# Patient Record
Sex: Female | Born: 1935 | Race: Black or African American | Hispanic: No | Marital: Single | Smoking: Former smoker
Health system: Southern US, Community
[De-identification: ages and names within clinical notes are randomized; demographics above are authoritative.]

## PROBLEM LIST (undated history)

## (undated) DIAGNOSIS — J45909 Unspecified asthma, uncomplicated: Secondary | ICD-10-CM

## (undated) DIAGNOSIS — I480 Paroxysmal atrial fibrillation: Secondary | ICD-10-CM

## (undated) DIAGNOSIS — I779 Disorder of arteries and arterioles, unspecified: Secondary | ICD-10-CM

## (undated) DIAGNOSIS — T7840XA Allergy, unspecified, initial encounter: Secondary | ICD-10-CM

## (undated) DIAGNOSIS — I10 Essential (primary) hypertension: Secondary | ICD-10-CM

## (undated) DIAGNOSIS — E785 Hyperlipidemia, unspecified: Secondary | ICD-10-CM

## (undated) HISTORY — DX: Paroxysmal atrial fibrillation: I48.0

## (undated) HISTORY — PX: GROIN DISSECTION: SUR421

## (undated) HISTORY — DX: Disorder of arteries and arterioles, unspecified: I77.9

## (undated) HISTORY — PX: INSERT / REPLACE / REMOVE PACEMAKER: SUR710

## (undated) HISTORY — PX: OTHER SURGICAL HISTORY: SHX169

---

## 2019-09-17 ENCOUNTER — Other Ambulatory Visit: Payer: Self-pay

## 2019-09-17 ENCOUNTER — Inpatient Hospital Stay (HOSPITAL_COMMUNITY)
Admission: EM | Admit: 2019-09-17 | Discharge: 2019-09-23 | DRG: 418 | Disposition: A | Discharge status: Home Health | Payer: Medicare Other | Attending: Family Medicine | Admitting: Family Medicine

## 2019-09-17 ENCOUNTER — Emergency Department (HOSPITAL_COMMUNITY): Payer: Medicare Other

## 2019-09-17 DIAGNOSIS — D638 Anemia in other chronic diseases classified elsewhere: Secondary | ICD-10-CM | POA: Diagnosis present

## 2019-09-17 DIAGNOSIS — Z20822 Contact with and (suspected) exposure to covid-19: Secondary | ICD-10-CM | POA: Diagnosis present

## 2019-09-17 DIAGNOSIS — E876 Hypokalemia: Secondary | ICD-10-CM | POA: Diagnosis present

## 2019-09-17 DIAGNOSIS — E785 Hyperlipidemia, unspecified: Secondary | ICD-10-CM | POA: Diagnosis present

## 2019-09-17 DIAGNOSIS — K567 Ileus, unspecified: Secondary | ICD-10-CM | POA: Diagnosis not present

## 2019-09-17 DIAGNOSIS — K810 Acute cholecystitis: Principal | ICD-10-CM | POA: Diagnosis present

## 2019-09-17 DIAGNOSIS — J45909 Unspecified asthma, uncomplicated: Secondary | ICD-10-CM | POA: Diagnosis present

## 2019-09-17 DIAGNOSIS — J4520 Mild intermittent asthma, uncomplicated: Secondary | ICD-10-CM | POA: Diagnosis present

## 2019-09-17 DIAGNOSIS — D649 Anemia, unspecified: Secondary | ICD-10-CM | POA: Diagnosis present

## 2019-09-17 DIAGNOSIS — I10 Essential (primary) hypertension: Secondary | ICD-10-CM | POA: Diagnosis present

## 2019-09-17 DIAGNOSIS — T7840XA Allergy, unspecified, initial encounter: Secondary | ICD-10-CM | POA: Diagnosis present

## 2019-09-17 DIAGNOSIS — E739 Lactose intolerance, unspecified: Secondary | ICD-10-CM | POA: Diagnosis present

## 2019-09-17 DIAGNOSIS — Z6832 Body mass index (BMI) 32.0-32.9, adult: Secondary | ICD-10-CM

## 2019-09-17 DIAGNOSIS — Z950 Presence of cardiac pacemaker: Secondary | ICD-10-CM

## 2019-09-17 DIAGNOSIS — E669 Obesity, unspecified: Secondary | ICD-10-CM | POA: Diagnosis present

## 2019-09-17 DIAGNOSIS — R350 Frequency of micturition: Secondary | ICD-10-CM | POA: Diagnosis present

## 2019-09-17 DIAGNOSIS — Z87891 Personal history of nicotine dependence: Secondary | ICD-10-CM

## 2019-09-17 DIAGNOSIS — E871 Hypo-osmolality and hyponatremia: Secondary | ICD-10-CM | POA: Diagnosis present

## 2019-09-17 DIAGNOSIS — L8990 Pressure ulcer of unspecified site, unspecified stage: Secondary | ICD-10-CM | POA: Insufficient documentation

## 2019-09-17 DIAGNOSIS — L89302 Pressure ulcer of unspecified buttock, stage 2: Secondary | ICD-10-CM | POA: Diagnosis present

## 2019-09-17 DIAGNOSIS — Z01810 Encounter for preprocedural cardiovascular examination: Secondary | ICD-10-CM | POA: Diagnosis not present

## 2019-09-17 HISTORY — DX: Acute cholecystitis: K81.0

## 2019-09-17 HISTORY — DX: Essential (primary) hypertension: I10

## 2019-09-17 HISTORY — DX: Unspecified asthma, uncomplicated: J45.909

## 2019-09-17 HISTORY — DX: Allergy, unspecified, initial encounter: T78.40XA

## 2019-09-17 HISTORY — DX: Hyperlipidemia, unspecified: E78.5

## 2019-09-17 LAB — BASIC METABOLIC PANEL
Anion gap: 11 (ref 5–15)
BUN: 20 mg/dL (ref 8–23)
CO2: 22 mmol/L (ref 22–32)
Calcium: 8.7 mg/dL — ABNORMAL LOW (ref 8.9–10.3)
Chloride: 94 mmol/L — ABNORMAL LOW (ref 98–111)
Creatinine, Ser: 1.13 mg/dL — ABNORMAL HIGH (ref 0.44–1.00)
GFR calc Af Amer: 49 mL/min — ABNORMAL LOW (ref >60)
GFR calc non Af Amer: 42 mL/min — ABNORMAL LOW (ref >60)
Glucose, Bld: 112 mg/dL — ABNORMAL HIGH (ref 70–99)
Potassium: 4.5 mmol/L (ref 3.5–5.1)
Sodium: 127 mmol/L — ABNORMAL LOW (ref 135–145)

## 2019-09-17 LAB — CBC WITH DIFFERENTIAL/PLATELET
Abs Immature Granulocytes: 0.18 10*3/uL — ABNORMAL HIGH (ref 0.00–0.07)
Basophils Absolute: 0 10*3/uL (ref 0.0–0.1)
Basophils Relative: 0 %
Eosinophils Absolute: 0 10*3/uL (ref 0.0–0.5)
Eosinophils Relative: 0 %
HCT: 34.3 % — ABNORMAL LOW (ref 36.0–46.0)
Hemoglobin: 11.6 g/dL — ABNORMAL LOW (ref 12.0–15.0)
Immature Granulocytes: 1 %
Lymphocytes Relative: 3 %
Lymphs Abs: 0.7 10*3/uL (ref 0.7–4.0)
MCH: 30.8 pg (ref 26.0–34.0)
MCHC: 33.8 g/dL (ref 30.0–36.0)
MCV: 91 fL (ref 80.0–100.0)
Monocytes Absolute: 0.9 10*3/uL (ref 0.1–1.0)
Monocytes Relative: 4 %
Neutro Abs: 20 10*3/uL — ABNORMAL HIGH (ref 1.7–7.7)
Neutrophils Relative %: 92 %
Platelets: 295 10*3/uL (ref 150–400)
RBC: 3.77 MIL/uL — ABNORMAL LOW (ref 3.87–5.11)
RDW: 12.8 % (ref 11.5–15.5)
WBC: 21.8 10*3/uL — ABNORMAL HIGH (ref 4.0–10.5)
nRBC: 0 % (ref 0.0–0.2)

## 2019-09-17 LAB — COMPREHENSIVE METABOLIC PANEL
ALT: 19 U/L (ref 0–44)
AST: 24 U/L (ref 15–41)
Albumin: 3.5 g/dL (ref 3.5–5.0)
Alkaline Phosphatase: 64 U/L (ref 38–126)
Anion gap: 13 (ref 5–15)
BUN: 17 mg/dL (ref 8–23)
CO2: 22 mmol/L (ref 22–32)
Calcium: 9.4 mg/dL (ref 8.9–10.3)
Chloride: 91 mmol/L — ABNORMAL LOW (ref 98–111)
Creatinine, Ser: 1.09 mg/dL — ABNORMAL HIGH (ref 0.44–1.00)
GFR calc Af Amer: 51 mL/min — ABNORMAL LOW (ref >60)
GFR calc non Af Amer: 44 mL/min — ABNORMAL LOW (ref >60)
Glucose, Bld: 161 mg/dL — ABNORMAL HIGH (ref 70–99)
Potassium: 4.1 mmol/L (ref 3.5–5.1)
Sodium: 126 mmol/L — ABNORMAL LOW (ref 135–145)
Total Bilirubin: 0.8 mg/dL (ref 0.3–1.2)
Total Protein: 6.8 g/dL (ref 6.5–8.1)

## 2019-09-17 LAB — OSMOLALITY, URINE: Osmolality, Ur: 632 mOsm/kg (ref 300–900)

## 2019-09-17 LAB — SARS CORONAVIRUS 2 BY RT PCR (HOSPITAL ORDER, PERFORMED IN ~~LOC~~ HOSPITAL LAB): SARS Coronavirus 2: NEGATIVE

## 2019-09-17 LAB — SODIUM, URINE, RANDOM: Sodium, Ur: 11 mmol/L

## 2019-09-17 LAB — OSMOLALITY: Osmolality: 272 mOsm/kg — ABNORMAL LOW (ref 275–295)

## 2019-09-17 LAB — LIPASE, BLOOD: Lipase: 27 U/L (ref 11–51)

## 2019-09-17 MED ORDER — IOHEXOL 300 MG/ML  SOLN
100.0000 mL | Freq: Once | INTRAMUSCULAR | Status: AC | PRN
  Administered 2019-09-17: 100 mL via INTRAVENOUS

## 2019-09-17 MED ORDER — ONDANSETRON HCL 4 MG/2ML IJ SOLN
4.0000 mg | Freq: Four times a day (QID) | INTRAMUSCULAR | Status: DC | PRN
  Administered 2019-09-17 – 2019-09-22 (×3): 4 mg via INTRAVENOUS
  Filled 2019-09-17 (×3): qty 2

## 2019-09-17 MED ORDER — ACETAMINOPHEN 325 MG PO TABS: 650.0000 mg | ORAL_TABLET | Freq: Four times a day (QID) | ORAL | Status: DC | PRN

## 2019-09-17 MED ORDER — MOMETASONE FURO-FORMOTEROL FUM 200-5 MCG/ACT IN AERO
2.0000 | INHALATION_SPRAY | Freq: Two times a day (BID) | RESPIRATORY_TRACT | Status: DC
  Administered 2019-09-17 – 2019-09-23 (×12): 2 via RESPIRATORY_TRACT
  Filled 2019-09-17: qty 8.8

## 2019-09-17 MED ORDER — SODIUM CHLORIDE 0.9 % IV SOLN
2.0000 g | Freq: Once | INTRAVENOUS | Status: AC
  Administered 2019-09-17: 2 g via INTRAVENOUS
  Filled 2019-09-17: qty 20

## 2019-09-17 MED ORDER — SODIUM CHLORIDE 0.9 % IV SOLN
2.0000 g | INTRAVENOUS | Status: DC
  Administered 2019-09-18 – 2019-09-23 (×6): 2 g via INTRAVENOUS
  Filled 2019-09-17 (×2): qty 2
  Filled 2019-09-17 (×4): qty 20
  Filled 2019-09-17: qty 2

## 2019-09-17 MED ORDER — ACETAMINOPHEN 650 MG RE SUPP: 650.0000 mg | Freq: Four times a day (QID) | RECTAL | Status: DC | PRN

## 2019-09-17 MED ORDER — DICLOFENAC SODIUM 1 % EX GEL
2.0000 g | Freq: Two times a day (BID) | CUTANEOUS | Status: DC | PRN
  Filled 2019-09-17: qty 100

## 2019-09-17 MED ORDER — AMLODIPINE BESYLATE 10 MG PO TABS
10.0000 mg | ORAL_TABLET | Freq: Every day | ORAL | Status: DC
  Administered 2019-09-17 – 2019-09-23 (×7): 10 mg via ORAL
  Filled 2019-09-17 (×7): qty 1

## 2019-09-17 MED ORDER — ENOXAPARIN SODIUM 30 MG/0.3ML ~~LOC~~ SOLN
30.0000 mg | SUBCUTANEOUS | Status: DC
  Administered 2019-09-18: 30 mg via SUBCUTANEOUS
  Filled 2019-09-17: qty 0.3

## 2019-09-17 MED ORDER — ONDANSETRON HCL 4 MG PO TABS: 4.0000 mg | ORAL_TABLET | Freq: Four times a day (QID) | ORAL | Status: DC | PRN

## 2019-09-17 MED ORDER — FENTANYL CITRATE (PF) 100 MCG/2ML IJ SOLN
12.5000 ug | INTRAMUSCULAR | Status: DC | PRN
  Administered 2019-09-19 – 2019-09-21 (×3): 12.5 ug via INTRAVENOUS
  Filled 2019-09-17 (×3): qty 2

## 2019-09-17 MED ORDER — ENOXAPARIN SODIUM 40 MG/0.4ML ~~LOC~~ SOLN
40.0000 mg | SUBCUTANEOUS | Status: DC
  Administered 2019-09-17: 40 mg via SUBCUTANEOUS
  Filled 2019-09-17: qty 0.4

## 2019-09-17 MED ORDER — SODIUM CHLORIDE 0.9 % IV SOLN: INTRAVENOUS | Status: DC

## 2019-09-17 NOTE — ED Notes (Signed)
Medicare card handed to Pt's Daughter  (Debora ) .

## 2019-09-17 NOTE — H&P (Signed)
History and Physical    Sarah Burns JWJ:[id number] DOB: Apr 25, 1924 DOA: 09/17/2019  I have briefly reviewed the patient's prior medical records in [facility]  PCP: Renaye Rakers, MD  Patient coming from: home with daughter  Chief Complaint: stomach troubles  HPI: Sarah Burns is a 86 y.o. female who for her age is in remarkable shape: reported pMHX of HTN, HLD, asthma, and pacemaker.  Until recently patient has been living in [place] on her.  Patient has now moved to [place] to be with her daughter.  Patient stated she developed abdominal pain on Friday night after having food from [employer] including a milkshake (patient states she is lactose intolerant).  Patient has continued to have abdominal issues including pain, nausea, and vomiting so she reported to the ER. In the ER, CT scan was done that shows acute cholecystitis.  Patient also has a 1 cm stone in the cystic duct.  Patient is white blood cell count was found to be markedly elevated at 21.9 and her sodium was 126.    Review of Systems: As per HPI otherwise 10 point review of systems negative.   No past medical history on file.- lactose intolerant, HTN, HLD, asthma, s/p pacemaker (patient is from the [place] area and daughter is not at bedside for verification.  Have left a message with daughter to see if we can import records from her prior hospitalizations in [place])     has no history on file for tobacco, alcohol, and drug.  Allergies  Allergen Reactions   Penicillins Other (See Comments)    Unknown reaction     Patient denies pertinent family history  Prior to Admission medications   Not on File    Physical Exam: Vitals:   09/17/19 0500 09/17/19 0515 09/17/19 0530 09/17/19 0724  BP: (!) 166/55 (!) 165/51 (!) 148/49 (!) 141/51  Pulse: 81 80 78 79  Resp:    17  SpO2: 96% 97% 97% 97%  Weight:      Height:          Constitutional: NAD, calm, comfortable Eyes: PERRL, lids and  conjunctivae normal ENMT: Dry mucous membranes Respiratory: clear to auscultation bilaterally, no wheezing, no crackles. Normal respiratory effort. No accessory muscle use.  Cardiovascular: Regular rate and rhythm, no murmurs / rubs / gallops. No extremity edema. 2+ pedal pulses.  Abdomen: Tenderness in the right upper quadrant and epigastric area with palpation. Bowel sounds positive.  Musculoskeletal: no clubbing / cyanosis. Skin: no rashes, lesions, ulcers. No induration Neurologic: CN 2-12 grossly intact.  Moves all 4 extremities Psychiatric: Normal judgment and insight. Alert and oriented x 3. Normal mood.   Labs on Admission: I have personally reviewed following labs and imaging studies  CBC: Recent Labs  Lab 09/17/19 0346  WBC 21.8*  NEUTROABS 20.0*  HGB 11.6*  HCT 34.3*  MCV 91.0  PLT 295   Basic Metabolic Panel: Recent Labs  Lab 09/17/19 0346  NA 126*  K 4.1  CL 91*  CO2 22  GLUCOSE 161*  BUN 17  CREATININE 1.09*  CALCIUM 9.4   GFR: Estimated Creatinine Clearance: 18.6 mL/min (A) (by C-G formula based on SCr of 1.09 mg/dL (H)). Liver Function Tests: Recent Labs  Lab 09/17/19 0346  AST 24  ALT 19  ALKPHOS 64  BILITOT 0.8  PROT 6.8  ALBUMIN 3.5   Recent Labs  Lab 09/17/19 0346  LIPASE 27   No results for input(s): AMMONIA in the last 168 hours. Coagulation  Profile: No results for input(s): INR, PROTIME in the last 168 hours. Cardiac Enzymes: No results for input(s): CKTOTAL, CKMB, CKMBINDEX, TROPONINI in the last 168 hours. BNP (last 3 results) No results for input(s): PROBNP in the last 8760 hours. HbA1C: No results for input(s): HGBA1C in the last 72 hours. CBG: No results for input(s): GLUCAP in the last 168 hours. Lipid Profile: No results for input(s): CHOL, HDL, LDLCALC, TRIG, CHOLHDL, LDLDIRECT in the last 72 hours. Thyroid Function Tests: No results for input(s): TSH, T4TOTAL, FREET4, T3FREE, THYROIDAB in the last 72 hours. Anemia  Panel: No results for input(s): VITAMINB12, FOLATE, FERRITIN, TIBC, IRON, RETICCTPCT in the last 72 hours. Urine analysis: No results found for: COLORURINE, APPEARANCEUR, LABSPEC, PHURINE, GLUCOSEU, HGBUR, BILIRUBINUR, KETONESUR, PROTEINUR, UROBILINOGEN, NITRITE, LEUKOCYTESUR   Radiological Exams on Admission: CT ABDOMEN PELVIS W CONTRAST  Result Date: 09/17/2019 CLINICAL DATA:  Pt. Is lactose intolerant and had ice cream and a little wine. Per EMS, pt. stomach started hurting and she started feeling sick. EXAM: CT ABDOMEN AND PELVIS WITH CONTRAST TECHNIQUE: Multidetector CT imaging of the abdomen and pelvis was performed using the standard protocol following bolus administration of intravenous contrast. CONTRAST:  OMNIPAQUE IOHEXOL 300 MG/ML  SOLN COMPARISON:  None. FINDINGS: Lower chest: Linear and reticular lower lobe opacities are noted consistent with chronic scarring/atelectasis. No acute lung base abnormalities. Hepatobiliary: 1 cm focus of enhancement in segment 7, nonspecific. No other liver mass or lesion. Liver normal in size and overall attenuation. Gallbladder is distended with wall thickening and adjacent inflammation. There is a 1 cm stone in the cystic duct. Common bile duct is normal in caliber for age. Pancreas: Unremarkable. No pancreatic ductal dilatation or surrounding inflammatory changes. Spleen: Normal in size without focal abnormality. Adrenals/Urinary Tract: No adrenal masses. Kidneys normal in size, orientation and position with symmetric enhancement and excretion. No renal masses, stones or hydronephrosis. Normal ureters. Normal bladder. Stomach/Bowel: Normal stomach. Small bowel and colon are normal in caliber. No wall thickening. No inflammation. Normal appendix visualized. Vascular/Lymphatic: Extensive aortic and iliac artery atherosclerosis. No aneurysm. No enlarged lymph nodes. Reproductive: Uterus and bilateral adnexa are unremarkable. Other: Trace amount of ascites,  with fluid attenuation adjacent to the gallbladder and collecting in the posterior pelvic recess. Musculoskeletal: No fracture or acute finding. No osteoblastic or osteolytic lesions. IMPRESSION: 1. Acute cholecystitis. Gallbladder is distended with wall thickening and adjacent inflammation. There is a 1 cm stone within the cystic duct. Trace amount of associated ascites. 2. No other acute abnormality within the abdomen or pelvis. 3. Extensive aortoiliac atherosclerosis. Electronically Signed   By: Amie Portland M.D.   On: 09/17/2019 06:29    EKG: Independently reviewed. I have ordered  Assessment/Plan Active Problems:   Acute cholecystitis    Acute cholecystitis -IV Abx -General surgery consultation: Will need to be decided if patient to go to operating room versus IR for drain -Have left message for daughter to clarify past medical history and to see if her old records from [place] can be pulled into chart through care everywhere -We will get echocardiogram and EKG to review prior to procedures as no records here   Hyponatremia -Most likely from vomiting -IV fluids -Have ordered urine studies as well as serum Osmo  Leukocytosis -Trend with daily CBCs -On antibiotics  Hypertension -Home meds have not been reconciled -Continue to monitor  DVT prophylaxis: lovenox  Code Status: full (discussed with patient but no family around)  Family Communication: LM for daughter Disposition  Plan: inpt Consults called: general surgery    Admission status: inpt   At the time of admission, it appears that the appropriate admission status for this patient is INPATIENT. This is judged to be reasonable and necessary in order to provide the required high service intensity to ensure the patient's safety given the presenting symptoms, physical exam findings, and initial radiographic and laboratory data in the context of their chronic comorbidities. Current circumstances are an acute infection  and diminished PO status, and it is felt to place patient at high risk for further clinical deterioration threatening life, limb, or organ. Moreover, it is my clinical judgment that the patient will require inpatient hospital care spanning beyond 2 midnights from the point of admission and that early discharge would result in unnecessary risk of decompensation and readmission or threat to life, limb or bodily function.   Geradine Girt [facility]   How to contact the [facility] Attending or Consulting provider [place] or covering provider during after hours [place], for this patient?  1. Check the care team in [facility] and look for a) attending/consulting TR[facility] provider listed and b) the [facility] team listed 2. Log into [email or web address] and use [place]'s universal password to access. If you do not have the password, please contact the hospital operator. 3. Locate the [facility] provider you are looking for under [facility] and page to a number that you can be directly reached. 4. If you still have difficulty reaching the provider, please page the [facility] (Director on Call) for the Hospitalists listed on amion for assistance.  09/17/2019, 7:36 AM

## 2019-09-17 NOTE — Consult Note (Signed)
[facility] Surgery Consult Note  Sarah Burns 07/25/1924  [id number].    Requesting MD: Dione Booze Chief Complaint/Reason for Consult: cholecystitis  HPI:  Sarah Burns is a 86yo female PMH HTN, HLD, asthma, s/p pacemaker who presented to [facility] early this morning with worsening abdominal pain. States that it started Friday night after eating chicken salad sandwich and milkshake from [employer]. Initially thought that it was from the dairy because she is lactose intolerant. Pain is central. Associated with anorexia and multiple episodes of nausea and vomiting. Denies fever, chills, diarrhea, dysuria, CP, or SOB. She has never had pain like this before.   ED work up included CT scan which shows acute cholecystitis, gallbladder is distended with wall thickening and adjacent inflammation, there is a 1 cm stone within the cystic duct and trace amount of associated ascites. WBC 21.9, lipase and LFTs WNL, Cr 1.09, Na 126, Cl 91. Temp 99.6. General surgery asked to see.  Abdominal surgical history: none Anticoagulants: none Nonsmoker Lives at home with her daughter, recently moved from [place] where she lived independently  Ambulates with a cane, fairly sedentary throughout the day  Review of Systems  Constitutional: Negative.   HENT: Negative.   Eyes: Negative.   Respiratory: Negative.   Cardiovascular: Negative.   Gastrointestinal: Positive for abdominal pain, nausea and vomiting. Negative for constipation and diarrhea.  Genitourinary: Negative.   Musculoskeletal: Negative.   Skin: Negative.   Neurological: Negative.    All systems reviewed and otherwise negative except for as above  No family history on file.  No past medical history on file.  Social History:  has no history on file for tobacco, alcohol, and drug.  Allergies:  Allergies  Allergen Reactions  . Penicillins Other (See Comments)    Unknown reaction     (Not in a hospital admission)   Prior to  Admission medications   Not on File    Blood pressure (!) 141/51, pulse 79, temperature 99.6 F (37.6 C), temperature source Oral, resp. rate 17, height 4\' 3"  (1.295 m), weight 54.4 kg, SpO2 97 %. Physical Exam: General: pleasant, frail black female who is laying in bed in NAD HEENT: head is normocephalic, atraumatic.  Sclera are noninjected.  PERRL.  Ears and nose without any masses or lesions.  Mouth is pink and moist. Dentition fair Heart: regular, rate, and rhythm.  Normal s1,s2. No obvious murmurs, gallops, or rubs noted.  Feet WWP bilaterally but pedal pulses difficult to palpate Lungs: CTAB, no wheezes, rhonchi, or rales noted.  Respiratory effort nonlabored Abd: soft, ND, +BS, no masses, hernias, or organomegaly. Mild/mod TTP RUQ and epigastric region without rebound or guarding MS: no BUE/BLE edema, calves soft and nontender Skin: warm and dry with no masses, lesions, or rashes Psych: A&Ox4 with an appropriate affect Neuro: cranial nerves grossly intact, equal strength in BUE/BLE bilaterally, normal speech, thought process intact  Results for orders placed or performed during the hospital encounter of 09/17/19 (from the past 48 hour(s))  Comprehensive metabolic panel     Status: Abnormal   Collection Time: 09/17/19  3:46 AM  Result Value Ref Range   Sodium 126 (L) 135 - 145 mmol/L   Potassium 4.1 3.5 - 5.1 mmol/L   Chloride 91 (L) 98 - 111 mmol/L   CO2 22 22 - 32 mmol/L   Glucose, Bld 161 (H) 70 - 99 mg/dL    Comment: Glucose reference range applies only to samples taken after fasting for at least 8 hours.  BUN 17 8 - 23 mg/dL   Creatinine, Ser 5.91 (H) 0.44 - 1.00 mg/dL   Calcium 9.4 8.9 - 63.8 mg/dL   Total Protein 6.8 6.5 - 8.1 g/dL   Albumin 3.5 3.5 - 5.0 g/dL   AST 24 15 - 41 U/L   ALT 19 0 - 44 U/L   Alkaline Phosphatase 64 38 - 126 U/L   Total Bilirubin 0.8 0.3 - 1.2 mg/dL   GFR calc non Af Amer 44 (L) >60 mL/min   GFR calc Af Amer 51 (L) >60 mL/min   Anion gap  13 5 - 15    Comment: Performed at [facility], [address] [place], [place] [address]  Lipase, blood     Status: None   Collection Time: 09/17/19  3:46 AM  Result Value Ref Range   Lipase 27 11 - 51 U/L    Comment: Performed at [facility], [address], [place], [place] [address]  CBC with Differential     Status: Abnormal   Collection Time: 09/17/19  3:46 AM  Result Value Ref Range   WBC 21.8 (H) 4.0 - 10.5 K/uL   RBC 3.77 (L) 3.87 - 5.11 MIL/uL   Hemoglobin 11.6 (L) 12.0 - 15.0 g/dL   HCT 17.7 (L) 93.9 - 03.0 %   MCV 91.0 80.0 - 100.0 fL   MCH 30.8 26.0 - 34.0 pg   MCHC 33.8 30.0 - 36.0 g/dL   RDW 09.2 33.0 - 07.6 %   Platelets 295 150 - 400 K/uL   nRBC 0.0 0.0 - 0.2 %   Neutrophils Relative % 92 %   Neutro Abs 20.0 (H) 1.7 - 7.7 K/uL   Lymphocytes Relative 3 %   Lymphs Abs 0.7 0.7 - 4.0 K/uL   Monocytes Relative 4 %   Monocytes Absolute 0.9 0.1 - 1.0 K/uL   Eosinophils Relative 0 %   Eosinophils Absolute 0.0 0.0 - 0.5 K/uL   Basophils Relative 0 %   Basophils Absolute 0.0 0.0 - 0.1 K/uL   Immature Granulocytes 1 %   Abs Immature Granulocytes 0.18 (H) 0.00 - 0.07 K/uL    Comment: Performed at [facility], [address], [place], [place] [address]   CT ABDOMEN PELVIS W CONTRAST  Result Date: 09/17/2019 CLINICAL DATA:  Pt. Is lactose intolerant and had ice cream and a little wine. Per EMS, pt. stomach started hurting and she started feeling sick. EXAM: CT ABDOMEN AND PELVIS WITH CONTRAST TECHNIQUE: Multidetector CT imaging of the abdomen and pelvis was performed using the standard protocol following bolus administration of intravenous contrast. CONTRAST:  OMNIPAQUE IOHEXOL 300 MG/ML  SOLN COMPARISON:  None. FINDINGS: Lower chest: Linear and reticular lower lobe opacities are noted consistent with chronic scarring/atelectasis. No acute lung base abnormalities. Hepatobiliary: 1 cm focus of enhancement in segment 7, nonspecific. No other liver mass  or lesion. Liver normal in size and overall attenuation. Gallbladder is distended with wall thickening and adjacent inflammation. There is a 1 cm stone in the cystic duct. Common bile duct is normal in caliber for age. Pancreas: Unremarkable. No pancreatic ductal dilatation or surrounding inflammatory changes. Spleen: Normal in size without focal abnormality. Adrenals/Urinary Tract: No adrenal masses. Kidneys normal in size, orientation and position with symmetric enhancement and excretion. No renal masses, stones or hydronephrosis. Normal ureters. Normal bladder. Stomach/Bowel: Normal stomach. Small bowel and colon are normal in caliber. No wall thickening. No inflammation. Normal appendix visualized. Vascular/Lymphatic:  Extensive aortic and iliac artery atherosclerosis. No aneurysm. No enlarged lymph nodes. Reproductive: Uterus and bilateral adnexa are unremarkable. Other: Trace amount of ascites, with fluid attenuation adjacent to the gallbladder and collecting in the posterior pelvic recess. Musculoskeletal: No fracture or acute finding. No osteoblastic or osteolytic lesions. IMPRESSION: 1. Acute cholecystitis. Gallbladder is distended with wall thickening and adjacent inflammation. There is a 1 cm stone within the cystic duct. Trace amount of associated ascites. 2. No other acute abnormality within the abdomen or pelvis. 3. Extensive aortoiliac atherosclerosis. Electronically Signed   By: Lajean Manes M.D.   On: 09/17/2019 06:29      Assessment/Plan HTN HLD Asthma S/p pacemaker  Acute cholecystitis  - Patient with acute cholecystitis and WBC 21.8. LFTs are WNL and her vital signs are stable. She is tender RUQ/epigastric region on exam. We discussed treatment options including cholecystectomy, percutaneous cholecystostomy tube, versus antibiotics alone. Also discussed with Dr. Eliseo Squires who will obtain further work up included EKG and ECHO to determine if she is a candidate for surgery, and will see if  we can obtain any medical records from [place] where she recently moved from. Keep NPO for now and continue IV rocephin.   ID - rocephin VTE - SCDs FEN - IVF, NPO Foley - none Follow up - TBD  [place]ellington [place], [facility] Surgery 09/17/2019, 8:02 AM Please see Amion for pager number during day hours 7:00am-4:30pm

## 2019-09-17 NOTE — ED Notes (Signed)
Report given to 5c 

## 2019-09-17 NOTE — ED Provider Notes (Signed)
[facility] EMERGENCY DEPARTMENT Provider Note   CSN: [id number] Arrival date & time: 09/17/19  0247   History Chief Complaint  Patient presents with  . Emesis    Sarah Burns is a 86 y.o. female.  The history is provided by the patient and the EMS personnel.  Emesis She is lactose intolerance and apparently developed some abdominal cramping after eating some ice cream and drinking a little bit of wine.  There is no nausea, vomiting, diarrhea.  Cramping has subsided, but patient's daughter wanted her to be evaluated.  She currently has no complaints.  No past medical history on file.  There are no problems to display for this patient.   ** The histories are not reviewed yet. Please review them in the "History" navigator section and refresh this SmartLink.   OB History   No obstetric history on file.     No family history on file.  Social History   Tobacco Use  . Smoking status: Not on file  Substance Use Topics  . Alcohol use: Not on file  . Drug use: Not on file    Home Medications Prior to Admission medications   Not on File    Allergies    Patient has no allergy information on record.  Review of Systems   Review of Systems  Gastrointestinal: Positive for vomiting.  All other systems reviewed and are negative.   Physical Exam Updated Vital Signs BP (!) 154/49 (BP Location: Right Arm)   Pulse 88   Resp 17   Ht 4\' 3"  (1.295 m)   SpO2 100%   Physical Exam Vitals and nursing note reviewed.   86 year old female, resting comfortably and in no acute distress. Vital signs are significant for elevated blood pressure. Oxygen saturation is 100%, which is normal. Head is normocephalic and atraumatic. PERRLA, EOMI. Oropharynx is clear. Neck is nontender and supple without adenopathy or JVD. Back is nontender and there is no CVA tenderness. Lungs are clear without rales, wheezes, or rhonchi. Chest is nontender. Heart has regular rate and  rhythm without murmur. Abdomen is soft, flat, nontender without masses or hepatosplenomegaly and peristalsis is hypoactive. Extremities have no cyanosis or edema, full range of motion is present. Skin is warm and dry without rash. Neurologic: Mental status is normal, cranial nerves are intact, there are no motor or sensory deficits.  ED Results / Procedures / Treatments   Labs (all labs ordered are listed, but only abnormal results are displayed) Labs Reviewed  COMPREHENSIVE METABOLIC PANEL - Abnormal; Notable for the following components:      Result Value   Sodium 126 (*)    Chloride 91 (*)    Glucose, Bld 161 (*)    Creatinine, Ser 1.09 (*)    GFR calc non Af Amer 44 (*)    GFR calc Af Amer 51 (*)    All other components within normal limits  CBC WITH DIFFERENTIAL/PLATELET - Abnormal; Notable for the following components:   WBC 21.8 (*)    RBC 3.77 (*)    Hemoglobin 11.6 (*)    HCT 34.3 (*)    Neutro Abs 20.0 (*)    Abs Immature Granulocytes 0.18 (*)    All other components within normal limits  SARS CORONAVIRUS 2 BY RT PCR (HOSPITAL ORDER, PERFORMED IN [facility])  LIPASE, BLOOD   Imaging Results CT ABDOMEN PELVIS W CONTRAST  Result Date: 09/17/2019 CLINICAL DATA:  Pt. Is lactose intolerant and had  ice cream and a little wine. Per EMS, pt. stomach started hurting and she started feeling sick. EXAM: CT ABDOMEN AND PELVIS WITH CONTRAST TECHNIQUE: Multidetector CT imaging of the abdomen and pelvis was performed using the standard protocol following bolus administration of intravenous contrast. CONTRAST:  166mL OMNIPAQUE IOHEXOL 300 MG/ML  SOLN COMPARISON:  None. FINDINGS: Lower chest: Linear and reticular lower lobe opacities are noted consistent with chronic scarring/atelectasis. No acute lung base abnormalities. Hepatobiliary: 1 cm focus of enhancement in segment 7, nonspecific. No other liver mass or lesion. Liver normal in size and overall attenuation. Gallbladder  is distended with wall thickening and adjacent inflammation. There is a 1 cm stone in the cystic duct. Common bile duct is normal in caliber for age. Pancreas: Unremarkable. No pancreatic ductal dilatation or surrounding inflammatory changes. Spleen: Normal in size without focal abnormality. Adrenals/Urinary Tract: No adrenal masses. Kidneys normal in size, orientation and position with symmetric enhancement and excretion. No renal masses, stones or hydronephrosis. Normal ureters. Normal bladder. Stomach/Bowel: Normal stomach. Small bowel and colon are normal in caliber. No wall thickening. No inflammation. Normal appendix visualized. Vascular/Lymphatic: Extensive aortic and iliac artery atherosclerosis. No aneurysm. No enlarged lymph nodes. Reproductive: Uterus and bilateral adnexa are unremarkable. Other: Trace amount of ascites, with fluid attenuation adjacent to the gallbladder and collecting in the posterior pelvic recess. Musculoskeletal: No fracture or acute finding. No osteoblastic or osteolytic lesions. IMPRESSION: 1. Acute cholecystitis. Gallbladder is distended with wall thickening and adjacent inflammation. There is a 1 cm stone within the cystic duct. Trace amount of associated ascites. 2. No other acute abnormality within the abdomen or pelvis. 3. Extensive aortoiliac atherosclerosis. Electronically Signed   By: Lajean Manes M.D.   On: 09/17/2019 06:29    Procedures Procedures   Medications Ordered in ED Medications  cefTRIAXone (ROCEPHIN) 2 g in sodium chloride 0.9 % 100 mL IVPB (2 g Intravenous New Bag/Given 09/17/19 0721)  iohexol (OMNIPAQUE) 300 MG/ML solution 100 mL (100 mLs Intravenous Contrast Given 09/17/19 0542)    ED Course  I have reviewed the triage vital signs and the nursing notes.  Pertinent lab results that were available during my care of the patient were reviewed by me and considered in my medical decision making (see chart for details).  MDM  Rules/Calculators/A&P Abdominal cramping which has resolved.  This likely is secondary to lactose intolerance.  Exam is benign.  She has no prior records available.  Will check screening labs and observe in the ED.  4:25 AM Labs show elevated WBC of 21.8 with left shift.  Also, she is noted to have sodium of 126, reason is unclear.  Given markedly elevated WBC, will send for CT of abdomen and pelvis.  CT scan shows acute cholecystitis.  She is given a dose of ceftriaxone.  Case is discussed with Dr. Barry Dienes of general surgery who requests patient be admitted to hospitalist, surgery will see in consult.  Case is discussed with Dr. Eliseo Squires [facility] who agrees to admit the patient.  Final Clinical Impression(s) / ED Diagnoses Final diagnoses:  Acute cholecystitis  Hyponatremia  Normochromic normocytic anemia    Rx / DC Orders ED Discharge Orders    None       Delora Fuel, MD 88/41/66 [phone]

## 2019-09-17 NOTE — ED Triage Notes (Addendum)
Pt. Is lactose intolerant and had ice cream and a little wine. Per EMS, pt. stomach started hurting and she started feeling sick. Pt. A&O x4 Denies stomach discomfort now

## 2019-09-18 ENCOUNTER — Encounter (HOSPITAL_COMMUNITY): Payer: Self-pay | Admitting: Internal Medicine

## 2019-09-18 ENCOUNTER — Inpatient Hospital Stay (HOSPITAL_COMMUNITY): Payer: Medicare Other

## 2019-09-18 DIAGNOSIS — I10 Essential (primary) hypertension: Secondary | ICD-10-CM | POA: Diagnosis present

## 2019-09-18 DIAGNOSIS — L8990 Pressure ulcer of unspecified site, unspecified stage: Secondary | ICD-10-CM | POA: Insufficient documentation

## 2019-09-18 DIAGNOSIS — J45909 Unspecified asthma, uncomplicated: Secondary | ICD-10-CM | POA: Diagnosis present

## 2019-09-18 DIAGNOSIS — Z01810 Encounter for preprocedural cardiovascular examination: Secondary | ICD-10-CM

## 2019-09-18 DIAGNOSIS — D649 Anemia, unspecified: Secondary | ICD-10-CM | POA: Diagnosis present

## 2019-09-18 DIAGNOSIS — E871 Hypo-osmolality and hyponatremia: Secondary | ICD-10-CM | POA: Diagnosis present

## 2019-09-18 DIAGNOSIS — T7840XA Allergy, unspecified, initial encounter: Secondary | ICD-10-CM | POA: Diagnosis present

## 2019-09-18 DIAGNOSIS — E785 Hyperlipidemia, unspecified: Secondary | ICD-10-CM | POA: Diagnosis present

## 2019-09-18 LAB — URINALYSIS, ROUTINE W REFLEX MICROSCOPIC
Bacteria, UA: NONE SEEN
Bilirubin Urine: NEGATIVE
Glucose, UA: NEGATIVE mg/dL
Ketones, ur: NEGATIVE mg/dL
Leukocytes,Ua: NEGATIVE
Nitrite: NEGATIVE
Protein, ur: >=300 mg/dL — AB
Specific Gravity, Urine: 1.018 (ref 1.005–1.030)
pH: 6 (ref 5.0–8.0)

## 2019-09-18 LAB — CBC
HCT: 29 % — ABNORMAL LOW (ref 36.0–46.0)
Hemoglobin: 9.7 g/dL — ABNORMAL LOW (ref 12.0–15.0)
MCH: 30.8 pg (ref 26.0–34.0)
MCHC: 33.4 g/dL (ref 30.0–36.0)
MCV: 92.1 fL (ref 80.0–100.0)
Platelets: 230 10*3/uL (ref 150–400)
RBC: 3.15 MIL/uL — ABNORMAL LOW (ref 3.87–5.11)
RDW: 13 % (ref 11.5–15.5)
WBC: 20.4 10*3/uL — ABNORMAL HIGH (ref 4.0–10.5)
nRBC: 0 % (ref 0.0–0.2)

## 2019-09-18 LAB — RETICULOCYTES
Immature Retic Fract: 8.2 % (ref 2.3–15.9)
RBC.: 3.11 MIL/uL — ABNORMAL LOW (ref 3.87–5.11)
Retic Count, Absolute: 45.7 10*3/uL (ref 19.0–186.0)
Retic Ct Pct: 1.5 % (ref 0.4–3.1)

## 2019-09-18 LAB — COMPREHENSIVE METABOLIC PANEL
ALT: 16 U/L (ref 0–44)
AST: 18 U/L (ref 15–41)
Albumin: 2.7 g/dL — ABNORMAL LOW (ref 3.5–5.0)
Alkaline Phosphatase: 65 U/L (ref 38–126)
Anion gap: 9 (ref 5–15)
BUN: 16 mg/dL (ref 8–23)
CO2: 21 mmol/L — ABNORMAL LOW (ref 22–32)
Calcium: 8.4 mg/dL — ABNORMAL LOW (ref 8.9–10.3)
Chloride: 98 mmol/L (ref 98–111)
Creatinine, Ser: 1.01 mg/dL — ABNORMAL HIGH (ref 0.44–1.00)
GFR calc Af Amer: 56 mL/min — ABNORMAL LOW (ref >60)
GFR calc non Af Amer: 48 mL/min — ABNORMAL LOW (ref >60)
Glucose, Bld: 123 mg/dL — ABNORMAL HIGH (ref 70–99)
Potassium: 3.8 mmol/L (ref 3.5–5.1)
Sodium: 128 mmol/L — ABNORMAL LOW (ref 135–145)
Total Bilirubin: 0.9 mg/dL (ref 0.3–1.2)
Total Protein: 5.8 g/dL — ABNORMAL LOW (ref 6.5–8.1)

## 2019-09-18 LAB — IRON AND TIBC
Iron: 11 ug/dL — ABNORMAL LOW (ref 28–170)
Saturation Ratios: 5 % — ABNORMAL LOW (ref 10.4–31.8)
TIBC: 223 ug/dL — ABNORMAL LOW (ref 250–450)
UIBC: 212 ug/dL

## 2019-09-18 LAB — FERRITIN: Ferritin: 138 ng/mL (ref 11–307)

## 2019-09-18 LAB — FOLATE: Folate: 8.7 ng/mL (ref >5.9)

## 2019-09-18 LAB — ECHOCARDIOGRAM COMPLETE
Height: 51 in
Weight: 1920 oz

## 2019-09-18 LAB — VITAMIN B12: Vitamin B-12: 259 pg/mL (ref 180–914)

## 2019-09-18 NOTE — Progress Notes (Signed)
Echocardiogram 2D Echocardiogram has been performed.  Sarah Burns 09/18/2019, 11:32 AM

## 2019-09-18 NOTE — Progress Notes (Addendum)
Progress Note    Sarah Burns  PXT:[id number] DOB: 01/29/1925  DOA: 09/17/2019 PCP: Renaye Rakers, MD    Brief Narrative:     Medical records reviewed and are as summarized below:  Sarah Burns is a very pleasant 86 y.o. female with a past medical history that includes hypertension, hyperlipidemia, asthma, pacemaker admitted 6/6 from home with a chief complaint abdominal pain/nausea and vomiting.  Work-up in the emergency department included a CT scan that noted acute cholecystitis.  Patient also has a 1 cm stone in the cystic duct.  Lab work reveals hyponatremia leukocytosis.  General surgery consulted and plan is either IR vs OR.    Assessment/Plan:   Principal Problem:   Acute cholecystitis Active Problems:   Hyponatremia   Pressure injury of skin   Hypertension   Anemia   Asthma   Allergies   Hyperlipidemia  #1.  Acute cholecystitis.  Patient denies any abdominal pain and reports that her stomach feels "empty".  Denies nausea or vomiting.  EKG atrial sensed ventricular paced rhythm.  Await general surgery recommendations. The patient reports he is leaning toward surgery versus drain.  Rocephin was initiated.  She is afebrile hemodynamically stable and nontoxic-appearing -Continue Rocephin -N.p.o. for now -Gentle IV fluids -Family providing past medical history from [place]. -Await echocardiogram  #2.  Hyponatremia.  Sodium level remains stable at 127.  This is likely related to #1 in the setting of nausea vomiting decreased oral intake.  She has been receiving gentle IV.  Urine sodium and osmolality within the limits of normal.  Serum osmolality 272. -Continue IV fluids -Monitor -Review past medical history records from [place]  #3.  Leukocytosis. WBCs remain greater than 20. She is afebrile hemodynamically stable and nontoxic-appearing.  Rocephin was initiated upon admission. -Continue Rocephin -follow urinealysis -Await surgery recommendations -Check in the morning  4.   Hypertension.  Fair control.  Home medications include Norvasc and losartan. -Continuing Norvasc -Losartan for now -Monitor  5.  Asthma.  Medications include inhalers.  Stable at baseline  7.  Anemia.  Hg. dropped from 11-9.  Likely delusional.  Likely has a history of anemia.  Medications do not include the supplement. -Anemia panel -Monitor  #8.  Hyperlipidemia. -Statin  #9.  Seasonal allergies.  Stable at baseline    Family Communication/Anticipated D/C date and plan/Code Status   DVT prophylaxis: Lovenox ordered. Code Status: Full Code.  Family Communication: patient at bedside Disposition Plan: Status is: Inpatient  Remains inpatient appropriate because:IV treatments appropriate due to intensity of illness or inability to take PO   Dispo: The patient is from: Home              Anticipated d/c is to: Home              Anticipated d/c date is: 2 days              Patient currently is not medically stable to d/c.         Medical Consultants:   General surgery   Anti-Infectives:   Rocephin 6/6>>  Subjective:   Awake alert reports not having slept last night.  Denies abdominal pain nausea or vomiting.  Complains of feeling hungry  Objective:    Vitals:   09/17/19 1843 09/17/19 2310 09/18/19 0643 09/18/19 0912  BP: (!) 162/63 (!) 151/51 (!) 143/46   Pulse: 91 86 84   Resp: 18 19 16    Temp: 99.2 F (37.3 C) 98.2 F (36.8 C) 98.1  F (36.7 C)   TempSrc: Oral Oral Oral   SpO2: 96% 98% 95% 96%  Weight:      Height:        Intake/Output Summary (Last 24 hours) at 09/18/2019 0915 Last data filed at 09/17/2019 2200 Gross per 24 hour  Intake 922.5 ml  Output --  Net 922.5 ml   Filed Weights   09/17/19 0304  Weight: 54.4 kg    Exam: General: Awake alert no acute distress HEENT.  Membranes are pink but slightly dry. CV: Regular rate and rhythm I hear no murmur gallop or rub no lower extremity edema Respiratory: No increased work of breathing  breath sounds are clear to auscultation bilaterally I hear no wheeze no crackles Abdomen: Soft positive bowel sounds mild diffuse tenderness particularly in the upper right quadrant.  No guarding or rebounding Musculoskeletal: Joints without swelling/erythema full range of motion Neuro: Alert and oriented x3 speech clear facial symmetry moves all extremities spontaneously  Data Reviewed:   I have personally reviewed following labs and imaging studies:  Labs: Labs show the following:   Basic Metabolic Panel: Recent Labs  Lab 09/17/19 0346 09/17/19 0346 09/17/19 1543 09/18/19 0751  NA 126*  --  127* 128*  K 4.1   < > 4.5 3.8  CL 91*  --  94* 98  CO2 22  --  22 21*  GLUCOSE 161*  --  112* 123*  BUN 17  --  20 16  CREATININE 1.09*  --  1.13* 1.01*  CALCIUM 9.4  --  8.7* 8.4*   < > = values in this interval not displayed.   GFR Estimated Creatinine Clearance: 20.1 mL/min (A) (by C-G formula based on SCr of 1.01 mg/dL (H)). Liver Function Tests: Recent Labs  Lab 09/17/19 0346 09/18/19 0751  AST 24 18  ALT 19 16  ALKPHOS 64 65  BILITOT 0.8 0.9  PROT 6.8 5.8*  ALBUMIN 3.5 2.7*   Recent Labs  Lab 09/17/19 0346  LIPASE 27   No results for input(s): AMMONIA in the last 168 hours. Coagulation profile No results for input(s): INR, PROTIME in the last 168 hours.  CBC: Recent Labs  Lab 09/17/19 0346 09/18/19 0751  WBC 21.8* 20.4*  NEUTROABS 20.0*  --   HGB 11.6* 9.7*  HCT 34.3* 29.0*  MCV 91.0 92.1  PLT 295 230   Cardiac Enzymes: No results for input(s): CKTOTAL, CKMB, CKMBINDEX, TROPONINI in the last 168 hours. BNP (last 3 results) No results for input(s): PROBNP in the last 8760 hours. CBG: No results for input(s): GLUCAP in the last 168 hours. D-Dimer: No results for input(s): DDIMER in the last 72 hours. Hgb A1c: No results for input(s): HGBA1C in the last 72 hours. Lipid Profile: No results for input(s): CHOL, HDL, LDLCALC, TRIG, CHOLHDL, LDLDIRECT in  the last 72 hours. Thyroid function studies: No results for input(s): TSH, T4TOTAL, T3FREE, THYROIDAB in the last 72 hours.  Invalid input(s): FREET3 Anemia work up: No results for input(s): VITAMINB12, FOLATE, FERRITIN, TIBC, IRON, RETICCTPCT in the last 72 hours. Sepsis Labs: Recent Labs  Lab 09/17/19 0346 09/18/19 0751  WBC 21.8* 20.4*    Microbiology Recent Results (from the past 240 hour(s))  SARS Coronavirus 2 by RT PCR (hospital order, performed in [facility] hospital lab) Nasopharyngeal Nasopharyngeal Swab     Status: None   Collection Time: 09/17/19  7:28 AM   Specimen: Nasopharyngeal Swab  Result Value Ref Range Status   SARS Coronavirus 2 NEGATIVE  NEGATIVE Final    Comment: (NOTE) SARS-CoV-2 target nucleic acids are NOT DETECTED. The SARS-CoV-2 RNA is generally detectable in upper and lower respiratory specimens during the acute phase of infection. The lowest concentration of SARS-CoV-2 viral copies this assay can detect is 250 copies / mL. A negative result does not preclude SARS-CoV-2 infection and should not be used as the sole basis for treatment or other patient management decisions.  A negative result may occur with improper specimen collection / handling, submission of specimen other than nasopharyngeal swab, presence of viral mutation(s) within the areas targeted by this assay, and inadequate number of viral copies (<250 copies / mL). A negative result must be combined with clinical observations, patient history, and epidemiological information. Fact Sheet for Patients:   BoilerBrush.com.cy Fact Sheet for Healthcare Providers: https://pope.com/ This test is not yet approved or cleared  by the [place] FDA and has been authorized for detection and/or diagnosis of SARS-CoV-2 by FDA under an Emergency Use Authorization (EUA).  This EUA will remain in effect (meaning this test can be used) for the duration  of the COVID-19 declaration under Section 564(b)(1) of the Act, 21 U.S.C. section 360bbb-3(b)(1), unless the authorization is terminated or revoked sooner. Performed at [facility], [address] [place], [place] [address]     Procedures and diagnostic studies:  CT ABDOMEN PELVIS W CONTRAST  Result Date: 09/17/2019 CLINICAL DATA:  Pt. Is lactose intolerant and had ice cream and a little wine. Per EMS, pt. stomach started hurting and she started feeling sick. EXAM: CT ABDOMEN AND PELVIS WITH CONTRAST TECHNIQUE: Multidetector CT imaging of the abdomen and pelvis was performed using the standard protocol following bolus administration of intravenous contrast. CONTRAST:  OMNIPAQUE IOHEXOL 300 MG/ML  SOLN COMPARISON:  None. FINDINGS: Lower chest: Linear and reticular lower lobe opacities are noted consistent with chronic scarring/atelectasis. No acute lung base abnormalities. Hepatobiliary: 1 cm focus of enhancement in segment 7, nonspecific. No other liver mass or lesion. Liver normal in size and overall attenuation. Gallbladder is distended with wall thickening and adjacent inflammation. There is a 1 cm stone in the cystic duct. Common bile duct is normal in caliber for age. Pancreas: Unremarkable. No pancreatic ductal dilatation or surrounding inflammatory changes. Spleen: Normal in size without focal abnormality. Adrenals/Urinary Tract: No adrenal masses. Kidneys normal in size, orientation and position with symmetric enhancement and excretion. No renal masses, stones or hydronephrosis. Normal ureters. Normal bladder. Stomach/Bowel: Normal stomach. Small bowel and colon are normal in caliber. No wall thickening. No inflammation. Normal appendix visualized. Vascular/Lymphatic: Extensive aortic and iliac artery atherosclerosis. No aneurysm. No enlarged lymph nodes. Reproductive: Uterus and bilateral adnexa are unremarkable. Other: Trace amount of ascites, with fluid attenuation adjacent to  the gallbladder and collecting in the posterior pelvic recess. Musculoskeletal: No fracture or acute finding. No osteoblastic or osteolytic lesions. IMPRESSION: 1. Acute cholecystitis. Gallbladder is distended with wall thickening and adjacent inflammation. There is a 1 cm stone within the cystic duct. Trace amount of associated ascites. 2. No other acute abnormality within the abdomen or pelvis. 3. Extensive aortoiliac atherosclerosis. Electronically Signed   By: Amie Portland M.D.   On: 09/17/2019 06:29    Medications:    amLODipine  10 mg Oral Daily   enoxaparin (LOVENOX) injection  30 mg Subcutaneous Q24H   mometasone-formoterol  2 puff Inhalation BID   Continuous Infusions:  sodium chloride 75 mL/hr at 09/17/19 1142   cefTRIAXone (ROCEPHIN)  IV 2 g (09/18/19  6767)     LOS: 1 day   Radene Gunning NP  [facility]   How to contact the [facility] Attending or Consulting provider [place] or covering provider during after hours [place], for this patient?  Check the care team in [facility] and look for a) attending/consulting [facility] provider listed and b) the [facility] team listed Log into [email or web address] and use [place] universal password to access. If you do not have the password, please contact the hospital operator. Locate the [facility] provider you are looking for under [facility] and page to a number that you can be directly reached. If you still have difficulty reaching the provider, please page the [facility] (Director on Call) for the Hospitalists listed on amion for assistance.  09/18/2019, 9:15 AM

## 2019-09-18 NOTE — Progress Notes (Addendum)
[facility] [place] [facility] Progress Note     Subjective: CC-  Continues to have some mild RUQ pain, about the same as yesterday. Tolerated clear liquids yesterday. Denies n/v. Complaining of new urinary frequency. ECHO pending. WBC about the same 20.4, TMAX 99.6. LFTs WNL  States that she talked with her daughter yesterday and she has decided that she might rather proceed with surgery rather than perc chole tube if cleared.  Objective: Vital signs in last 24 hours: Temp:  [98.1 F (36.7 C)-99.2 F (37.3 C)] 98.1 F (36.7 C) (06/07 0643) Pulse Rate:  [75-92] 84 (06/07 0643) Resp:  [16-19] 16 (06/07 0643) BP: (112-162)/(43-98) 143/46 (06/07 0643) SpO2:  [95 %-100 %] 95 % (06/07 0643)    Intake/Output from previous day: 06/06 0701 - 06/07 0700 In: 1018.2 [P.O.:150; I.V.:772.5; IV Piggyback:95.7] Out: -  Intake/Output this shift: No intake/output data recorded.  PE: Gen:  Alert, NAD, pleasant HEENT: EOM's intact, pupils equal and round Card:  RRR Pulm:  CTAB, no W/R/R, rate and effort normal Abd: Soft, ND, TTP RUQ and epigastric region, +BS Psych: A&Ox4  Skin: no rashes noted, warm and dry  Lab Results:  Recent Labs    09/17/19 0346 09/18/19 0751  WBC 21.8* 20.4*  HGB 11.6* 9.7*  HCT 34.3* 29.0*  PLT 295 230   BMET Recent Labs    09/17/19 1543 09/18/19 0751  NA 127* 128*  K 4.5 3.8  CL 94* 98  CO2 22 21*  GLUCOSE 112* 123*  BUN 20 16  CREATININE 1.13* 1.01*  CALCIUM 8.7* 8.4*   PT/INR No results for input(s): LABPROT, INR in the last 72 hours. CMP     Component Value Date/Time   NA 128 (L) 09/18/2019 0751   K 3.8 09/18/2019 0751   CL 98 09/18/2019 0751   CO2 21 (L) 09/18/2019 0751   GLUCOSE 123 (H) 09/18/2019 0751   BUN 16 09/18/2019 0751   CREATININE 1.01 (H) 09/18/2019 0751   CALCIUM 8.4 (L) 09/18/2019 0751   PROT 5.8 (L) 09/18/2019 0751   ALBUMIN 2.7 (L) 09/18/2019 0751   AST 18 09/18/2019 0751   ALT 16 09/18/2019 0751   ALKPHOS 65  09/18/2019 0751   BILITOT 0.9 09/18/2019 0751   GFRNONAA 48 (L) 09/18/2019 0751   GFRAA 56 (L) 09/18/2019 0751   Lipase     Component Value Date/Time   LIPASE 27 09/17/2019 0346       Studies/Results: CT ABDOMEN PELVIS W CONTRAST  Result Date: 09/17/2019 CLINICAL DATA:  Pt. Is lactose intolerant and had ice cream and a little wine. Per EMS, pt. stomach started hurting and she started feeling sick. EXAM: CT ABDOMEN AND PELVIS WITH CONTRAST TECHNIQUE: Multidetector CT imaging of the abdomen and pelvis was performed using the standard protocol following bolus administration of intravenous contrast. CONTRAST:  150mL OMNIPAQUE IOHEXOL 300 MG/ML  SOLN COMPARISON:  None. FINDINGS: Lower chest: Linear and reticular lower lobe opacities are noted consistent with chronic scarring/atelectasis. No acute lung base abnormalities. Hepatobiliary: 1 cm focus of enhancement in segment 7, nonspecific. No other liver mass or lesion. Liver normal in size and overall attenuation. Gallbladder is distended with wall thickening and adjacent inflammation. There is a 1 cm stone in the cystic duct. Common bile duct is normal in caliber for age. Pancreas: Unremarkable. No pancreatic ductal dilatation or surrounding inflammatory changes. Spleen: Normal in size without focal abnormality. Adrenals/Urinary Tract: No adrenal masses. Kidneys normal in size, orientation and position with symmetric enhancement and excretion.  No renal masses, stones or hydronephrosis. Normal ureters. Normal bladder. Stomach/Bowel: Normal stomach. Small bowel and colon are normal in caliber. No wall thickening. No inflammation. Normal appendix visualized. Vascular/Lymphatic: Extensive aortic and iliac artery atherosclerosis. No aneurysm. No enlarged lymph nodes. Reproductive: Uterus and bilateral adnexa are unremarkable. Other: Trace amount of ascites, with fluid attenuation adjacent to the gallbladder and collecting in the posterior pelvic recess.  Musculoskeletal: No fracture or acute finding. No osteoblastic or osteolytic lesions. IMPRESSION: 1. Acute cholecystitis. Gallbladder is distended with wall thickening and adjacent inflammation. There is a 1 cm stone within the cystic duct. Trace amount of associated ascites. 2. No other acute abnormality within the abdomen or pelvis. 3. Extensive aortoiliac atherosclerosis. Electronically Signed   By: Amie Portland M.D.   On: 09/17/2019 06:29    Anti-infectives: Anti-infectives (From admission, onward)   Start     Dose/Rate Route Frequency Ordered Stop   09/18/19 0700  cefTRIAXone (ROCEPHIN) 2 g in sodium chloride 0.9 % 100 mL IVPB     2 g 200 mL/hr over 30 Minutes Intravenous Every 24 hours 09/17/19 1827     09/17/19 0645  cefTRIAXone (ROCEPHIN) 2 g in sodium chloride 0.9 % 100 mL IVPB     2 g 200 mL/hr over 30 Minutes Intravenous  Once 09/17/19 0644 09/17/19 0757       Assessment/Plan HTN HLD Asthma S/p pacemaker Hyponatremia  Acute cholecystitis  - ECHO pending. Will make plan once results are back, percutaneous cholecystostomy tube vs lap chole. Patient thinks she would like to proceed with surgery if cleared. Keep NPO for now and continue IV rocephin.  Check u/a, Ucx.  ID - rocephin 6/6>> VTE - SCDs FEN - IVF, NPO Foley - none Follow up - TBD   LOS: 1 day    Franne Forts, [facility] 09/18/2019, 8:54 AM Please see Amion for pager number during day hours 7:00am-4:30pm

## 2019-09-19 ENCOUNTER — Inpatient Hospital Stay (HOSPITAL_COMMUNITY): Payer: Medicare Other | Admitting: Anesthesiology

## 2019-09-19 ENCOUNTER — Encounter (HOSPITAL_COMMUNITY)
Admission: EM | Disposition: A | Discharge status: Home Health | Payer: Self-pay | Source: Home / Self Care | Attending: Family Medicine

## 2019-09-19 ENCOUNTER — Encounter (HOSPITAL_COMMUNITY): Payer: Self-pay | Admitting: Internal Medicine

## 2019-09-19 HISTORY — PX: CHOLECYSTECTOMY: SHX55

## 2019-09-19 LAB — BASIC METABOLIC PANEL
Anion gap: 8 (ref 5–15)
BUN: 9 mg/dL (ref 8–23)
CO2: 21 mmol/L — ABNORMAL LOW (ref 22–32)
Calcium: 8.1 mg/dL — ABNORMAL LOW (ref 8.9–10.3)
Chloride: 101 mmol/L (ref 98–111)
Creatinine, Ser: 0.86 mg/dL (ref 0.44–1.00)
GFR calc Af Amer: >60 mL/min (ref >60)
GFR calc non Af Amer: 58 mL/min — ABNORMAL LOW (ref >60)
Glucose, Bld: 118 mg/dL — ABNORMAL HIGH (ref 70–99)
Potassium: 3.3 mmol/L — ABNORMAL LOW (ref 3.5–5.1)
Sodium: 130 mmol/L — ABNORMAL LOW (ref 135–145)

## 2019-09-19 LAB — CBC
HCT: 28.3 % — ABNORMAL LOW (ref 36.0–46.0)
Hemoglobin: 9.4 g/dL — ABNORMAL LOW (ref 12.0–15.0)
MCH: 30.7 pg (ref 26.0–34.0)
MCHC: 33.2 g/dL (ref 30.0–36.0)
MCV: 92.5 fL (ref 80.0–100.0)
Platelets: 229 10*3/uL (ref 150–400)
RBC: 3.06 MIL/uL — ABNORMAL LOW (ref 3.87–5.11)
RDW: 13 % (ref 11.5–15.5)
WBC: 14.7 10*3/uL — ABNORMAL HIGH (ref 4.0–10.5)
nRBC: 0 % (ref 0.0–0.2)

## 2019-09-19 LAB — URINE CULTURE: Culture: NO GROWTH

## 2019-09-19 LAB — SURGICAL PCR SCREEN
MRSA, PCR: NEGATIVE
Staphylococcus aureus: NEGATIVE

## 2019-09-19 SURGERY — LAPAROSCOPIC CHOLECYSTECTOMY WITH INTRAOPERATIVE CHOLANGIOGRAM
Anesthesia: General | Site: Abdomen

## 2019-09-19 MED ORDER — SODIUM CHLORIDE 0.9 % IV SOLN
510.0000 mg | Freq: Once | INTRAVENOUS | Status: AC
  Administered 2019-09-19: 510 mg via INTRAVENOUS
  Filled 2019-09-19: qty 17

## 2019-09-19 MED ORDER — LIDOCAINE 2% (20 MG/ML) 5 ML SYRINGE
INTRAMUSCULAR | Status: AC
  Filled 2019-09-19: qty 5

## 2019-09-19 MED ORDER — TRAMADOL HCL 50 MG PO TABS
50.0000 mg | ORAL_TABLET | Freq: Four times a day (QID) | ORAL | Status: DC | PRN
  Administered 2019-09-21: 50 mg via ORAL
  Filled 2019-09-19: qty 1

## 2019-09-19 MED ORDER — FENTANYL CITRATE (PF) 100 MCG/2ML IJ SOLN
25.0000 ug | INTRAMUSCULAR | Status: DC | PRN
  Administered 2019-09-19 (×2): 25 ug via INTRAVENOUS

## 2019-09-19 MED ORDER — AMISULPRIDE (ANTIEMETIC) 5 MG/2ML IV SOLN
INTRAVENOUS | Status: AC
  Filled 2019-09-19: qty 2

## 2019-09-19 MED ORDER — ONDANSETRON HCL 4 MG/2ML IJ SOLN: 4.0000 mg | Freq: Once | INTRAMUSCULAR | Status: DC | PRN

## 2019-09-19 MED ORDER — LACTATED RINGERS IV SOLN: INTRAVENOUS | Status: DC

## 2019-09-19 MED ORDER — ONDANSETRON HCL 4 MG/2ML IJ SOLN
INTRAMUSCULAR | Status: DC | PRN
Start: 2019-09-19 — End: 2019-09-19
  Administered 2019-09-19: 4 mg via INTRAVENOUS

## 2019-09-19 MED ORDER — FENTANYL CITRATE (PF) 250 MCG/5ML IJ SOLN
INTRAMUSCULAR | Status: AC
  Filled 2019-09-19: qty 5

## 2019-09-19 MED ORDER — CHLORHEXIDINE GLUCONATE 0.12 % MT SOLN
OROMUCOSAL | Status: AC
  Administered 2019-09-19: 15 mL via OROMUCOSAL
  Filled 2019-09-19: qty 15

## 2019-09-19 MED ORDER — PROPOFOL 10 MG/ML IV BOLUS
INTRAVENOUS | Status: AC
  Filled 2019-09-19: qty 20

## 2019-09-19 MED ORDER — HEMOSTATIC AGENTS (NO CHARGE) OPTIME
TOPICAL | Status: DC | PRN
  Administered 2019-09-19: 1 via TOPICAL

## 2019-09-19 MED ORDER — SODIUM CHLORIDE 0.9 % IR SOLN
Status: DC | PRN
  Administered 2019-09-19: 1000 mL

## 2019-09-19 MED ORDER — BUPIVACAINE HCL (PF) 0.25 % IJ SOLN
INTRAMUSCULAR | Status: AC
  Filled 2019-09-19: qty 30

## 2019-09-19 MED ORDER — CHLORHEXIDINE GLUCONATE 0.12 % MT SOLN: 15.0000 mL | Freq: Once | OROMUCOSAL | Status: AC

## 2019-09-19 MED ORDER — ENOXAPARIN SODIUM 30 MG/0.3ML ~~LOC~~ SOLN: 30.0000 mg | SUBCUTANEOUS | Status: DC

## 2019-09-19 MED ORDER — POTASSIUM CHLORIDE CRYS ER 20 MEQ PO TBCR: 40.0000 meq | EXTENDED_RELEASE_TABLET | Freq: Once | ORAL | Status: DC

## 2019-09-19 MED ORDER — FENTANYL CITRATE (PF) 250 MCG/5ML IJ SOLN
INTRAMUSCULAR | Status: DC | PRN
  Administered 2019-09-19 (×2): 50 ug via INTRAVENOUS

## 2019-09-19 MED ORDER — AMISULPRIDE (ANTIEMETIC) 5 MG/2ML IV SOLN
5.0000 mg | Freq: Once | INTRAVENOUS | Status: AC
  Administered 2019-09-19: 5 mg via INTRAVENOUS

## 2019-09-19 MED ORDER — SUGAMMADEX SODIUM 200 MG/2ML IV SOLN
INTRAVENOUS | Status: DC | PRN
  Administered 2019-09-19: 200 mg via INTRAVENOUS

## 2019-09-19 MED ORDER — 0.9 % SODIUM CHLORIDE (POUR BTL) OPTIME
TOPICAL | Status: DC | PRN
  Administered 2019-09-19: 1000 mL

## 2019-09-19 MED ORDER — ONDANSETRON HCL 4 MG/2ML IJ SOLN
INTRAMUSCULAR | Status: AC
  Filled 2019-09-19: qty 2

## 2019-09-19 MED ORDER — ORAL CARE MOUTH RINSE: 15.0000 mL | Freq: Once | OROMUCOSAL | Status: AC

## 2019-09-19 MED ORDER — FENTANYL CITRATE (PF) 100 MCG/2ML IJ SOLN
INTRAMUSCULAR | Status: AC
  Filled 2019-09-19: qty 2

## 2019-09-19 MED ORDER — INDOCYANINE GREEN 25 MG IV SOLR
INTRAVENOUS | Status: DC | PRN
Start: 2019-09-19 — End: 2019-09-19
  Administered 2019-09-19: 2.5 mg via INTRAVENOUS

## 2019-09-19 MED ORDER — DEXAMETHASONE SODIUM PHOSPHATE 10 MG/ML IJ SOLN
INTRAMUSCULAR | Status: AC
  Filled 2019-09-19: qty 1

## 2019-09-19 MED ORDER — LACTATED RINGERS IV SOLN: INTRAVENOUS | Status: DC | PRN

## 2019-09-19 MED ORDER — BUPIVACAINE HCL 0.25 % IJ SOLN
INTRAMUSCULAR | Status: DC | PRN
  Administered 2019-09-19: 7 mL
  Administered 2019-09-19: 30 mL

## 2019-09-19 MED ORDER — LIDOCAINE 2% (20 MG/ML) 5 ML SYRINGE
INTRAMUSCULAR | Status: DC | PRN
  Administered 2019-09-19: 30 mg via INTRAVENOUS

## 2019-09-19 MED ORDER — SUGAMMADEX SODIUM 200 MG/2ML IV SOLN: INTRAVENOUS | Status: DC | PRN

## 2019-09-19 MED ORDER — PROPOFOL 10 MG/ML IV BOLUS
INTRAVENOUS | Status: DC | PRN
  Administered 2019-09-19: 110 mg via INTRAVENOUS

## 2019-09-19 MED ORDER — ROCURONIUM BROMIDE 10 MG/ML (PF) SYRINGE
PREFILLED_SYRINGE | INTRAVENOUS | Status: DC | PRN
  Administered 2019-09-19: 50 mg via INTRAVENOUS

## 2019-09-19 MED ORDER — POTASSIUM CHLORIDE CRYS ER 20 MEQ PO TBCR
40.0000 meq | EXTENDED_RELEASE_TABLET | Freq: Once | ORAL | Status: AC
  Administered 2019-09-19: 40 meq via ORAL
  Filled 2019-09-19: qty 2

## 2019-09-19 SURGICAL SUPPLY — 49 items
APPLIER CLIP 5 13 M/L LIGAMAX5 (MISCELLANEOUS)
CANISTER SUCT 3000ML PPV (MISCELLANEOUS) ×4 IMPLANT
CHLORAPREP W/TINT 26 (MISCELLANEOUS) ×4 IMPLANT
CLIP APPLIE 5 13 M/L LIGAMAX5 (MISCELLANEOUS) IMPLANT
CLIP VESOLOCK MED LG 6/CT (CLIP) ×4 IMPLANT
COVER MAYO STAND STRL (DRAPES) ×4 IMPLANT
COVER SURGICAL LIGHT HANDLE (MISCELLANEOUS) ×4 IMPLANT
COVER TRANSDUCER ULTRASND (DRAPES) ×4 IMPLANT
COVER WAND RF STERILE (DRAPES) ×4 IMPLANT
DEFOGGER SCOPE WARMER CLEARIFY (MISCELLANEOUS) IMPLANT
DERMABOND ADVANCED (GAUZE/BANDAGES/DRESSINGS) ×2
DERMABOND ADVANCED .7 DNX12 (GAUZE/BANDAGES/DRESSINGS) ×2 IMPLANT
DRAPE C-ARM 42X120 X-RAY (DRAPES) ×4 IMPLANT
ELECT REM PT RETURN 9FT ADLT (ELECTROSURGICAL) ×4
ELECTRODE REM PT RTRN 9FT ADLT (ELECTROSURGICAL) ×2 IMPLANT
ENDOLOOP SUT PDS II  0 18 (SUTURE) ×6
ENDOLOOP SUT PDS II 0 18 (SUTURE) ×6 IMPLANT
GLOVE BIO SURGEON STRL SZ7.5 (GLOVE) ×4 IMPLANT
GOWN STRL REUS W/ TWL LRG LVL3 (GOWN DISPOSABLE) ×4 IMPLANT
GOWN STRL REUS W/ TWL XL LVL3 (GOWN DISPOSABLE) ×2 IMPLANT
GOWN STRL REUS W/TWL LRG LVL3 (GOWN DISPOSABLE) ×4
GOWN STRL REUS W/TWL XL LVL3 (GOWN DISPOSABLE) ×2
GRASPER SUT TROCAR 14GX15 (MISCELLANEOUS) ×4 IMPLANT
HEMOSTAT SNOW SURGICEL 2X4 (HEMOSTASIS) ×4 IMPLANT
IV CATH 14GX2 1/4 (CATHETERS) ×4 IMPLANT
KIT BASIN OR (CUSTOM PROCEDURE TRAY) ×4 IMPLANT
KIT IMAGING PINPOINTPAQ (MISCELLANEOUS) ×4 IMPLANT
KIT TURNOVER KIT B (KITS) ×4 IMPLANT
NEEDLE INSUFFLATION 14GA 120MM (NEEDLE) ×4 IMPLANT
NS IRRIG 1000ML POUR BTL (IV SOLUTION) ×4 IMPLANT
PAD ARMBOARD 7.5X6 YLW CONV (MISCELLANEOUS) ×8 IMPLANT
POUCH LAPAROSCOPIC INSTRUMENT (MISCELLANEOUS) ×4 IMPLANT
POUCH RETRIEVAL ECOSAC 10 (ENDOMECHANICALS) ×2 IMPLANT
POUCH RETRIEVAL ECOSAC 10MM (ENDOMECHANICALS) ×2
SCISSORS LAP 5X35 DISP (ENDOMECHANICALS) ×4 IMPLANT
SET CHOLANGIOGRAPHY FRANKLIN (SET/KITS/TRAYS/PACK) ×4 IMPLANT
SET IRRIG TUBING LAPAROSCOPIC (IRRIGATION / IRRIGATOR) ×4 IMPLANT
SET TUBE SMOKE EVAC HIGH FLOW (TUBING) ×4 IMPLANT
SLEEVE ENDOPATH XCEL 5M (ENDOMECHANICALS) ×4 IMPLANT
SPECIMEN JAR SMALL (MISCELLANEOUS) ×4 IMPLANT
STOPCOCK 4 WAY LG BORE MALE ST (IV SETS) ×8 IMPLANT
SUT MNCRL AB 4-0 PS2 18 (SUTURE) ×4 IMPLANT
SUT VICRYL 0 UR6 27IN ABS (SUTURE) ×4 IMPLANT
TOWEL GREEN STERILE (TOWEL DISPOSABLE) ×4 IMPLANT
TOWEL GREEN STERILE FF (TOWEL DISPOSABLE) ×4 IMPLANT
TRAY LAPAROSCOPIC MC (CUSTOM PROCEDURE TRAY) ×4 IMPLANT
TROCAR XCEL NON-BLD 11X100MML (ENDOMECHANICALS) ×4 IMPLANT
TROCAR XCEL NON-BLD 5MMX100MML (ENDOMECHANICALS) ×4 IMPLANT
WATER STERILE IRR 1000ML POUR (IV SOLUTION) ×4 IMPLANT

## 2019-09-19 NOTE — Progress Notes (Signed)
Patient arrived back to room, VS stable. Daughter aware that patient is out of surgery.

## 2019-09-19 NOTE — Op Note (Signed)
09/19/2019  4:32 PM  PATIENT:  Sarah Burns  86 y.o. female  PRE-OPERATIVE DIAGNOSIS:  ACUTE CHOLECYSTITIS  POST-OPERATIVE DIAGNOSIS:  ACUTE CHOLECYSTITIS  PROCEDURE:  Procedure(s): LAPAROSCOPIC CHOLECYSTECTOMY (N/A) Indocyanine Green Fluorescence Imaging (Icg) (N/A)  SURGEON:  Surgeon(s) and Role:    * Axel Filler, MD - Primary    Violeta Gelinas, MD - Assisting that she was crucial with identification of the anatomy, retraction and ligation of the cystic duct.  ANESTHESIA:   local and general  EBL:  84mL   BLOOD ADMINISTERED:none  DRAINS: none   LOCAL MEDICATIONS USED:  BUPIVICAINE   SPECIMEN:  Source of Specimen:  gallbladder  DISPOSITION OF SPECIMEN:  PATHOLOGY  COUNTS:  YES  TOURNIQUET:  * No tourniquets in log *  DICTATION: .Dragon Dictation The patient was taken to the operating and placed in the supine position with bilateral SCDs in place.  The patient was prepped and draped in the usual sterile fashion. A time out was called and all facts were verified. A pneumoperitoneum was obtained via A Veress needle technique to a pressure of 6mm of mercury.  A 79mm trochar was then placed in the right upper quadrant under visualization, and there were no injuries to any abdominal organs. A 11 mm port was then placed in the umbilical region after infiltrating with local anesthesia under direct visualization. A second and third epigastric port and right lower quadrant port placement under direct visualization, respectively.    The gallbladder was seen and was very edematous.  This was suctioned out with a Nezhat suction.  The gallbladder was identified and retracted, the peritoneum was then sharply dissected from the gallbladder and this dissection was carried down to Calot's triangle.  There is large amount of inflammation to the gallbladder.  The gallbladder was identified and stripped away circumferentially and seen going into the gallbladder 360, the critical angle was  obtained.  This was done via a dome down technique.  Immunofluorescence was used to help identify the common duct.  At this time we decided to stay high on the neck of the gallbladder.  2 Endoloops were placed proximally 1 distally and the neck of the gallbladder was transected in between these Endoloops.  The cystic artery was not identified. A retrieval bag was then placed in the abdomen and gallbladder placed in the bag. The hepatic fossa was then reexamined and hemostasis was achieved with Bovie cautery and was excellent at the end of the case.   The subhepatic fossa and perihepatic fossa was then irrigated until the effluent was clear.  The gallbladder and bag were removed from the abdominal cavity. The 11 mm trocar fascia was reapproximated with the Endo Close #1 Vicryl x3.  The pneumoperitoneum was evacuated and all trochars removed under direct visulalization.  The skin was then closed with 4-0 Monocryl and the skin dressed with Dermabond.    The patient was awaken from general anesthesia and taken to the recovery room in stable condition.  PLAN OF CARE: Admit to inpatient   PATIENT DISPOSITION:  PACU - hemodynamically stable.   Delay start of Pharmacological VTE agent (>24hrs) due to surgical blood loss or risk of bleeding: yes

## 2019-09-19 NOTE — Progress Notes (Signed)
Progress Note    Sarah MouldsLoretta Desmith  ZOX:[id number] DOB: 1925/02/09  DOA: 09/17/2019 PCP: Renaye RakersBland, Veita, MD    Brief Narrative:     Medical records reviewed and are as summarized below:  Sarah Burns is an 86 y.o. female with a past medical history that includes hypertension, hyperlipidemia, asthma, pacemaker admitted 6/6 from home with a chief complaint abdominal pain/nausea and vomiting.  Work-up in the emergency department included a CT scan that noted acute cholecystitis.  Patient also has a 1 cm stone in the cystic duct.  Lab work reveals hyponatremia leukocytosis.  General surgery consulted and plan is for OR.    Assessment/Plan:   Principal Problem:   Acute cholecystitis Active Problems:   Pressure injury of skin   Hyponatremia   Hypertension   Asthma   Allergies   Hyperlipidemia   Anemia   Acute cholecystitis.   -echo unimpressive -plan for OR Today?-- defer to GS   Hyponatremia.   -trending up   Leukocytosis.  -trending down -Continue Rocephin   Hypertension.  Fair control.  Home medications include Norvasc and losartan.  Asthma.  Medications include inhalers.  Stable at baseline  Anemia.  Hg. dropped from 11-9.   -IV Fe  -monitor  hyperlipidemia. -Statin  obesity Body mass index is 32.44 kg/m.   Family Communication/Anticipated D/C date and plan/Code Status   DVT prophylaxis: Lovenox ordered. Code Status: Full Code.  Disposition Plan: Status is: Inpatient  Remains inpatient appropriate because:Inpatient level of care appropriate due to severity of illness   Dispo: The patient is from: Home              Anticipated d/c is to: Home              Anticipated d/c date is: 2 days              Patient currently is not medically stable to d/c.    Medical Consultants:    GS    Subjective:   Ready to go home and eat-- but never again [employer]  Objective:    Vitals:   09/18/19 2058 09/18/19 2326 09/19/19 0527 09/19/19 0825  BP:   (!) 139/47 (!) 144/45   Pulse:  87 78 82  Resp:  16 18 20   Temp:  98.1 F (36.7 C) 98.6 F (37 C)   TempSrc:  Oral Oral   SpO2: 99%  96% 93%  Weight:      Height:        Intake/Output Summary (Last 24 hours) at 09/19/2019 1132 Last data filed at 09/19/2019 [id number] Gross per 24 hour  Intake 425.05 ml  Output 700 ml  Net -274.95 ml   Filed Weights   09/17/19 0304  Weight: 54.4 kg    Exam:  General: Appearance:    Obese female in no acute distress, mildly hard of hearing     Lungs:     Clear to auscultation bilaterally, respirations unlabored  Heart:    Normal heart rate. Normal rhythm. No murmurs, rubs, or gallops.   MS:   All extremities are intact.   Neurologic:   Awake, alert. No apparent focal neurological           defect.     Data Reviewed:   I have personally reviewed following labs and imaging studies:  Labs: Labs show the following:   Basic Metabolic Panel: Recent Labs  Lab 09/17/19 0346 09/17/19 0346 09/17/19 1543 09/17/19 1543 09/18/19 0751 09/19/19 0716  NA 126*  --  127*  --  128* 130*  K 4.1   < > 4.5   < > 3.8 3.3*  CL 91*  --  94*  --  98 101  CO2 22  --  22  --  21* 21*  GLUCOSE 161*  --  112*  --  123* 118*  BUN 17  --  20  --  16 9  CREATININE 1.09*  --  1.13*  --  1.01* 0.86  CALCIUM 9.4  --  8.7*  --  8.4* 8.1*   < > = values in this interval not displayed.   GFR Estimated Creatinine Clearance: 23.6 mL/min (by C-G formula based on SCr of 0.86 mg/dL). Liver Function Tests: Recent Labs  Lab 09/17/19 0346 09/18/19 0751  AST 24 18  ALT 19 16  ALKPHOS 64 65  BILITOT 0.8 0.9  PROT 6.8 5.8*  ALBUMIN 3.5 2.7*   Recent Labs  Lab 09/17/19 0346  LIPASE 27   No results for input(s): AMMONIA in the last 168 hours. Coagulation profile No results for input(s): INR, PROTIME in the last 168 hours.  CBC: Recent Labs  Lab 09/17/19 0346 09/18/19 0751 09/19/19 0716  WBC 21.8* 20.4* 14.7*  NEUTROABS 20.0*  --   --   HGB 11.6* 9.7* 9.4*    HCT 34.3* 29.0* 28.3*  MCV 91.0 92.1 92.5  PLT 295 230 229   Cardiac Enzymes: No results for input(s): CKTOTAL, CKMB, CKMBINDEX, TROPONINI in the last 168 hours. BNP (last 3 results) No results for input(s): PROBNP in the last 8760 hours. CBG: No results for input(s): GLUCAP in the last 168 hours. D-Dimer: No results for input(s): DDIMER in the last 72 hours. Hgb A1c: No results for input(s): HGBA1C in the last 72 hours. Lipid Profile: No results for input(s): CHOL, HDL, LDLCALC, TRIG, CHOLHDL, LDLDIRECT in the last 72 hours. Thyroid function studies: No results for input(s): TSH, T4TOTAL, T3FREE, THYROIDAB in the last 72 hours.  Invalid input(s): FREET3 Anemia work up: Recent Labs    09/18/19 0944 09/18/19 1030  VITAMINB12 259  --   FOLATE 8.7  --   FERRITIN 138  --   TIBC 223*  --   IRON 11*  --   RETICCTPCT  --  1.5   Sepsis Labs: Recent Labs  Lab 09/17/19 0346 09/18/19 0751 09/19/19 0716  WBC 21.8* 20.4* 14.7*    Microbiology Recent Results (from the past 240 hour(s))  SARS Coronavirus 2 by RT PCR (hospital order, performed in [facility] hospital lab) Nasopharyngeal Nasopharyngeal Swab     Status: None   Collection Time: 09/17/19  7:28 AM   Specimen: Nasopharyngeal Swab  Result Value Ref Range Status   SARS Coronavirus 2 NEGATIVE NEGATIVE Final    Comment: (NOTE) SARS-CoV-2 target nucleic acids are NOT DETECTED. The SARS-CoV-2 RNA is generally detectable in upper and lower respiratory specimens during the acute phase of infection. The lowest concentration of SARS-CoV-2 viral copies this assay can detect is 250 copies / mL. A negative result does not preclude SARS-CoV-2 infection and should not be used as the sole basis for treatment or other patient management decisions.  A negative result may occur with improper specimen collection / handling, submission of specimen other than nasopharyngeal swab, presence of viral mutation(s) within the areas  targeted by this assay, and inadequate number of viral copies (<250 copies / mL). A negative result must be combined with clinical observations, patient history, and epidemiological information. Fact Sheet for Patients:  StrictlyIdeas.no Fact Sheet for Healthcare Providers: BankingDealers.co.za This test is not yet approved or cleared  by the [place] FDA and has been authorized for detection and/or diagnosis of SARS-CoV-2 by FDA under an Emergency Use Authorization (EUA).  This EUA will remain in effect (meaning this test can be used) for the duration of the COVID-19 declaration under Section 564(b)(1) of the Act, 21 U.S.C. section 360bbb-3(b)(1), unless the authorization is terminated or revoked sooner. Performed at [facility], [place] [address], [place], [place] [address]   Culture, Urine     Status: None   Collection Time: 09/18/19 10:31 AM   Specimen: Urine, Clean Catch  Result Value Ref Range Status   Specimen Description URINE, CLEAN CATCH  Final   Special Requests NONE  Final   Culture   Final    NO GROWTH Performed at [facility], [place] [address], [place], [place] [address]    Report Status 09/19/2019 FINAL  Final  Surgical pcr screen     Status: None   Collection Time: 09/19/19  6:25 AM   Specimen: Nasal Mucosa; Nasal Swab  Result Value Ref Range Status   MRSA, PCR NEGATIVE NEGATIVE Final   Staphylococcus aureus NEGATIVE NEGATIVE Final    Comment: (NOTE) The Xpert SA Assay (FDA approved for NASAL specimens in patients 97 years of age and older), is one component of a comprehensive surveillance program. It is not intended to diagnose infection nor to guide or monitor treatment. Performed at [facility], [place] [address] [place], [place] [address]     Procedures and diagnostic studies:  ECHOCARDIOGRAM COMPLETE  Result Date: 09/18/2019    ECHOCARDIOGRAM REPORT   Patient Name:   DESERI Delauter Date  of Exam: 09/18/2019 Medical Rec #:  [id number]    Height:       51.0 in Accession #:    [id number]   Weight:       120.0 lb Date of Birth:  [birth year]    BSA:          1.335 m Patient Age:    86 years     BP:           143/46 mmHg Patient Gender: F            HR:           71 bpm. Exam Location:  Inpatient Procedure: 2D Echo, Color Doppler and Cardiac Doppler Indications:    Pre-operative cardiovascular exam z01.810  History:        Patient has no prior history of Echocardiogram examinations.                 Pacemaker; Risk Factors:Hypertension and Dyslipidemia.  Sonographer:    Raquel Sarna Senior RDCS Referring Phys: [phone] Sylvie Mifsud [facility]  Sonographer Comments: Technically difficult parasternal window due to small rib spacing. IMPRESSIONS  1. Left ventricular ejection fraction, by estimation, is 65 to 70%. The left ventricle has normal function. The left ventricle has no regional wall motion abnormalities. Left ventricular diastolic parameters are consistent with Grade I diastolic dysfunction (impaired relaxation). Elevated left ventricular end-diastolic pressure.  2. Right ventricular systolic function is normal. The right ventricular size is normal. There is mildly elevated pulmonary artery systolic pressure.  3. Left atrial size was moderately dilated.  4. The mitral valve is abnormal. Mild mitral valve regurgitation.  5. The aortic valve is tricuspid. Aortic valve regurgitation is not visualized.  6. The inferior vena cava is normal in  size with greater than 50% respiratory variability, suggesting right atrial pressure of 3 mmHg. FINDINGS  Left Ventricle: Left ventricular ejection fraction, by estimation, is 65 to 70%. The left ventricle has normal function. The left ventricle has no regional wall motion abnormalities. The left ventricular internal cavity size was normal in size. There is  no left ventricular hypertrophy. Left ventricular diastolic parameters are consistent with Grade I diastolic dysfunction (impaired  relaxation). Elevated left ventricular end-diastolic pressure. Right Ventricle: The right ventricular size is normal. No increase in right ventricular wall thickness. Right ventricular systolic function is normal. There is mildly elevated pulmonary artery systolic pressure. The tricuspid regurgitant velocity is 2.96  m/s, and with an assumed right atrial pressure of 3 mmHg, the estimated right ventricular systolic pressure is 38.0 mmHg. Left Atrium: Left atrial size was moderately dilated. Right Atrium: Right atrial size was normal in size. Pericardium: There is no evidence of pericardial effusion. Mitral Valve: The mitral valve is abnormal. There is mild thickening of the mitral valve leaflet(s). Mild mitral valve regurgitation. Tricuspid Valve: The tricuspid valve is grossly normal. Tricuspid valve regurgitation is trivial. Aortic Valve: The aortic valve is tricuspid. Aortic valve regurgitation is not visualized. Pulmonic Valve: The pulmonic valve was grossly normal. Pulmonic valve regurgitation is not visualized. Aorta: The aortic root and ascending aorta are structurally normal, with no evidence of dilitation. Venous: The inferior vena cava is normal in size with greater than 50% respiratory variability, suggesting right atrial pressure of 3 mmHg. IAS/Shunts: No atrial level shunt detected by color flow Doppler. Additional Comments: A pacer wire is visualized.  LEFT VENTRICLE PLAX 2D LVIDd:         4.10 cm  Diastology LVIDs:         2.70 cm  LV e' lateral:   5.00 cm/s LV PW:         1.00 cm  LV E/e' lateral: 20.2 LV IVS:        0.90 cm  LV e' medial:    5.77 cm/s LVOT diam:     1.80 cm  LV E/e' medial:  17.5 LV SV:         69 LV SV Index:   51 LVOT Area:     2.54 cm  RIGHT VENTRICLE RV S prime:     12.70 cm/s TAPSE (M-mode): 2.5 cm LEFT ATRIUM             Index       RIGHT ATRIUM           Index LA diam:        3.70 cm 2.77 cm/m  RA Area:     11.40 cm LA Vol (A2C):   42.8 ml 32.06 ml/m RA Volume:   23.60 ml   17.68 ml/m LA Vol (A4C):   67.1 ml 50.26 ml/m LA Biplane Vol: 56.9 ml 42.62 ml/m  AORTIC VALVE LVOT Vmax:   105.00 cm/s LVOT Vmean:  75.200 cm/s LVOT VTI:    0.270 m  AORTA Ao Root diam: 2.60 cm Ao Asc diam:  2.20 cm MITRAL VALVE                TRICUSPID VALVE MV Area (PHT): 2.69 cm     TR Peak grad:   35.0 mmHg MV Decel Time: 282 msec     TR Vmax:        296.00 cm/s MV E velocity: 101.00 cm/s MV A velocity: 142.00 cm/s  SHUNTS MV E/A ratio:  0.71  Systemic VTI:  0.27 m                             Systemic Diam: 1.80 cm Zoila Shutter MD Electronically signed by Zoila Shutter MD Signature Date/Time: 09/18/2019/1:07:49 PM    Final     Medications:    amLODipine  10 mg Oral Daily   enoxaparin (LOVENOX) injection  30 mg Subcutaneous Q24H   mometasone-formoterol  2 puff Inhalation BID   Continuous Infusions:  sodium chloride 75 mL/hr at 09/19/19 0332   cefTRIAXone (ROCEPHIN)  IV 2 g (09/19/19 0612)     LOS: 2 days   Joseph Art  [facility]   How to contact the [facility] Attending or Consulting provider 7A - 7P or covering provider during after hours 7P -7A, for this patient?  1. Check the care team in [facility] and look for a) attending/consulting [facility] provider listed and b) the [facility] team listed 2. Log into [email or web address] and use [place]'s universal password to access. If you do not have the password, please contact the hospital operator. 3. Locate the [facility] provider you are looking for under [facility] and page to a number that you can be directly reached. 4. If you still have difficulty reaching the provider, please page the [facility] (Director on Call) for the Hospitalists listed on amion for assistance.  09/19/2019, 11:32 AM

## 2019-09-19 NOTE — Transfer of Care (Signed)
Immediate Anesthesia Transfer of Care Note  Patient: Rachel Moulds  Procedure(s) Performed: LAPAROSCOPIC CHOLECYSTECTOMY (N/A ) Indocyanine Green Fluorescence Imaging (Icg) (N/A Abdomen)  Patient Location: PACU  Anesthesia Type:General  Level of Consciousness: awake, alert  and oriented  Airway & Oxygen Therapy: Patient Spontanous Breathing and Patient connected to face mask oxygen  Post-op Assessment: Report given to RN and Post -op Vital signs reviewed and stable  Post vital signs: Reviewed and stable  Last Vitals:  Vitals Value Taken Time  BP 159/42 09/19/19 1635  Temp    Pulse 83 09/19/19 1639  Resp 15 09/19/19 1639  SpO2 95 % 09/19/19 1639  Vitals shown include unvalidated device data.  Last Pain:  Vitals:   09/19/19 1328  TempSrc:   PainSc: 0-No pain      Patients Stated Pain Goal: 4 (09/19/19 1328)  Complications: No apparent anesthesia complications

## 2019-09-19 NOTE — Progress Notes (Signed)
Report called to Gaynelle Adu, RN in short stay.

## 2019-09-19 NOTE — Anesthesia Procedure Notes (Addendum)
Procedure Name: Intubation Date/Time: 09/19/2019 2:59 PM Performed by: Bryson Corona, CRNA Pre-anesthesia Checklist: Patient identified, Emergency Drugs available, Suction available and Patient being monitored Patient Re-evaluated:Patient Re-evaluated prior to induction Oxygen Delivery Method: Circle System Utilized Preoxygenation: Pre-oxygenation with 100% oxygen Induction Type: IV induction Ventilation: Mask ventilation without difficulty Laryngoscope Size: Mac and 3 Grade View: Grade I Tube type: Oral Number of attempts: 1 Airway Equipment and Method: Stylet Placement Confirmation: ETT inserted through vocal cords under direct vision,  positive ETCO2 and breath sounds checked- equal and bilateral Secured at: 21 cm Tube secured with: Tape Dental Injury: Teeth and Oropharynx as per pre-operative assessment

## 2019-09-19 NOTE — Anesthesia Preprocedure Evaluation (Signed)
Anesthesia Evaluation  Patient identified by MRN, date of birth, ID band Patient awake    Reviewed: Allergy & Precautions, NPO status , Patient's Chart, lab work & pertinent test results  Airway Mallampati: II  TM Distance: >3 FB Neck ROM: Full    Dental  (+) Edentulous Upper, Edentulous Lower   Pulmonary former smoker,     + decreased breath sounds      Cardiovascular hypertension,  Rhythm:Regular Rate:Normal     Neuro/Psych    GI/Hepatic   Endo/Other    Renal/GU      Musculoskeletal   Abdominal   Peds  Hematology   Anesthesia Other Findings   Reproductive/Obstetrics                             Anesthesia Physical Anesthesia Plan  ASA: III  Anesthesia Plan: General   Post-op Pain Management:    Induction: Intravenous  PONV Risk Score and Plan: Ondansetron and Dexamethasone  Airway Management Planned: Oral ETT  Additional Equipment:   Intra-op Plan:   Post-operative Plan: Extubation in OR  Informed Consent: I have reviewed the patients History and Physical, chart, labs and discussed the procedure including the risks, benefits and alternatives for the proposed anesthesia with the patient or authorized representative who has indicated his/her understanding and acceptance.       Plan Discussed with: CRNA and Anesthesiologist  Anesthesia Plan Comments:         Anesthesia Quick Evaluation  

## 2019-09-19 NOTE — Anesthesia Postprocedure Evaluation (Signed)
Anesthesia Post Note  Patient: Rachel Moulds  Procedure(s) Performed: LAPAROSCOPIC CHOLECYSTECTOMY (N/A ) Indocyanine Green Fluorescence Imaging (Icg) (N/A Abdomen)     Patient location during evaluation: PACU Anesthesia Type: General Level of consciousness: awake and alert Pain management: pain level controlled Vital Signs Assessment: post-procedure vital signs reviewed and stable Respiratory status: spontaneous breathing, nonlabored ventilation and respiratory function stable Cardiovascular status: blood pressure returned to baseline and stable Postop Assessment: no apparent nausea or vomiting Anesthetic complications: no    Last Vitals:  Vitals:   09/19/19 1720 09/19/19 1734  BP: (!) 161/46 (!) 159/47  Pulse: 81   Resp: 19 18  Temp: (!) 36.2 C (!) 36.3 C  SpO2: 94% 95%    Last Pain:  Vitals:   09/19/19 1734  TempSrc: Axillary  PainSc: 0-No pain                 Payten Beaumier,W. EDMOND

## 2019-09-19 NOTE — Progress Notes (Signed)
Patient off floor to OR

## 2019-09-20 LAB — BASIC METABOLIC PANEL
Anion gap: 14 (ref 5–15)
BUN: 9 mg/dL (ref 8–23)
CO2: 20 mmol/L — ABNORMAL LOW (ref 22–32)
Calcium: 8.8 mg/dL — ABNORMAL LOW (ref 8.9–10.3)
Chloride: 101 mmol/L (ref 98–111)
Creatinine, Ser: 0.81 mg/dL (ref 0.44–1.00)
GFR calc Af Amer: >60 mL/min (ref >60)
GFR calc non Af Amer: >60 mL/min (ref >60)
Glucose, Bld: 168 mg/dL — ABNORMAL HIGH (ref 70–99)
Potassium: 4.5 mmol/L (ref 3.5–5.1)
Sodium: 135 mmol/L (ref 135–145)

## 2019-09-20 LAB — CBC
HCT: 32.9 % — ABNORMAL LOW (ref 36.0–46.0)
Hemoglobin: 11.1 g/dL — ABNORMAL LOW (ref 12.0–15.0)
MCH: 30.8 pg (ref 26.0–34.0)
MCHC: 33.7 g/dL (ref 30.0–36.0)
MCV: 91.4 fL (ref 80.0–100.0)
Platelets: 310 10*3/uL (ref 150–400)
RBC: 3.6 MIL/uL — ABNORMAL LOW (ref 3.87–5.11)
RDW: 12.8 % (ref 11.5–15.5)
WBC: 16.7 10*3/uL — ABNORMAL HIGH (ref 4.0–10.5)
nRBC: 0 % (ref 0.0–0.2)

## 2019-09-20 MED ORDER — ATORVASTATIN CALCIUM 80 MG PO TABS
80.0000 mg | ORAL_TABLET | Freq: Every day | ORAL | Status: DC
  Administered 2019-09-20 – 2019-09-23 (×4): 80 mg via ORAL
  Filled 2019-09-20 (×4): qty 1

## 2019-09-20 MED ORDER — PROCHLORPERAZINE EDISYLATE 10 MG/2ML IJ SOLN
10.0000 mg | Freq: Four times a day (QID) | INTRAMUSCULAR | Status: DC | PRN
  Administered 2019-09-22: 10 mg via INTRAVENOUS
  Filled 2019-09-20: qty 2

## 2019-09-20 MED ORDER — DOCUSATE SODIUM 100 MG PO CAPS
100.0000 mg | ORAL_CAPSULE | Freq: Two times a day (BID) | ORAL | Status: DC
  Administered 2019-09-20 – 2019-09-23 (×7): 100 mg via ORAL
  Filled 2019-09-20 (×7): qty 1

## 2019-09-20 MED ORDER — POLYETHYLENE GLYCOL 3350 17 G PO PACK: 17.0000 g | PACK | Freq: Every day | ORAL | Status: DC | PRN

## 2019-09-20 MED ORDER — BOOST / RESOURCE BREEZE PO LIQD CUSTOM
1.0000 | Freq: Two times a day (BID) | ORAL | Status: DC
  Administered 2019-09-20 – 2019-09-23 (×5): 1 via ORAL

## 2019-09-20 MED ORDER — ENOXAPARIN SODIUM 30 MG/0.3ML ~~LOC~~ SOLN
30.0000 mg | SUBCUTANEOUS | Status: DC
  Administered 2019-09-20: 30 mg via SUBCUTANEOUS
  Filled 2019-09-20: qty 0.3

## 2019-09-20 MED ORDER — HYDRALAZINE HCL 20 MG/ML IJ SOLN: 10.0000 mg | Freq: Four times a day (QID) | INTRAMUSCULAR | Status: DC | PRN

## 2019-09-20 MED ORDER — LOSARTAN POTASSIUM 50 MG PO TABS
50.0000 mg | ORAL_TABLET | Freq: Every day | ORAL | Status: DC
  Administered 2019-09-20 – 2019-09-23 (×4): 50 mg via ORAL
  Filled 2019-09-20 (×4): qty 1

## 2019-09-20 NOTE — Progress Notes (Signed)
[facility] Progress Note  1 Day Post-Op  Subjective: CC-  Sore and nauseated this morning. No emesis. She has only taken a few sips of gingerale. No flatus or BM.  Objective: Vital signs in last 24 hours: Temp:  [97.2 F (36.2 C)-98.3 F (36.8 C)] 98.3 F (36.8 C) (06/09 0531) Pulse Rate:  [80-99] 99 (06/09 0531) Resp:  [11-20] 17 (06/09 0531) BP: (148-183)/(42-70) 183/57 (06/09 0531) SpO2:  [93 %-96 %] 96 % (06/09 0531) Weight:  [54.4 kg] 54.4 kg (06/08 1330)    Intake/Output from previous day: 06/08 0701 - 06/09 0700 In: 2324 [P.O.:600; I.V.:1724] Out: 1525 [Urine:1500; Blood:25] Intake/Output this shift: No intake/output data recorded.  PE: Gen:  Alert, NAD, pleasant Pulm:  rate and effort normal Abd: Soft, mild distension, minimal tenderness, hypoactive BS, lap incisions C/D/I Skin: warm and dry  Lab Results:  Recent Labs    09/19/19 0716 09/20/19 0550  WBC 14.7* 16.7*  HGB 9.4* 11.1*  HCT 28.3* 32.9*  PLT 229 310   BMET Recent Labs    09/19/19 0716 09/20/19 0550  NA 130* 135  K 3.3* 4.5  CL 101 101  CO2 21* 20*  GLUCOSE 118* 168*  BUN 9 9  CREATININE 0.86 0.81  CALCIUM 8.1* 8.8*   PT/INR No results for input(s): LABPROT, INR in the last 72 hours. CMP     Component Value Date/Time   NA 135 09/20/2019 0550   K 4.5 09/20/2019 0550   CL 101 09/20/2019 0550   CO2 20 (L) 09/20/2019 0550   GLUCOSE 168 (H) 09/20/2019 0550   BUN 9 09/20/2019 0550   CREATININE 0.81 09/20/2019 0550   CALCIUM 8.8 (L) 09/20/2019 0550   PROT 5.8 (L) 09/18/2019 0751   ALBUMIN 2.7 (L) 09/18/2019 0751   AST 18 09/18/2019 0751   ALT 16 09/18/2019 0751   ALKPHOS 65 09/18/2019 0751   BILITOT 0.9 09/18/2019 0751   GFRNONAA >60 09/20/2019 0550   GFRAA >60 09/20/2019 0550   Lipase     Component Value Date/Time   LIPASE 27 09/17/2019 0346       Studies/Results: ECHOCARDIOGRAM COMPLETE  Result Date: 09/18/2019    ECHOCARDIOGRAM REPORT   Patient  Name:   DESTONY Micucci Date of Exam: 09/18/2019 Medical Rec #:  [id number]    Height:       51.0 in Accession #:    [id number]   Weight:       120.0 lb Date of Birth:  12/11/1924    BSA:          1.335 m Patient Age:    86 years     BP:           143/46 mmHg Patient Gender: F            HR:           71 bpm. Exam Location:  Inpatient Procedure: 2D Echo, Color Doppler and Cardiac Doppler Indications:    Pre-operative cardiovascular exam z01.810  History:        Patient has no prior history of Echocardiogram examinations.                 Pacemaker; Risk Factors:Hypertension and Dyslipidemia.  Sonographer:    Irving Burton Senior RDCS Referring Phys: [phone] JESSICA U [facility]  Sonographer Comments: Technically difficult parasternal window due to small rib spacing. IMPRESSIONS  1. Left ventricular ejection fraction, by estimation, is 65 to 70%. The left ventricle has normal function. The left ventricle  has no regional wall motion abnormalities. Left ventricular diastolic parameters are consistent with Grade I diastolic dysfunction (impaired relaxation). Elevated left ventricular end-diastolic pressure.  2. Right ventricular systolic function is normal. The right ventricular size is normal. There is mildly elevated pulmonary artery systolic pressure.  3. Left atrial size was moderately dilated.  4. The mitral valve is abnormal. Mild mitral valve regurgitation.  5. The aortic valve is tricuspid. Aortic valve regurgitation is not visualized.  6. The inferior vena cava is normal in size with greater than 50% respiratory variability, suggesting right atrial pressure of 3 mmHg. FINDINGS  Left Ventricle: Left ventricular ejection fraction, by estimation, is 65 to 70%. The left ventricle has normal function. The left ventricle has no regional wall motion abnormalities. The left ventricular internal cavity size was normal in size. There is  no left ventricular hypertrophy. Left ventricular diastolic parameters are consistent with Grade I diastolic  dysfunction (impaired relaxation). Elevated left ventricular end-diastolic pressure. Right Ventricle: The right ventricular size is normal. No increase in right ventricular wall thickness. Right ventricular systolic function is normal. There is mildly elevated pulmonary artery systolic pressure. The tricuspid regurgitant velocity is 2.96  m/s, and with an assumed right atrial pressure of 3 mmHg, the estimated right ventricular systolic pressure is 47.8 mmHg. Left Atrium: Left atrial size was moderately dilated. Right Atrium: Right atrial size was normal in size. Pericardium: There is no evidence of pericardial effusion. Mitral Valve: The mitral valve is abnormal. There is mild thickening of the mitral valve leaflet(s). Mild mitral valve regurgitation. Tricuspid Valve: The tricuspid valve is grossly normal. Tricuspid valve regurgitation is trivial. Aortic Valve: The aortic valve is tricuspid. Aortic valve regurgitation is not visualized. Pulmonic Valve: The pulmonic valve was grossly normal. Pulmonic valve regurgitation is not visualized. Aorta: The aortic root and ascending aorta are structurally normal, with no evidence of dilitation. Venous: The inferior vena cava is normal in size with greater than 50% respiratory variability, suggesting right atrial pressure of 3 mmHg. IAS/Shunts: No atrial level shunt detected by color flow Doppler. Additional Comments: A pacer wire is visualized.  LEFT VENTRICLE PLAX 2D LVIDd:         4.10 cm  Diastology LVIDs:         2.70 cm  LV e' lateral:   5.00 cm/s LV PW:         1.00 cm  LV E/e' lateral: 20.2 LV IVS:        0.90 cm  LV e' medial:    5.77 cm/s LVOT diam:     1.80 cm  LV E/e' medial:  17.5 LV SV:         69 LV SV Index:   51 LVOT Area:     2.54 cm  RIGHT VENTRICLE RV S prime:     12.70 cm/s TAPSE (M-mode): 2.5 cm LEFT ATRIUM             Index       RIGHT ATRIUM           Index LA diam:        3.70 cm 2.77 cm/m  RA Area:     11.40 cm LA Vol (A2C):   42.8 ml 32.06 ml/m  RA Volume:   23.60 ml  17.68 ml/m LA Vol (A4C):   67.1 ml 50.26 ml/m LA Biplane Vol: 56.9 ml 42.62 ml/m  AORTIC VALVE LVOT Vmax:   105.00 cm/s LVOT Vmean:  75.200 cm/s LVOT VTI:  0.270 m  AORTA Ao Root diam: 2.60 cm Ao Asc diam:  2.20 cm MITRAL VALVE                TRICUSPID VALVE MV Area (PHT): 2.69 cm     TR Peak grad:   35.0 mmHg MV Decel Time: 282 msec     TR Vmax:        296.00 cm/s MV E velocity: 101.00 cm/s MV A velocity: 142.00 cm/s  SHUNTS MV E/A ratio:  0.71         Systemic VTI:  0.27 m                             Systemic Diam: 1.80 cm Zoila Shutter MD Electronically signed by Zoila Shutter MD Signature Date/Time: 09/18/2019/1:07:49 PM    Final     Anti-infectives: Anti-infectives (From admission, onward)   Start     Dose/Rate Route Frequency Ordered Stop   09/18/19 0700  cefTRIAXone (ROCEPHIN) 2 g in sodium chloride 0.9 % 100 mL IVPB     2 g 200 mL/hr over 30 Minutes Intravenous Every 24 hours 09/17/19 1827     09/17/19 0645  cefTRIAXone (ROCEPHIN) 2 g in sodium chloride 0.9 % 100 mL IVPB     2 g 200 mL/hr over 30 Minutes Intravenous  Once 09/17/19 0644 09/17/19 0757       Assessment/Plan HTN HLD Asthma S/p pacemaker Hyponatremia Anemia   ACUTE CHOLECYSTITIS S/p LAPAROSCOPIC CHOLECYSTECTOMY 6/8 Dr. Derrell Lolling - POD#1 - needs 10 total days of antibiotics  ID -rocephin 6/6>>day#3/10 VTE -SCDs, lovenox FEN -IVF, CLD, Boost breeze Foley -none Follow up -[facility]   Plan - Add compazine for breakthrough nausea. Add colace. Continue clears until nausea improves. Recommend getting OOB today. Will ask PT to see. Suspect she will not be ready for discharge today.    LOS: 3 days    Franne Forts, [facility] 09/20/2019, 8:17 AM Please see Amion for pager number during day hours 7:00am-4:30pm

## 2019-09-20 NOTE — TOC Initial Note (Signed)
Transition of Care [facility]) - Initial/Assessment Note    Patient Details  Name: Sarah Burns MRN: [id number] Date of Birth: [birth year]  [facility] CM/SW Contact:    Doy Hutching, LCSW Phone Number: 09/20/2019, 5:08 PM  Clinical Narrative:                 CSW spoke with pt daughter via telephone due to pt post surgical nausea and that she lives with her daughter.   Junious Dresser at [phone]. Introduced self, role, reason for visit. Pt from home with daughter. Confirmed home address and PCP. Pt recently moved from [place] and uses a cane to get around. She also has a walker and rollator. Pt daughter unsure if she had HH in [place] since she really didn't have many health challenges per daughter report. Daughter understands recommendations and thinks she and pt would prefer HH if appropriate. TOC team to f/u with pt after further therapy eval.     Expected Discharge Plan: Home w Home Health Services Barriers to Discharge: Continued Medical Work up   Patient Goals and CMS Choice Patient states their goals for this hospitalization and ongoing recovery are:: for her to get stronger/return home CMS Medicare.gov Compare Post Acute Care list provided to:: Patient Represenative (must comment)(pt daughter) Choice offered to / list presented to : Adult Children, Patient  Expected Discharge Plan and Services Expected Discharge Plan: Home w Home Health Services In-house Referral: Clinical Social Work Discharge Planning Services: CM Consult Post Acute Care Choice: Durable Medical Equipment, Home Health Living arrangements for the past 2 months: Single Family Home  Prior Living Arrangements/Services Living arrangements for the past 2 months: Single Family Home Lives with:: Adult Children Patient language and need for interpreter reviewed:: Yes(no needs) Do you feel safe going back to the place where you live?: Yes      Need for Family Participation in Patient Care: Yes  (Comment)(assistance/supervision as needed) Care giver support system in place?: Yes (comment)(pt daughter) Current home services: DME Criminal Activity/Legal Involvement Pertinent to Current Situation/Hospitalization: No - Comment as needed   Permission Sought/Granted Permission sought to share information with : Facility Medical sales representative, Family Supports    Share Information with NAME: Sarah Burns  Permission granted to share info w AGENCY: [facility]  Permission granted to share info w Relationship: daughter  Permission granted to share info w Contact Information: [phone]  Emotional Assessment Appearance:: Other (Comment Required(telephonic assessment w/ daughter) Attitude/Demeanor/Rapport: Other (comment)(telephonic assessment w/ daughter) Affect (typically observed): Other (comment)(telephonic assessment with daughter) Orientation: : Oriented to Self, Oriented to Place, Oriented to  Time, Oriented to Situation Alcohol / Substance Use: Not Applicable Psych Involvement: No (comment)  Admission diagnosis:  Acute cholecystitis [K81.0] Hyponatremia [E87.1] Normochromic normocytic anemia [D64.9] Patient Active Problem List   Diagnosis Date Noted  . Pressure injury of skin 09/18/2019  . Hyponatremia 09/18/2019  . Anemia 09/18/2019  . Hypertension   . Asthma   . Allergies   . Hyperlipidemia   . Acute cholecystitis 09/17/2019   PCP:  Renaye Rakers, MD Pharmacy:   [facility] [phone] G[place] Otto, [place] - [address] AT [address] [place] [address] Phone: [phone] Fax: [phone]  [facility] - [place], [place] - [address] Portal to Registered [facility] Sites [address] [place] [address] Phone: [phone] Fax: [phone]   Readmission Risk Interventions Readmission Risk Prevention Plan 09/20/2019  Transportation  Screening Complete  PCP or Specialist Appt within 5-7  Days Complete  Home Care Screening Complete  Medication Review (RN CM) Referral to Pharmacy

## 2019-09-20 NOTE — Evaluation (Signed)
Physical Therapy Evaluation Patient Details Name: Sarah Burns MRN: [id number] DOB: [birth year] Today's Date: 09/20/2019   History of Present Illness  Pt is a 86 y/o female admitted secondadry to acute cholecystitis. Pt is s/p laparascopic cholecystectomy. PMH includes HTN, asthma, and s/p pacemaker.   Clinical Impression  Pt admitted secondary to problem above with deficits below. Pt very slow to move and only able to tolerate a very short gait distance. Pt with increased pain and nausea and had episode of vomiting; RN notified. Anticipate pt will progress well once symptoms improve, however, if she does not progress, may require ST SNF at d/c. Will continue to follow acutely and update recommendations as appropriate.     Follow Up Recommendations Supervision for mobility/OOB(HHPT vs SNF pending progression )    Equipment Recommendations  3in1 (PT)    Recommendations for Other Services       Precautions / Restrictions Precautions Precautions: Fall Restrictions Weight Bearing Restrictions: No      Mobility  Bed Mobility Overal bed mobility: Needs Assistance Bed Mobility: Supine to Sit;Sit to Supine     Supine to sit: Mod assist Sit to supine: Mod assist   General bed mobility comments: Mod A for assist with trunk and LEs. Increased time to perform secondary to pain.   Transfers Overall transfer level: Needs assistance Equipment used: Rolling walker (2 wheeled) Transfers: Sit to/from Stand Sit to Stand: Min assist         General transfer comment: Min A for lift assist and steadying to stand. Pt with preference to pull up on RW. Very flexed posture.   Ambulation/Gait Ambulation/Gait assistance: Min assist Gait Distance (Feet): 2 Feet Assistive device: Rolling walker (2 wheeled) Gait Pattern/deviations: Step-through pattern;Decreased stride length;Trunk flexed Gait velocity: Decreased   General Gait Details: Very flexed posture and very slow to move. Pt only able to  tolerate a few steps before having to sit. Pt with episode of vomiting upon sitting. Further mobility deferred.   Stairs            Wheelchair Mobility    Modified Rankin (Stroke Patients Only)       Balance Overall balance assessment: Needs assistance Sitting-balance support: No upper extremity supported;Feet supported Sitting balance-Leahy Scale: Fair     Standing balance support: Bilateral upper extremity supported Standing balance-Leahy Scale: Poor Standing balance comment: Reliant on UE support                              Pertinent Vitals/Pain Pain Assessment: Faces Faces Pain Scale: Hurts whole lot Pain Location: abdomen Pain Descriptors / Indicators: Aching;Guarding;Grimacing Pain Intervention(s): Limited activity within patient's tolerance;Monitored during session;Repositioned    Home Living Family/patient expects to be discharged to:: Private residence Living Arrangements: Children Available Help at Discharge: Family Type of Home: House Home Access: Stairs to enter Entrance Stairs-Rails: Right;Left;Can reach both Technical brewer of Steps: 3 Home Layout: One level Home Equipment: Environmental consultant - 2 wheels;Walker - 4 wheels;Cane - single point      Prior Function Level of Independence: Independent               Hand Dominance        Extremity/Trunk Assessment   Upper Extremity Assessment Upper Extremity Assessment: Defer to OT evaluation    Lower Extremity Assessment Lower Extremity Assessment: Generalized weakness    Cervical / Trunk Assessment Cervical / Trunk Assessment: Kyphotic  Communication   Communication: No difficulties  Cognition  Arousal/Alertness: Awake/alert Behavior During Therapy: WFL for tasks assessed/performed Overall Cognitive Status: Within Functional Limits for tasks assessed                                        General Comments      Exercises     Assessment/Plan    PT  Assessment Patient needs continued PT services  PT Problem List Decreased strength;Decreased balance;Decreased activity tolerance;Decreased mobility;Decreased knowledge of use of DME;Decreased knowledge of precautions;Pain       PT Treatment Interventions Gait training;DME instruction;Stair training;Therapeutic exercise;Therapeutic activities;Functional mobility training;Balance training;Patient/family education    PT Goals (Current goals can be found in the Care Plan section)  Acute Rehab PT Goals Patient Stated Goal: to feel better PT Goal Formulation: With patient Time For Goal Achievement: 10/04/19 Potential to Achieve Goals: Good    Frequency Min 3X/week   Barriers to discharge        Co-evaluation               AM-PAC PT "6 Clicks" Mobility  Outcome Measure Help needed turning from your back to your side while in a flat bed without using bedrails?: A Lot Help needed moving from lying on your back to sitting on the side of a flat bed without using bedrails?: A Lot Help needed moving to and from a bed to a chair (including a wheelchair)?: A Little Help needed standing up from a chair using your arms (e.g., wheelchair or bedside chair)?: A Little Help needed to walk in hospital room?: A Lot Help needed climbing 3-5 steps with a railing? : A Lot 6 Click Score: 14    End of Session Equipment Utilized During Treatment: Gait belt Activity Tolerance: Patient limited by pain;Treatment limited secondary to medical complications (Comment)(nausea/vomiting) Patient left: in bed;with call bell/phone within reach;with bed alarm set;with nursing/sitter in room Nurse Communication: Mobility status;Other (comment)(pt with episode of vomiting ) PT Visit Diagnosis: Unsteadiness on feet (R26.81);Muscle weakness (generalized) (M62.81);Pain Pain - part of body: (abdomen)    Time: 9924-2683 PT Time Calculation (min) (ACUTE ONLY): 38 min   Charges:   PT Evaluation $PT Eval Moderate  Complexity: 1 Mod PT Treatments $Therapeutic Activity: 38-52 mins        Cindee Salt, DPT  Acute Rehabilitation Services  Pager: [phone] Office: [phone]   Lehman Prom 09/20/2019, 12:18 PM

## 2019-09-20 NOTE — Progress Notes (Signed)
PROGRESS NOTE    Sarah Burns  OZH:[id number] DOB: May 16, 1924 DOA: 09/17/2019 PCP: Renaye RakersBland, Veita, MD   Brief Narrative:  Sarah Burns is an 86 y.o. female with a past medical history that includes hypertension, hyperlipidemia, asthma, pacemaker admitted6/436from home with a chief complaint abdominal pain/nausea and vomiting. Work-up in the emergency department included a CT scan that noted acute cholecystitis. Patient also has a 1 cm stone in the cystic duct. Lab work reveals hyponatremia leukocytosis.  She was admitted under hospital service.  Medical management was started with antibiotics. General surgery consulted.  Initial plan was to continue medical management as patient wanted to avoid surgery but eventually patient ended up having laparoscopic cholecystectomy on 09/19/2019.  Assessment & Plan:   Principal Problem:   Acute cholecystitis Active Problems:   Pressure injury of skin   Hyponatremia   Hypertension   Asthma   Allergies   Hyperlipidemia   Anemia   Acute cholecystitis: Status post laparoscopic cholecystectomy on 6/21.  Patient still with some nausea.  Surgery managing.  Slightly worsened leukocytosis but no fever.  Plan to continue clears until she is better.  Hyponatremia: Resolved.  Essentialhypertension: Elevated.  Will resume home dose of losartan.  Continue amlodipine.  Add as needed hydralazine.    Mild intermittent asthma. Medications include inhalers. Stable at baseline  Anemia of chronic disease: Hemoglobin is stable.  Hyperlipidemia: Resume statin.  obesity Body mass index is 32.44 kg/m.  DVT prophylaxis: Lovenox   Code Status: Full Code  Family Communication:  None present at bedside.  Plan of care discussed with patient in length and he verbalized understanding and agreed with it. Patient is from: Home Disposition Plan: Home Barriers to discharge: Still with nausea/PT assessment pending  Status is: Inpatient  Remains inpatient  appropriate because:IV treatments appropriate due to intensity of illness or inability to take PO   Dispo: The patient is from: Home              Anticipated d/c is to: TBD              Anticipated d/c date is: 1 day              Patient currently is not medically stable to d/c.        Estimated body mass index is 32.44 kg/m as calculated from the following:   Height as of this encounter: 4\' 3"  (1.295 m).   Weight as of this encounter: 54.4 kg.  Pressure Injury 09/17/19 Buttocks Mid Stage 2 -  Partial thickness loss of dermis presenting as a shallow open injury with a red, pink wound bed without slough. (Active)  09/17/19 2247  Location: Buttocks  Location Orientation: Mid  Staging: Stage 2 -  Partial thickness loss of dermis presenting as a shallow open injury with a red, pink wound bed without slough.  Wound Description (Comments):   Present on Admission: Yes     Nutritional status:               Consultants:   General surgery  Procedures:   Laparoscopic cholecystectomy 09/19/2019  Antimicrobials:  Anti-infectives (From admission, onward)   Start     Dose/Rate Route Frequency Ordered Stop   09/18/19 0700  cefTRIAXone (ROCEPHIN) 2 g in sodium chloride 0.9 % 100 mL IVPB     2 g 200 mL/hr over 30 Minutes Intravenous Every 24 hours 09/17/19 1827     09/17/19 0645  cefTRIAXone (ROCEPHIN) 2 g in sodium chloride 0.9 % 100  mL IVPB     2 g 200 mL/hr over 30 Minutes Intravenous  Once 09/17/19 0644 09/17/19 0757         Subjective: Patient seen and examined.  Still complains of generalized abdominal pain and nausea but no vomiting.  No other complaint.  Objective: Vitals:   09/19/19 2107 09/19/19 2207 09/20/19 0531 09/20/19 0825  BP:  (!) 153/70 (!) 183/57 (!) 184/58  Pulse:  87 99   Resp:  18 17   Temp:  97.6 F (36.4 C) 98.3 F (36.8 C)   TempSrc:  Oral Oral   SpO2: 96% 96% 96%   Weight:      Height:        Intake/Output Summary (Last 24 hours)  at 09/20/2019 1055 Last data filed at 09/20/2019 0535 Gross per 24 hour  Intake 2138.91 ml  Output 1525 ml  Net 613.91 ml   Filed Weights   09/17/19 0304 09/19/19 1330  Weight: 54.4 kg 54.4 kg    Examination:  General exam: Appears calm and comfortable  Respiratory system: Clear to auscultation. Respiratory effort normal. Cardiovascular system: S1 & S2 heard, RRR. No JVD, murmurs, rubs, gallops or clicks. No pedal edema. Gastrointestinal system: Abdomen is nondistended, soft but mild to moderate generalized tenderness. No organomegaly or masses felt. Normal bowel sounds heard. Central nervous system: Alert and oriented. No focal neurological deficits. Extremities: Symmetric 5 x 5 power. Skin: No rashes, lesions or ulcers Psychiatry: Judgement and insight appear normal. Mood & affect appropriate.    Data Reviewed: I have personally reviewed following labs and imaging studies  CBC: Recent Labs  Lab 09/17/19 0346 09/18/19 0751 09/19/19 0716 09/20/19 0550  WBC 21.8* 20.4* 14.7* 16.7*  NEUTROABS 20.0*  --   --   --   HGB 11.6* 9.7* 9.4* 11.1*  HCT 34.3* 29.0* 28.3* 32.9*  MCV 91.0 92.1 92.5 91.4  PLT 295 230 229 310   Basic Metabolic Panel: Recent Labs  Lab 09/17/19 0346 09/17/19 1543 09/18/19 0751 09/19/19 0716 09/20/19 0550  NA 126* 127* 128* 130* 135  K 4.1 4.5 3.8 3.3* 4.5  CL 91* 94* 98 101 101  CO2 22 22 21* 21* 20*  GLUCOSE 161* 112* 123* 118* 168*  BUN 17 20 16 9 9   CREATININE 1.09* 1.13* 1.01* 0.86 0.81  CALCIUM 9.4 8.7* 8.4* 8.1* 8.8*   GFR: Estimated Creatinine Clearance: 25.1 mL/min (by C-G formula based on SCr of 0.81 mg/dL). Liver Function Tests: Recent Labs  Lab 09/17/19 0346 09/18/19 0751  AST 24 18  ALT 19 16  ALKPHOS 64 65  BILITOT 0.8 0.9  PROT 6.8 5.8*  ALBUMIN 3.5 2.7*   Recent Labs  Lab 09/17/19 0346  LIPASE 27   No results for input(s): AMMONIA in the last 168 hours. Coagulation Profile: No results for input(s): INR, PROTIME  in the last 168 hours. Cardiac Enzymes: No results for input(s): CKTOTAL, CKMB, CKMBINDEX, TROPONINI in the last 168 hours. BNP (last 3 results) No results for input(s): PROBNP in the last 8760 hours. HbA1C: No results for input(s): HGBA1C in the last 72 hours. CBG: No results for input(s): GLUCAP in the last 168 hours. Lipid Profile: No results for input(s): CHOL, HDL, LDLCALC, TRIG, CHOLHDL, LDLDIRECT in the last 72 hours. Thyroid Function Tests: No results for input(s): TSH, T4TOTAL, FREET4, T3FREE, THYROIDAB in the last 72 hours. Anemia Panel: Recent Labs    09/18/19 0944 09/18/19 1030  VITAMINB12 259  --   FOLATE 8.7  --  FERRITIN 138  --   TIBC 223*  --   IRON 11*  --   RETICCTPCT  --  1.5   Sepsis Labs: No results for input(s): PROCALCITON, LATICACIDVEN in the last 168 hours.  Recent Results (from the past 240 hour(s))  SARS Coronavirus 2 by RT PCR (hospital order, performed in [facility] hospital lab) Nasopharyngeal Nasopharyngeal Swab     Status: None   Collection Time: 09/17/19  7:28 AM   Specimen: Nasopharyngeal Swab  Result Value Ref Range Status   SARS Coronavirus 2 NEGATIVE NEGATIVE Final    Comment: (NOTE) SARS-CoV-2 target nucleic acids are NOT DETECTED. The SARS-CoV-2 RNA is generally detectable in upper and lower respiratory specimens during the acute phase of infection. The lowest concentration of SARS-CoV-2 viral copies this assay can detect is 250 copies / mL. A negative result does not preclude SARS-CoV-2 infection and should not be used as the sole basis for treatment or other patient management decisions.  A negative result may occur with improper specimen collection / handling, submission of specimen other than nasopharyngeal swab, presence of viral mutation(s) within the areas targeted by this assay, and inadequate number of viral copies (<250 copies / mL). A negative result must be combined with clinical observations, patient history, and  epidemiological information. Fact Sheet for Patients:   BoilerBrush.com.cy Fact Sheet for Healthcare Providers: https://pope.com/ This test is not yet approved or cleared  by the [place] FDA and has been authorized for detection and/or diagnosis of SARS-CoV-2 by FDA under an Emergency Use Authorization (EUA).  This EUA will remain in effect (meaning this test can be used) for the duration of the COVID-19 declaration under Section 564(b)(1) of the Act, 21 U.S.C. section 360bbb-3(b)(1), unless the authorization is terminated or revoked sooner. Performed at [facility], [address] [place], [place] [address]   Culture, Urine     Status: None   Collection Time: 09/18/19 10:31 AM   Specimen: Urine, Clean Catch  Result Value Ref Range Status   Specimen Description URINE, CLEAN CATCH  Final   Special Requests NONE  Final   Culture   Final    NO GROWTH Performed at [facility], [address] [place], [place] [address]    Report Status 09/19/2019 FINAL  Final  Surgical pcr screen     Status: None   Collection Time: 09/19/19  6:25 AM   Specimen: Nasal Mucosa; Nasal Swab  Result Value Ref Range Status   MRSA, PCR NEGATIVE NEGATIVE Final   Staphylococcus aureus NEGATIVE NEGATIVE Final    Comment: (NOTE) The Xpert SA Assay (FDA approved for NASAL specimens in patients 9 years of age and older), is one component of a comprehensive surveillance program. It is not intended to diagnose infection nor to guide or monitor treatment. Performed at [facility], [address], [place], [place] [address]       Radiology Studies: ECHOCARDIOGRAM COMPLETE  Result Date: 09/18/2019    ECHOCARDIOGRAM REPORT   Patient Name:   KAHLAN Thackston Date of Exam: 09/18/2019 Medical Rec #:  [id number]    Height:       51.0 in Accession #:    [id number]   Weight:       120.0 lb Date of Birth:  November 20, 1924    BSA:          1.335 m Patient  Age:    86 years     BP:  143/46 mmHg Patient Gender: F            HR:           71 bpm. Exam Location:  Inpatient Procedure: 2D Echo, Color Doppler and Cardiac Doppler Indications:    Pre-operative cardiovascular exam z01.810  History:        Patient has no prior history of Echocardiogram examinations.                 Pacemaker; Risk Factors:Hypertension and Dyslipidemia.  Sonographer:    Raquel Sarna Senior RDCS Referring Phys: [phone] [facility]  Sonographer Comments: Technically difficult parasternal window due to small rib spacing. IMPRESSIONS  1. Left ventricular ejection fraction, by estimation, is 65 to 70%. The left ventricle has normal function. The left ventricle has no regional wall motion abnormalities. Left ventricular diastolic parameters are consistent with Grade I diastolic dysfunction (impaired relaxation). Elevated left ventricular end-diastolic pressure.  2. Right ventricular systolic function is normal. The right ventricular size is normal. There is mildly elevated pulmonary artery systolic pressure.  3. Left atrial size was moderately dilated.  4. The mitral valve is abnormal. Mild mitral valve regurgitation.  5. The aortic valve is tricuspid. Aortic valve regurgitation is not visualized.  6. The inferior vena cava is normal in size with greater than 50% respiratory variability, suggesting right atrial pressure of 3 mmHg. FINDINGS  Left Ventricle: Left ventricular ejection fraction, by estimation, is 65 to 70%. The left ventricle has normal function. The left ventricle has no regional wall motion abnormalities. The left ventricular internal cavity size was normal in size. There is  no left ventricular hypertrophy. Left ventricular diastolic parameters are consistent with Grade I diastolic dysfunction (impaired relaxation). Elevated left ventricular end-diastolic pressure. Right Ventricle: The right ventricular size is normal. No increase in right ventricular wall thickness. Right ventricular  systolic function is normal. There is mildly elevated pulmonary artery systolic pressure. The tricuspid regurgitant velocity is 2.96  m/s, and with an assumed right atrial pressure of 3 mmHg, the estimated right ventricular systolic pressure is 32.6 mmHg. Left Atrium: Left atrial size was moderately dilated. Right Atrium: Right atrial size was normal in size. Pericardium: There is no evidence of pericardial effusion. Mitral Valve: The mitral valve is abnormal. There is mild thickening of the mitral valve leaflet(s). Mild mitral valve regurgitation. Tricuspid Valve: The tricuspid valve is grossly normal. Tricuspid valve regurgitation is trivial. Aortic Valve: The aortic valve is tricuspid. Aortic valve regurgitation is not visualized. Pulmonic Valve: The pulmonic valve was grossly normal. Pulmonic valve regurgitation is not visualized. Aorta: The aortic root and ascending aorta are structurally normal, with no evidence of dilitation. Venous: The inferior vena cava is normal in size with greater than 50% respiratory variability, suggesting right atrial pressure of 3 mmHg. IAS/Shunts: No atrial level shunt detected by color flow Doppler. Additional Comments: A pacer wire is visualized.  LEFT VENTRICLE PLAX 2D LVIDd:         4.10 cm  Diastology LVIDs:         2.70 cm  LV e' lateral:   5.00 cm/s LV PW:         1.00 cm  LV E/e' lateral: 20.2 LV IVS:        0.90 cm  LV e' medial:    5.77 cm/s LVOT diam:     1.80 cm  LV E/e' medial:  17.5 LV SV:         69 LV SV Index:  51 LVOT Area:     2.54 cm  RIGHT VENTRICLE RV S prime:     12.70 cm/s TAPSE (M-mode): 2.5 cm LEFT ATRIUM             Index       RIGHT ATRIUM           Index LA diam:        3.70 cm 2.77 cm/m  RA Area:     11.40 cm LA Vol (A2C):   42.8 ml 32.06 ml/m RA Volume:   23.60 ml  17.68 ml/m LA Vol (A4C):   67.1 ml 50.26 ml/m LA Biplane Vol: 56.9 ml 42.62 ml/m  AORTIC VALVE LVOT Vmax:   105.00 cm/s LVOT Vmean:  75.200 cm/s LVOT VTI:    0.270 m  AORTA Ao Root  diam: 2.60 cm Ao Asc diam:  2.20 cm MITRAL VALVE                TRICUSPID VALVE MV Area (PHT): 2.69 cm     TR Peak grad:   35.0 mmHg MV Decel Time: 282 msec     TR Vmax:        296.00 cm/s MV E velocity: 101.00 cm/s MV A velocity: 142.00 cm/s  SHUNTS MV E/A ratio:  0.71         Systemic VTI:  0.27 m                             Systemic Diam: 1.80 cm Zoila Shutter MD Electronically signed by Zoila Shutter MD Signature Date/Time: 09/18/2019/1:07:49 PM    Final     Scheduled Meds: . amLODipine  10 mg Oral Daily  . docusate sodium  100 mg Oral BID  . feeding supplement  1 Container Oral BID BM  . mometasone-formoterol  2 puff Inhalation BID   Continuous Infusions: . sodium chloride 75 mL/hr at 09/20/19 0535  . cefTRIAXone (ROCEPHIN)  IV 2 g (09/20/19 0824)     LOS: 3 days   Time spent: 35 minutes   Hughie Closs, MD [facility]  09/20/2019, 10:55 AM   To contact the attending provider between 7A-7P or the covering provider during after hours 7P-7A, please log into the web site [email or web address].

## 2019-09-20 NOTE — Discharge Instructions (Signed)
[facility] ° °Please arrive at least 30 min before your appointment to complete your check in paperwork.  If you are unable to arrive 30 min prior to your appointment time we may have to cancel or reschedule you. °LAPAROSCOPIC SURGERY: POST OP INSTRUCTIONS °Always review your discharge instruction sheet given to you by the facility where your surgery was performed. °IF YOU HAVE DISABILITY OR FAMILY LEAVE FORMS, YOU MUST BRING THEM TO THE OFFICE FOR PROCESSING.   °DO NOT GIVE THEM TO YOUR DOCTOR. ° °PAIN CONTROL ° °1. First take acetaminophen (Tylenol) AND/or ibuprofen (Advil) to control your pain after surgery.  Follow directions on package.  Taking acetaminophen (Tylenol) and/or ibuprofen (Advil) regularly after surgery will help to control your pain and lower the amount of prescription pain medication you may need.  You should not take more than 4,000 mg (4 grams) of acetaminophen (Tylenol) in 24 hours.  You should not take ibuprofen (Advil), aleve, motrin, naprosyn or other NSAIDS if you have a history of stomach ulcers or chronic kidney disease.  °2. A prescription for pain medication may be given to you upon discharge.  Take your pain medication as prescribed, if you still have uncontrolled pain after taking acetaminophen (Tylenol) or ibuprofen (Advil). °3. Use ice packs to help control pain. °4. If you need a refill on your pain medication, please contact your pharmacy.  They will contact our office to request authorization. Prescriptions will not be filled after 5pm or on week-ends. ° °HOME MEDICATIONS °5. Take your usually prescribed medications unless otherwise directed. ° °DIET °6. You should follow a light diet the first few days after arrival home.  Be sure to include lots of fluids daily. Avoid fatty, fried foods.  ° °CONSTIPATION °7. It is common to experience some constipation after surgery and if you are taking pain medication.  Increasing fluid intake and taking a stool  softener (such as Colace) will usually help or prevent this problem from occurring.  A mild laxative (Milk of Magnesia or Miralax) should be taken according to package instructions if there are no bowel movements after 48 hours. ° °WOUND/INCISION CARE °8. Most patients will experience some swelling and bruising in the area of the incisions.  Ice packs will help.  Swelling and bruising can take several days to resolve.  °9. Unless discharge instructions indicate otherwise, follow guidelines below  °a. STERI-STRIPS - you may remove your outer bandages 48 hours after surgery, and you may shower at that time.  You have steri-strips (small skin tapes) in place directly over the incision.  These strips should be left on the skin for 7-10 days.   °b. DERMABOND/SKIN GLUE - you may shower in 24 hours.  The glue will flake off over the next 2-3 weeks. °10. Any sutures or staples will be removed at the office during your follow-up visit. ° °ACTIVITIES °11. You may resume regular (light) daily activities beginning the next day--such as daily self-care, walking, climbing stairs--gradually increasing activities as tolerated.  You may have sexual intercourse when it is comfortable.  Refrain from any heavy lifting or straining until approved by your doctor. °a. You may drive when you are no longer taking prescription pain medication, you can comfortably wear a seatbelt, and you can safely maneuver your car and apply brakes. ° °FOLLOW-UP °12. You should see your doctor in the office for a follow-up appointment approximately 2-3 weeks after your surgery.  You should have been given your post-op/follow-up appointment when   your surgery was scheduled.  If you did not receive a post-op/follow-up appointment, make sure that you call for this appointment within a day or two after you arrive home to insure a convenient appointment time. ° °OTHER INSTRUCTIONS ° °WHEN TO CALL YOUR DOCTOR: °1. Fever over 101.0 °2. Inability to  urinate °3. Continued bleeding from incision. °4. Increased pain, redness, or drainage from the incision. °5. Increasing abdominal pain ° °The clinic staff is available to answer your questions during regular business hours.  Please don’t hesitate to call and ask to speak to one of the nurses for clinical concerns.  If you have a medical emergency, go to the nearest emergency room or call 911.  A surgeon from [facility] is always on call at the hospital. °[address], [address], [place], [place]  [address] ? [address] [address], [place], [place]   [address] °[phone] ? [phone] ? FAX [phone] ° ° ° °

## 2019-09-20 NOTE — Social Work (Signed)
HIPAA compliant message left for pt daughter Gavin Pound [phone].  Octavio Graves, MSW, LCSW [facility] Clinical Social Work

## 2019-09-21 LAB — CBC WITH DIFFERENTIAL/PLATELET
Abs Immature Granulocytes: 0.35 10*3/uL — ABNORMAL HIGH (ref 0.00–0.07)
Basophils Absolute: 0.1 10*3/uL (ref 0.0–0.1)
Basophils Relative: 0 %
Eosinophils Absolute: 0.1 10*3/uL (ref 0.0–0.5)
Eosinophils Relative: 0 %
HCT: 28.2 % — ABNORMAL LOW (ref 36.0–46.0)
Hemoglobin: 9.3 g/dL — ABNORMAL LOW (ref 12.0–15.0)
Immature Granulocytes: 2 %
Lymphocytes Relative: 5 %
Lymphs Abs: 1.1 10*3/uL (ref 0.7–4.0)
MCH: 30.3 pg (ref 26.0–34.0)
MCHC: 33 g/dL (ref 30.0–36.0)
MCV: 91.9 fL (ref 80.0–100.0)
Monocytes Absolute: 1.5 10*3/uL — ABNORMAL HIGH (ref 0.1–1.0)
Monocytes Relative: 7 %
Neutro Abs: 19.4 10*3/uL — ABNORMAL HIGH (ref 1.7–7.7)
Neutrophils Relative %: 86 %
Platelets: 290 10*3/uL (ref 150–400)
RBC: 3.07 MIL/uL — ABNORMAL LOW (ref 3.87–5.11)
RDW: 13.2 % (ref 11.5–15.5)
WBC: 22.5 10*3/uL — ABNORMAL HIGH (ref 4.0–10.5)
nRBC: 0 % (ref 0.0–0.2)

## 2019-09-21 LAB — SURGICAL PATHOLOGY

## 2019-09-21 LAB — COMPREHENSIVE METABOLIC PANEL
ALT: 39 U/L (ref 0–44)
AST: 28 U/L (ref 15–41)
Albumin: 2.1 g/dL — ABNORMAL LOW (ref 3.5–5.0)
Alkaline Phosphatase: 82 U/L (ref 38–126)
Anion gap: 14 (ref 5–15)
BUN: 8 mg/dL (ref 8–23)
CO2: 19 mmol/L — ABNORMAL LOW (ref 22–32)
Calcium: 8.5 mg/dL — ABNORMAL LOW (ref 8.9–10.3)
Chloride: 101 mmol/L (ref 98–111)
Creatinine, Ser: 0.79 mg/dL (ref 0.44–1.00)
GFR calc Af Amer: >60 mL/min (ref >60)
GFR calc non Af Amer: >60 mL/min (ref >60)
Glucose, Bld: 146 mg/dL — ABNORMAL HIGH (ref 70–99)
Potassium: 3.4 mmol/L — ABNORMAL LOW (ref 3.5–5.1)
Sodium: 134 mmol/L — ABNORMAL LOW (ref 135–145)
Total Bilirubin: 0.5 mg/dL (ref 0.3–1.2)
Total Protein: 5.5 g/dL — ABNORMAL LOW (ref 6.5–8.1)

## 2019-09-21 LAB — MAGNESIUM: Magnesium: 1.9 mg/dL (ref 1.7–2.4)

## 2019-09-21 MED ORDER — BISACODYL 10 MG RE SUPP
10.0000 mg | Freq: Once | RECTAL | Status: AC
  Administered 2019-09-21: 10 mg via RECTAL
  Filled 2019-09-21: qty 1

## 2019-09-21 MED ORDER — POTASSIUM CHLORIDE CRYS ER 20 MEQ PO TBCR
40.0000 meq | EXTENDED_RELEASE_TABLET | Freq: Two times a day (BID) | ORAL | Status: AC
  Administered 2019-09-21 (×2): 40 meq via ORAL
  Filled 2019-09-21 (×2): qty 2

## 2019-09-21 MED ORDER — POTASSIUM CHLORIDE 10 MEQ/100ML IV SOLN
10.0000 meq | INTRAVENOUS | Status: AC
  Administered 2019-09-21 (×4): 10 meq via INTRAVENOUS
  Filled 2019-09-21 (×4): qty 100

## 2019-09-21 NOTE — Evaluation (Signed)
Occupational Therapy Evaluation Patient Details Name: Sarah Burns MRN: [id number] DOB: 1924-07-09 Today's Date: 09/21/2019    History of Present Illness Pt is a 86 y/o female admitted secondadry to acute cholecystitis. Pt is s/p laparascopic cholecystectomy. PMH includes HTN, asthma, and s/p pacemaker.    Clinical Impression   PTA patient reports completing ADLs and limited mobility with independence. Admitted for above and limited by problem list below, including generalized weakness, abdominal pain and decreased activity tolerance. Patient educated on importance of OOB and mobilization for pain mgmt and activity tolerance but patient declined.  Session bed level, limited eval. Patient completing eating, grooming with setup assist, UB ADls with min assist and anticipate increased assist with LB ADLS but plan to address next session.  Discussed DME, and patient reports low commodes at home therefore will benefit from 3:1 commode.  Believe patient will best benefit from [facility] services if she is able to have 24/7 support, but if not will need SNF.  Will follow.     Follow Up Recommendations  Home health OT;Supervision/Assistance - 24 hour;SNF (if doesn't have 24/7 assist, may need SNF )    Equipment Recommendations  3 in 1 bedside commode    Recommendations for Other Services       Precautions / Restrictions Precautions Precautions: Fall Restrictions Weight Bearing Restrictions: No      Mobility Bed Mobility               General bed mobility comments: pt declined OOB today   Transfers                      Balance                                           ADL either performed or assessed with clinical judgement   ADL Overall ADL's : Needs assistance/impaired Eating/Feeding: Set up;Bed level   Grooming: Wash/dry hands;Set up;Bed level   Upper Body Bathing: Minimal assistance;Bed level       Upper Body Dressing : Minimal assistance;Bed  level         Toilet Transfer Details (indicate cue type and reason): pt declined           General ADL Comments: Eval limited to bed level. Pt declined further mobilization due to pain and weakness.  Therapist encouraged OOB to prevent further weakness, but pt declined.      Vision   Vision Assessment?: No apparent visual deficits     Perception     Praxis      Pertinent Vitals/Pain Pain Assessment: Faces Faces Pain Scale: Hurts a little bit Pain Location: abdomen Pain Descriptors / Indicators: Grimacing Pain Intervention(s): Limited activity within patient's tolerance     Hand Dominance Right   Extremity/Trunk Assessment Upper Extremity Assessment Upper Extremity Assessment: Generalized weakness   Lower Extremity Assessment Lower Extremity Assessment: Defer to PT evaluation   Cervical / Trunk Assessment Cervical / Trunk Assessment: Kyphotic   Communication Communication Communication: No difficulties   Cognition Arousal/Alertness: Awake/alert Behavior During Therapy: Agitated;WFL for tasks assessed/performed Overall Cognitive Status: Within Functional Limits for tasks assessed                                 General Comments: Pt reported she had already walked around the bed 3x  and did not want to get up again.   General Comments  educated on importance of OOB activity to prevent further weakness, pt voiced understanding but declined     Exercises     Shoulder Instructions      Home Living Family/patient expects to be discharged to:: Private residence Living Arrangements: Children Available Help at Discharge: Family Type of Home: House Home Access: Stairs to enter Secretary/administrator of Steps: 3 Entrance Stairs-Rails: Right;Left;Can reach both Home Layout: One level     Bathroom Shower/Tub: Chief Strategy Officer: Standard     Home Equipment: Environmental consultant - 2 wheels;Walker - 4 wheels;Cane - single point   Additional  Comments: Pt reports her daughter told her there will be people coming to the house to help her      Prior Functioning/Environment Level of Independence: Independent        Comments: Pt does not drive or work        OT Problem List: Decreased strength;Decreased activity tolerance;Impaired balance (sitting and/or standing);Decreased knowledge of use of DME or AE;Decreased knowledge of precautions;Pain      OT Treatment/Interventions: Self-care/ADL training;DME and/or AE instruction;Energy conservation;Therapeutic activities;Patient/family education;Balance training    OT Goals(Current goals can be found in the care plan section) Acute Rehab OT Goals Patient Stated Goal: feel better and get home  OT Goal Formulation: With patient Time For Goal Achievement: 10/05/19 Potential to Achieve Goals: Good  OT Frequency: Min 2X/week   Barriers to D/C:            Co-evaluation              AM-PAC OT "6 Clicks" Daily Activity     Outcome Measure Help from another person eating meals?: A Little Help from another person taking care of personal grooming?: A Little Help from another person toileting, which includes using toliet, bedpan, or urinal?: A Lot Help from another person bathing (including washing, rinsing, drying)?: A Lot Help from another person to put on and taking off regular upper body clothing?: A Little Help from another person to put on and taking off regular lower body clothing?: A Lot 6 Click Score: 15   End of Session Nurse Communication: Mobility status  Activity Tolerance: Patient limited by fatigue Patient left: in bed;with call bell/phone within reach;with bed alarm set  OT Visit Diagnosis: Other abnormalities of gait and mobility (R26.89);Muscle weakness (generalized) (M62.81);Pain Pain - part of body:  (stomach)                Time: 6144-3154 OT Time Calculation (min): 18 min Charges:  OT General Charges $OT Visit: 1 Visit OT Evaluation $OT Eval  Moderate Complexity: 1 Mod  Barry Brunner, OT Acute Rehabilitation Services Pager [phone] Office [phone]   Chancy Milroy 09/21/2019, 2:45 PM

## 2019-09-21 NOTE — Progress Notes (Signed)
Physical Therapy Treatment Patient Details Name: Sarah Burns MRN: [id number] DOB: 06-03-1924 Today's Date: 09/21/2019    History of Present Illness Pt is a 86 y/o female admitted secondadry to acute cholecystitis. Pt is s/p laparascopic cholecystectomy. PMH includes HTN, asthma, and s/p pacemaker.     PT Comments    Pt making steady progress overall with mobility. She was able to progress to gait training this session. However, she continues to require some physical assistance with bed mobility and transfers. Spoke with pt's daughter via telephone conversation after session. She is interested in [place] and an aide as she feels her mother will not agree to going to SNF for therapy services.   Pt would continue to benefit from skilled physical therapy services at this time while admitted and after d/c to address the below listed limitations in order to improve overall safety and independence with functional mobility.    Follow Up Recommendations  SNF;Supervision/Assistance - 24 hour;Other (comment) (per daughter, pt likely to refuse, will need HHPT and aide)     Equipment Recommendations  None recommended by PT;Other (comment) (per daughter pt has a Probation officer already)    Recommendations for Other Services       Precautions / Restrictions Precautions Precautions: Fall Restrictions Weight Bearing Restrictions: No    Mobility  Bed Mobility Overal bed mobility: Needs Assistance Bed Mobility: Supine to Sit;Sit to Supine     Supine to sit: Mod assist Sit to supine: Min guard   General bed mobility comments: assistance needed for trunk elevation and use of bed pads to position pt's hips at EOB  Transfers Overall transfer level: Needs assistance Equipment used: Rolling walker (2 wheeled) Transfers: Sit to/from Stand Sit to Stand: Min assist         General transfer comment: min A to power into standing from EOB  Ambulation/Gait Ambulation/Gait assistance: Min guard Gait  Distance (Feet): 40 Feet Assistive device: Rolling walker (2 wheeled) Gait Pattern/deviations: Step-through pattern;Decreased stride length;Trunk flexed Gait velocity: Decreased   General Gait Details: pt with very slow, guarded and cautious gait with use of RW; mild instability noted but no overt LOB or need for physical assistance   Stairs             Wheelchair Mobility    Modified Rankin (Stroke Patients Only)       Balance Overall balance assessment: Needs assistance Sitting-balance support: No upper extremity supported;Feet supported Sitting balance-Leahy Scale: Fair     Standing balance support: Bilateral upper extremity supported Standing balance-Leahy Scale: Poor Standing balance comment: Reliant on UE support                             Cognition Arousal/Alertness: Awake/alert Behavior During Therapy: WFL for tasks assessed/performed Overall Cognitive Status: Within Functional Limits for tasks assessed                                 General Comments: Pt reported she had already walked around the bed 3x and did not want to get up again.      Exercises      General Comments General comments (skin integrity, edema, etc.): educated on importance of OOB activity to prevent further weakness, pt voiced understanding but declined       Pertinent Vitals/Pain Pain Assessment: Faces Faces Pain Scale: Hurts little more Pain Location: abdomen Pain Descriptors / Indicators:  Sore Pain Intervention(s): Monitored during session;Repositioned    Home Living Family/patient expects to be discharged to:: Private residence Living Arrangements: Children Available Help at Discharge: Family Type of Home: House Home Access: Stairs to enter Entrance Stairs-Rails: Right;Left;Can reach both Home Layout: One level Home Equipment: Environmental consultant - 2 wheels;Walker - 4 wheels;Cane - single point Additional Comments: Pt reports her daughter told her there  will be people coming to the house to help her    Prior Function Level of Independence: Independent      Comments: Pt does not drive or work   PT Goals (current goals can now be found in the care plan section) Acute Rehab PT Goals Patient Stated Goal: feel better and get home  PT Goal Formulation: With patient Time For Goal Achievement: 10/04/19 Potential to Achieve Goals: Good Progress towards PT goals: Progressing toward goals    Frequency    Min 3X/week      PT Plan Current plan remains appropriate    Co-evaluation              AM-PAC PT "6 Clicks" Mobility   Outcome Measure  Help needed turning from your back to your side while in a flat bed without using bedrails?: A Little Help needed moving from lying on your back to sitting on the side of a flat bed without using bedrails?: A Lot Help needed moving to and from a bed to a chair (including a wheelchair)?: A Little Help needed standing up from a chair using your arms (e.g., wheelchair or bedside chair)?: A Little Help needed to walk in hospital room?: A Little Help needed climbing 3-5 steps with a railing? : A Lot 6 Click Score: 16    End of Session   Activity Tolerance: Patient tolerated treatment well Patient left: in bed;with call bell/phone within reach;with bed alarm set Nurse Communication: Mobility status PT Visit Diagnosis: Other abnormalities of gait and mobility (R26.89)     Time: 5956-3875 PT Time Calculation (min) (ACUTE ONLY): 33 min  Charges:  $Gait Training: 8-22 mins $Therapeutic Activity: 8-22 mins                     Arletta Bale, DPT  Acute Rehabilitation Services Pager [phone] Office [phone]     Sarah Burns 09/21/2019, 3:58 PM

## 2019-09-21 NOTE — Progress Notes (Signed)
PROGRESS NOTE    Sarah Burns  HGD:[id number] DOB: [birth year] DOA: 09/17/2019 PCP: Lucianne Lei, MD   Brief Narrative:  Sarah Burns is an 86 y.o. female with a past medical history that includes hypertension, hyperlipidemia, asthma, pacemaker admitted6/86from home with a chief complaint abdominal pain/nausea and vomiting. Work-up in the emergency department included a CT scan that noted acute cholecystitis. Patient also has a 1 cm stone in the cystic duct. Lab work reveals hyponatremia leukocytosis.  She was admitted under hospital service.  Medical management was started with antibiotics. General surgery consulted.  Initial plan was to continue medical management as patient wanted to avoid surgery but eventually patient ended up having laparoscopic cholecystectomy on 09/19/2019.  Assessment & Plan:   Principal Problem:   Acute cholecystitis Active Problems:   Pressure injury of skin   Hyponatremia   Hypertension   Asthma   Allergies   Hyperlipidemia   Anemia   Acute cholecystitis: Status post laparoscopic cholecystectomy on 6/21.  She feels better than yesterday.  No more nausea.  Tolerating clears.  Seen by general surgery.  Advancing to full liquid diet.  Appreciate general surgery help.  Hyponatremia: Resolved.  Hypokalemia: 3.4.  Will replace with IV.  Recheck in the morning.  Essentialhypertension: Much better controlled.  Continue home dose of losartan and amlodipine.  Continue as needed hydralazine.  Mild intermittent asthma. Medications include inhalers. Stable at baseline  Anemia of chronic disease: Hemoglobin is stable.  Hyperlipidemia: Resume statin.  obesity Body mass index is 32.44 kg/m.  DVT prophylaxis: Lovenox   Code Status: Full Code  Family Communication:  None present at bedside.  Plan of care discussed with patient in length and he verbalized understanding and agreed with it. Patient is from: Home Disposition Plan: Home Barriers to  discharge: Intolerant to p.o.  Advancing diet slowly.  Status is: Inpatient  Remains inpatient appropriate because:IV treatments appropriate due to intensity of illness or inability to take PO   Dispo: The patient is from: Home              Anticipated d/c is to: Home with home health              Anticipated d/c date is: 1 day              Patient currently is not medically stable to d/c.        Estimated body mass index is 32.44 kg/m as calculated from the following:   Height as of this encounter: 4\' 3"  (1.295 m).   Weight as of this encounter: 54.4 kg.  Pressure Injury 09/17/19 Buttocks Mid Stage 2 -  Partial thickness loss of dermis presenting as a shallow open injury with a red, pink wound bed without slough. (Active)  09/17/19 2247  Location: Buttocks  Location Orientation: Mid  Staging: Stage 2 -  Partial thickness loss of dermis presenting as a shallow open injury with a red, pink wound bed without slough.  Wound Description (Comments):   Present on Admission: Yes     Nutritional status:               Consultants:   General surgery  Procedures:   Laparoscopic cholecystectomy 09/19/2019  Antimicrobials:  Anti-infectives (From admission, onward)   Start     Dose/Rate Route Frequency Ordered Stop   09/18/19 0700  cefTRIAXone (ROCEPHIN) 2 g in sodium chloride 0.9 % 100 mL IVPB     Discontinue     2 g 200 mL/hr over  30 Minutes Intravenous Every 24 hours 09/17/19 1827     09/17/19 0645  cefTRIAXone (ROCEPHIN) 2 g in sodium chloride 0.9 % 100 mL IVPB        2 g 200 mL/hr over 30 Minutes Intravenous  Once 09/17/19 0644 09/17/19 0757         Subjective: Seen and examined.  Feels better than yesterday.  Still with abdominal pain but improving.  No more nausea and tolerating clears but little afraid of advancing diet.  Objective: Vitals:   09/21/19 0012 09/21/19 0547 09/21/19 0815 09/21/19 0901  BP: (!) 163/52 (!) 176/58  (!) 152/40  Pulse: 95 89     Resp: 18 16    Temp: (!) 97.5 F (36.4 C) 99 F (37.2 C)    TempSrc: Oral Oral    SpO2: 95% 93% 97%   Weight:      Height:        Intake/Output Summary (Last 24 hours) at 09/21/2019 1100 Last data filed at 09/21/2019 0600 Gross per 24 hour  Intake 1983.37 ml  Output --  Net 1983.37 ml   Filed Weights   09/17/19 0304 09/19/19 1330  Weight: 54.4 kg 54.4 kg    Examination:  General exam: Appears calm and comfortable  Respiratory system: Clear to auscultation. Respiratory effort normal. Cardiovascular system: S1 & S2 heard, RRR. No JVD, murmurs, rubs, gallops or clicks. No pedal edema. Gastrointestinal system: Abdomen is nondistended, soft and generalized tenderness. No organomegaly or masses felt.  Reduced bowel sounds. Central nervous system: Alert and oriented. No focal neurological deficits. Extremities: Symmetric 5 x 5 power. Skin: No rashes, lesions or ulcers.  Psychiatry: Judgement and insight appear normal. Mood & affect flat.   Data Reviewed: I have personally reviewed following labs and imaging studies  CBC: Recent Labs  Lab 09/17/19 0346 09/18/19 0751 09/19/19 0716 09/20/19 0550 09/21/19 0630  WBC 21.8* 20.4* 14.7* 16.7* 22.5*  NEUTROABS 20.0*  --   --   --  19.4*  HGB 11.6* 9.7* 9.4* 11.1* 9.3*  HCT 34.3* 29.0* 28.3* 32.9* 28.2*  MCV 91.0 92.1 92.5 91.4 91.9  PLT 295 230 229 310 290   Basic Metabolic Panel: Recent Labs  Lab 09/17/19 1543 09/18/19 0751 09/19/19 0716 09/20/19 0550 09/21/19 0630  NA 127* 128* 130* 135 134*  K 4.5 3.8 3.3* 4.5 3.4*  CL 94* 98 101 101 101  CO2 22 21* 21* 20* 19*  GLUCOSE 112* 123* 118* 168* 146*  BUN 20 16 9 9 8   CREATININE 1.13* 1.01* 0.86 0.81 0.79  CALCIUM 8.7* 8.4* 8.1* 8.8* 8.5*  MG  --   --   --   --  1.9   GFR: Estimated Creatinine Clearance: 25.4 mL/min (by C-G formula based on SCr of 0.79 mg/dL). Liver Function Tests: Recent Labs  Lab 09/17/19 0346 09/18/19 0751 09/21/19 0630  AST 24 18 28   ALT  19 16 39  ALKPHOS 64 65 82  BILITOT 0.8 0.9 0.5  PROT 6.8 5.8* 5.5*  ALBUMIN 3.5 2.7* 2.1*   Recent Labs  Lab 09/17/19 0346  LIPASE 27   No results for input(s): AMMONIA in the last 168 hours. Coagulation Profile: No results for input(s): INR, PROTIME in the last 168 hours. Cardiac Enzymes: No results for input(s): CKTOTAL, CKMB, CKMBINDEX, TROPONINI in the last 168 hours. BNP (last 3 results) No results for input(s): PROBNP in the last 8760 hours. HbA1C: No results for input(s): HGBA1C in the last 72 hours. CBG: No results  for input(s): GLUCAP in the last 168 hours. Lipid Profile: No results for input(s): CHOL, HDL, LDLCALC, TRIG, CHOLHDL, LDLDIRECT in the last 72 hours. Thyroid Function Tests: No results for input(s): TSH, T4TOTAL, FREET4, T3FREE, THYROIDAB in the last 72 hours. Anemia Panel: No results for input(s): VITAMINB12, FOLATE, FERRITIN, TIBC, IRON, RETICCTPCT in the last 72 hours. Sepsis Labs: No results for input(s): PROCALCITON, LATICACIDVEN in the last 168 hours.  Recent Results (from the past 240 hour(s))  SARS Coronavirus 2 by RT PCR (hospital order, performed in [facility] hospital lab) Nasopharyngeal Nasopharyngeal Swab     Status: None   Collection Time: 09/17/19  7:28 AM   Specimen: Nasopharyngeal Swab  Result Value Ref Range Status   SARS Coronavirus 2 NEGATIVE NEGATIVE Final    Comment: (NOTE) SARS-CoV-2 target nucleic acids are NOT DETECTED. The SARS-CoV-2 RNA is generally detectable in upper and lower respiratory specimens during the acute phase of infection. The lowest concentration of SARS-CoV-2 viral copies this assay can detect is 250 copies / mL. A negative result does not preclude SARS-CoV-2 infection and should not be used as the sole basis for treatment or other patient management decisions.  A negative result may occur with improper specimen collection / handling, submission of specimen other than nasopharyngeal swab, presence of viral  mutation(s) within the areas targeted by this assay, and inadequate number of viral copies (<250 copies / mL). A negative result must be combined with clinical observations, patient history, and epidemiological information. Fact Sheet for Patients:   BoilerBrush.com.cy Fact Sheet for Healthcare Providers: https://pope.com/ This test is not yet approved or cleared  by the [place] FDA and has been authorized for detection and/or diagnosis of SARS-CoV-2 by FDA under an Emergency Use Authorization (EUA).  This EUA will remain in effect (meaning this test can be used) for the duration of the COVID-19 declaration under Section 564(b)(1) of the Act, 21 U.S.C. section 360bbb-3(b)(1), unless the authorization is terminated or revoked sooner. Performed at [facility], [address], [place], [place] [address]   Culture, Urine     Status: None   Collection Time: 09/18/19 10:31 AM   Specimen: Urine, Clean Catch  Result Value Ref Range Status   Specimen Description URINE, CLEAN CATCH  Final   Special Requests NONE  Final   Culture   Final    NO GROWTH Performed at [facility], [address], [place], [place] [address]    Report Status 09/19/2019 FINAL  Final  Surgical pcr screen     Status: None   Collection Time: 09/19/19  6:25 AM   Specimen: Nasal Mucosa; Nasal Swab  Result Value Ref Range Status   MRSA, PCR NEGATIVE NEGATIVE Final   Staphylococcus aureus NEGATIVE NEGATIVE Final    Comment: (NOTE) The Xpert SA Assay (FDA approved for NASAL specimens in patients 72 years of age and older), is one component of a comprehensive surveillance program. It is not intended to diagnose infection nor to guide or monitor treatment. Performed at [facility], [address] [place], [place] [address]       Radiology Studies: No results found.  Scheduled Meds: . amLODipine  10 mg Oral Daily  . atorvastatin  80 mg  Oral q1800  . docusate sodium  100 mg Oral BID  . feeding supplement  1 Container Oral BID BM  . losartan  50 mg Oral Daily  . mometasone-formoterol  2 puff Inhalation BID  . potassium chloride  40 mEq Oral BID   Continuous Infusions: . sodium chloride 75 mL/hr at 09/21/19 0600  . cefTRIAXone (ROCEPHIN)  IV 2 g (09/21/19 0617)     LOS: 4 days   Time spent: 29 minutes   Hughie Closs, MD [facility]  09/21/2019, 11:00 AM   To contact the attending provider between 7A-7P or the covering provider during after hours 7P-7A, please log into the web site [email or web address].

## 2019-09-21 NOTE — TOC Progression Note (Addendum)
Transition of Care [facility]) - Progression Note    Patient Details  Name: Sarah Burns MRN: [id number] Date of Birth: 1924-05-07  Transition of Care [facility]) CM/SW Contact  Doy Hut[place], [place] Phone Number: 09/21/2019, 3:42 PM  Clinical Narrative:    4:16pm- CSW spoke with Sarah Burns, pt dauhgter. She and pt will review CMS list this evening, she knows to pick a top two choice in order to make sure the agency they pick can staff case.   3:42pm- HIPAA compliant message left for pt daughter. [facility] CMS list left for pt daughter at front desk. Await return call, continued medical work up.    Expected Discharge Plan: Home w Home Health Services Barriers to Discharge: Continued Medical Work up  Expected Discharge Plan and Services Expected Discharge Plan: Home w Home Health Services In-house Referral: Clinical Social Work Discharge Planning Services: CM Consult Post Acute Care Choice: Durable Medical Equipment, Home Health Living arrangements for the past 2 months: Single Family Home                      Readmission Risk Interventions Readmission Risk Prevention Plan 09/20/2019  Transportation Screening Complete  PCP or Specialist Appt within 5-7 Days Complete  Home Care Screening Complete  Medication Review (RN CM) Referral to Pharmacy

## 2019-09-21 NOTE — Progress Notes (Addendum)
[facility] Progress Note  2 Days Post-Op  Subjective: CC-  Sitting up in bed sipping on clear liquids. Abdomen sore. She did vomit once yesterday but nausea is improved today. No flatus or BM.  WBC up 22.5, TMAX 99. States that she does have a mild cough, but she has asthma and this is not abnormal for her. Denies dysuria or any other new complaints.  Objective: Vital signs in last 24 hours: Temp:  [97.5 F (36.4 C)-99 F (37.2 C)] 99 F (37.2 C) (06/10 0547) Pulse Rate:  [89-95] 89 (06/10 0547) Resp:  [16-18] 16 (06/10 0547) BP: (161-183)/(52-58) 176/58 (06/10 0547) SpO2:  [93 %-97 %] 97 % (06/10 0815)    Intake/Output from previous day: 06/09 0701 - 06/10 0700 In: 1983.4 [I.V.:1983.4] Out: -  Intake/Output this shift: No intake/output data recorded.  PE: Gen:  Alert, NAD, pleasant Pulm:  rate and effort normal Abd: Soft, mild distension, minimal tenderness, few BS heard, lap incisions C/D/I Skin: warm and dry   Lab Results:  Recent Labs    09/20/19 0550 09/21/19 0630  WBC 16.7* 22.5*  HGB 11.1* 9.3*  HCT 32.9* 28.2*  PLT 310 290   BMET Recent Labs    09/20/19 0550 09/21/19 0630  NA 135 134*  K 4.5 3.4*  CL 101 101  CO2 20* 19*  GLUCOSE 168* 146*  BUN 9 8  CREATININE 0.81 0.79  CALCIUM 8.8* 8.5*   PT/INR No results for input(s): LABPROT, INR in the last 72 hours. CMP     Component Value Date/Time   NA 134 (L) 09/21/2019 0630   K 3.4 (L) 09/21/2019 0630   CL 101 09/21/2019 0630   CO2 19 (L) 09/21/2019 0630   GLUCOSE 146 (H) 09/21/2019 0630   BUN 8 09/21/2019 0630   CREATININE 0.79 09/21/2019 0630   CALCIUM 8.5 (L) 09/21/2019 0630   PROT 5.5 (L) 09/21/2019 0630   ALBUMIN 2.1 (L) 09/21/2019 0630   AST 28 09/21/2019 0630   ALT 39 09/21/2019 0630   ALKPHOS 82 09/21/2019 0630   BILITOT 0.5 09/21/2019 0630   GFRNONAA >60 09/21/2019 0630   GFRAA >60 09/21/2019 0630   Lipase     Component Value Date/Time   LIPASE 27 09/17/2019  0346       Studies/Results: No results found.  Anti-infectives: Anti-infectives (From admission, onward)   Start     Dose/Rate Route Frequency Ordered Stop   09/18/19 0700  cefTRIAXone (ROCEPHIN) 2 g in sodium chloride 0.9 % 100 mL IVPB     Discontinue     2 g 200 mL/hr over 30 Minutes Intravenous Every 24 hours 09/17/19 1827     09/17/19 0645  cefTRIAXone (ROCEPHIN) 2 g in sodium chloride 0.9 % 100 mL IVPB        2 g 200 mL/hr over 30 Minutes Intravenous  Once 09/17/19 0644 09/17/19 0757       Assessment/Plan HTN HLD Asthma S/p pacemaker Hyponatremia Anemia   ACUTE CHOLECYSTITIS S/p LAPAROSCOPIC CHOLECYSTECTOMY 6/8 Dr. Derrell Lolling - POD#2 - needs 10 total days of antibiotics  ID -rocephin6/6>>day#4/10 VTE -SCDs, hold lovenox due to anemia FEN -IVF, FLD, Boost breeze Foley -none Follow up -[facility] clinic   Plan - Dulcolax suppository today. Advance to full liquids and advance diet as tolerated. Replace K and check magnesium level. Continue PT, mobilize. Continue antibiotics as above. Hold lovenox with drop in hemoglobin.    LOS: 4 days    Franne Forts, PA-C [facility] 09/21/2019, 8:39 AM Please see Amion for pager number during day hours 7:00am-4:30pm

## 2019-09-22 LAB — CBC WITH DIFFERENTIAL/PLATELET
Abs Immature Granulocytes: 1.13 10*3/uL — ABNORMAL HIGH (ref 0.00–0.07)
Basophils Absolute: 0.1 10*3/uL (ref 0.0–0.1)
Basophils Relative: 0 %
Eosinophils Absolute: 0 10*3/uL (ref 0.0–0.5)
Eosinophils Relative: 0 %
HCT: 28.5 % — ABNORMAL LOW (ref 36.0–46.0)
Hemoglobin: 9.5 g/dL — ABNORMAL LOW (ref 12.0–15.0)
Immature Granulocytes: 5 %
Lymphocytes Relative: 4 %
Lymphs Abs: 1 10*3/uL (ref 0.7–4.0)
MCH: 30.5 pg (ref 26.0–34.0)
MCHC: 33.3 g/dL (ref 30.0–36.0)
MCV: 91.6 fL (ref 80.0–100.0)
Monocytes Absolute: 1.1 10*3/uL — ABNORMAL HIGH (ref 0.1–1.0)
Monocytes Relative: 5 %
Neutro Abs: 18.9 10*3/uL — ABNORMAL HIGH (ref 1.7–7.7)
Neutrophils Relative %: 86 %
Platelets: 328 10*3/uL (ref 150–400)
RBC: 3.11 MIL/uL — ABNORMAL LOW (ref 3.87–5.11)
RDW: 13.5 % (ref 11.5–15.5)
WBC: 22.1 10*3/uL — ABNORMAL HIGH (ref 4.0–10.5)
nRBC: 0 % (ref 0.0–0.2)

## 2019-09-22 LAB — COMPREHENSIVE METABOLIC PANEL
ALT: 29 U/L (ref 0–44)
AST: 21 U/L (ref 15–41)
Albumin: 2.1 g/dL — ABNORMAL LOW (ref 3.5–5.0)
Alkaline Phosphatase: 103 U/L (ref 38–126)
Anion gap: 8 (ref 5–15)
BUN: 8 mg/dL (ref 8–23)
CO2: 20 mmol/L — ABNORMAL LOW (ref 22–32)
Calcium: 8.6 mg/dL — ABNORMAL LOW (ref 8.9–10.3)
Chloride: 101 mmol/L (ref 98–111)
Creatinine, Ser: 0.75 mg/dL (ref 0.44–1.00)
GFR calc Af Amer: >60 mL/min (ref >60)
GFR calc non Af Amer: >60 mL/min (ref >60)
Glucose, Bld: 143 mg/dL — ABNORMAL HIGH (ref 70–99)
Potassium: 4.2 mmol/L (ref 3.5–5.1)
Sodium: 129 mmol/L — ABNORMAL LOW (ref 135–145)
Total Bilirubin: 0.6 mg/dL (ref 0.3–1.2)
Total Protein: 5.6 g/dL — ABNORMAL LOW (ref 6.5–8.1)

## 2019-09-22 MED ORDER — POLYETHYLENE GLYCOL 3350 17 G PO PACK
17.0000 g | PACK | Freq: Every day | ORAL | Status: DC
  Administered 2019-09-22 – 2019-09-23 (×2): 17 g via ORAL
  Filled 2019-09-22 (×2): qty 1

## 2019-09-22 MED ORDER — BISACODYL 10 MG RE SUPP: 10.0000 mg | Freq: Every day | RECTAL | Status: DC | PRN

## 2019-09-22 NOTE — TOC Progression Note (Signed)
Transition of Care [facility]) - Progression Note    Patient Details  Name: Sarah Burns MRN: [id number] Date of Birth: 1925/02/05  [facility] CM/SW Contact  Doy Hutching, [place] Phone Number: 09/22/2019, 2:51 PM  Clinical Narrative:    CSW followed up with pt daughter Gavin Pound via telephone. She is aware we are waiting on pt diet to advance but that physician thinks she could be ready for discharge as early as tomorrow. Pt daughter still hasnt decided yet about preferred provider.   She is inquiring about PCS at home. Discussed that without Medicaid PCS would be an additional cost. Pt daughter states understanding. Will review the list and follow up with this writer about preference/any additional questions. Encouraged pt daughter to please have a choice tomorrow.   Of note- pt will have [employer] Medicare at beginning of July.    Expected Discharge Plan: Home w Home Health Services Barriers to Discharge: Continued Medical Work up  Expected Discharge Plan and Services Expected Discharge Plan: Home w Home Health Services In-house Referral: Clinical Social Work Discharge Planning Services: CM Consult Post Acute Care Choice: Durable Medical Equipment, Home Health Living arrangements for the past 2 months: Single Family Home                 Readmission Risk Interventions Readmission Risk Prevention Plan 09/20/2019  Transportation Screening Complete  PCP or Specialist Appt within 5-7 Days Complete  Home Care Screening Complete  Medication Review (RN CM) Referral to Pharmacy

## 2019-09-22 NOTE — Progress Notes (Signed)
PROGRESS NOTE    Sarah Burns  EYC:[id number] DOB: [birth year] DOA: 09/17/2019 PCP: Renaye Rakers, MD   Brief Narrative:  Sarah Burns is an 86 y.o. female with a past medical history that includes hypertension, hyperlipidemia, asthma, pacemaker admitted6/33from home with a chief complaint abdominal pain/nausea and vomiting. Work-up in the emergency department included a CT scan that noted acute cholecystitis. Patient also has a 1 cm stone in the cystic duct. Lab work reveals hyponatremia leukocytosis.  She was admitted under hospital service.  Medical management was started with antibiotics. General surgery consulted.  Initial plan was to continue medical management as patient wanted to avoid surgery but eventually patient ended up having laparoscopic cholecystectomy on 09/19/2019.  Assessment & Plan:   Principal Problem:   Acute cholecystitis Active Problems:   Pressure injury of skin   Hyponatremia   Hypertension   Asthma   Allergies   Hyperlipidemia   Anemia   Acute cholecystitis: Status post laparoscopic cholecystectomy on 6/21.  She said that she is slightly better than yesterday but is still complained of some nausea but no vomiting.  She is on full liquid diet.  General surgery has advanced to soft diet.  They have cleared her for discharge today however based on her lethargy and nausea, I do not think she is ready for discharge today.  We will continue her on soft diet and watch for another day.    Hyponatremia: Dropped to 129 today.  Continue IV fluids.  Repeat sodium later today and tomorrow morning.  Hypokalemia: 3.4.  Resolved.  Essentialhypertension: Much better controlled.  Continue home dose of losartan and amlodipine.  Continue as needed hydralazine.  Mild intermittent asthma. Medications include inhalers. Stable at baseline  Anemia of chronic disease: Hemoglobin is stable.  Hyperlipidemia: Resume statin.  obesity Body mass index is 32.44 kg/m.  DVT  prophylaxis: Lovenox   Code Status: Full Code  Family Communication:  None present at bedside.  Plan of care discussed with patient in length and he verbalized understanding and agreed with it. Patient is from: Home Disposition Plan: Home Barriers to discharge: Intolerant to p.o.  Advancing diet slowly.  Status is: Inpatient  Remains inpatient appropriate because:IV treatments appropriate due to intensity of illness or inability to take PO   Dispo: The patient is from: Home              Anticipated d/c is to: Home with home health              Anticipated d/c date is: 1 day              Patient currently is not medically stable to d/c.        Estimated body mass index is 32.44 kg/m as calculated from the following:   Height as of this encounter: 4\' 3"  (1.295 m).   Weight as of this encounter: 54.4 kg.  Pressure Injury 09/17/19 Buttocks Mid Stage 2 -  Partial thickness loss of dermis presenting as a shallow open injury with a red, pink wound bed without slough. (Active)  09/17/19 2247  Location: Buttocks  Location Orientation: Mid  Staging: Stage 2 -  Partial thickness loss of dermis presenting as a shallow open injury with a red, pink wound bed without slough.  Wound Description (Comments):   Present on Admission: Yes     Nutritional status:               Consultants:   General surgery  Procedures:  Laparoscopic cholecystectomy 09/19/2019  Antimicrobials:  Anti-infectives (From admission, onward)   Start     Dose/Rate Route Frequency Ordered Stop   09/18/19 0700  cefTRIAXone (ROCEPHIN) 2 g in sodium chloride 0.9 % 100 mL IVPB     Discontinue     2 g 200 mL/hr over 30 Minutes Intravenous Every 24 hours 09/17/19 1827     09/17/19 0645  cefTRIAXone (ROCEPHIN) 2 g in sodium chloride 0.9 % 100 mL IVPB        2 g 200 mL/hr over 30 Minutes Intravenous  Once 09/17/19 0644 09/17/19 0757         Subjective: Seen and examined.  Although she also states  that she is feeling better than yesterday but also has nausea but no vomiting.  Tolerating full liquid diet by advancing diet slowly.  Objective: Vitals:   09/21/19 2053 09/22/19 0005 09/22/19 0615 09/22/19 0825  BP:  (!) 148/50 (!) 164/50   Pulse:  88 86   Resp:  16 17   Temp:  97.6 F (36.4 C) 98.4 F (36.9 C)   TempSrc:  Oral Oral   SpO2: 100% 96% 95% 99%  Weight:      Height:        Intake/Output Summary (Last 24 hours) at 09/22/2019 1034 Last data filed at 09/22/2019 0700 Gross per 24 hour  Intake 3365.02 ml  Output 800 ml  Net 2565.02 ml   Filed Weights   09/17/19 0304 09/19/19 1330  Weight: 54.4 kg 54.4 kg    Examination:  General exam: Appears calm and comfortable but slightly lethargic Respiratory system: Clear to auscultation. Respiratory effort normal. Cardiovascular system: S1 & S2 heard, RRR. No JVD, murmurs, rubs, gallops or clicks. No pedal edema. Gastrointestinal system: Abdomen is nondistended, soft and generalized tender. No organomegaly or masses felt. Normal bowel sounds heard. Central nervous system: Slightly lethargic and oriented. No focal neurological deficits. Extremities: Symmetric 5 x 5 power. Skin: No rashes, lesions or ulcers.  Psychiatry: Judgement and insight appear normal. Mood & affect appropriate.    Data Reviewed: I have personally reviewed following labs and imaging studies  CBC: Recent Labs  Lab 09/17/19 0346 09/17/19 0346 09/18/19 0751 09/19/19 0716 09/20/19 0550 09/21/19 0630 09/22/19 0744  WBC 21.8*   < > 20.4* 14.7* 16.7* 22.5* 22.1*  NEUTROABS 20.0*  --   --   --   --  19.4* 18.9*  HGB 11.6*   < > 9.7* 9.4* 11.1* 9.3* 9.5*  HCT 34.3*   < > 29.0* 28.3* 32.9* 28.2* 28.5*  MCV 91.0   < > 92.1 92.5 91.4 91.9 91.6  PLT 295   < > 230 229 310 290 328   < > = values in this interval not displayed.   Basic Metabolic Panel: Recent Labs  Lab 09/18/19 0751 09/19/19 0716 09/20/19 0550 09/21/19 0630 09/22/19 0744  NA 128*  130* 135 134* 129*  K 3.8 3.3* 4.5 3.4* 4.2  CL 98 101 101 101 101  CO2 21* 21* 20* 19* 20*  GLUCOSE 123* 118* 168* 146* 143*  BUN 16 9 9 8 8   CREATININE 1.01* 0.86 0.81 0.79 0.75  CALCIUM 8.4* 8.1* 8.8* 8.5* 8.6*  MG  --   --   --  1.9  --    GFR: Estimated Creatinine Clearance: 25.4 mL/min (by C-G formula based on SCr of 0.75 mg/dL). Liver Function Tests: Recent Labs  Lab 09/17/19 0346 09/18/19 0751 09/21/19 0630 09/22/19 0744  AST 24 18  28 21  ALT 19 16 39 29  ALKPHOS 64 65 82 103  BILITOT 0.8 0.9 0.5 0.6  PROT 6.8 5.8* 5.5* 5.6*  ALBUMIN 3.5 2.7* 2.1* 2.1*   Recent Labs  Lab 09/17/19 0346  LIPASE 27   No results for input(s): AMMONIA in the last 168 hours. Coagulation Profile: No results for input(s): INR, PROTIME in the last 168 hours. Cardiac Enzymes: No results for input(s): CKTOTAL, CKMB, CKMBINDEX, TROPONINI in the last 168 hours. BNP (last 3 results) No results for input(s): PROBNP in the last 8760 hours. HbA1C: No results for input(s): HGBA1C in the last 72 hours. CBG: No results for input(s): GLUCAP in the last 168 hours. Lipid Profile: No results for input(s): CHOL, HDL, LDLCALC, TRIG, CHOLHDL, LDLDIRECT in the last 72 hours. Thyroid Function Tests: No results for input(s): TSH, T4TOTAL, FREET4, T3FREE, THYROIDAB in the last 72 hours. Anemia Panel: No results for input(s): VITAMINB12, FOLATE, FERRITIN, TIBC, IRON, RETICCTPCT in the last 72 hours. Sepsis Labs: No results for input(s): PROCALCITON, LATICACIDVEN in the last 168 hours.  Recent Results (from the past 240 hour(s))  SARS Coronavirus 2 by RT PCR (hospital order, performed in [facility] hospital lab) Nasopharyngeal Nasopharyngeal Swab     Status: None   Collection Time: 09/17/19  7:28 AM   Specimen: Nasopharyngeal Swab  Result Value Ref Range Status   SARS Coronavirus 2 NEGATIVE NEGATIVE Final    Comment: (NOTE) SARS-CoV-2 target nucleic acids are NOT DETECTED. The SARS-CoV-2 RNA is  generally detectable in upper and lower respiratory specimens during the acute phase of infection. The lowest concentration of SARS-CoV-2 viral copies this assay can detect is 250 copies / mL. A negative result does not preclude SARS-CoV-2 infection and should not be used as the sole basis for treatment or other patient management decisions.  A negative result may occur with improper specimen collection / handling, submission of specimen other than nasopharyngeal swab, presence of viral mutation(s) within the areas targeted by this assay, and inadequate number of viral copies (<250 copies / mL). A negative result must be combined with clinical observations, patient history, and epidemiological information. Fact Sheet for Patients:   StrictlyIdeas.no Fact Sheet for Healthcare Providers: BankingDealers.co.za This test is not yet approved or cleared  by the [place] FDA and has been authorized for detection and/or diagnosis of SARS-CoV-2 by FDA under an Emergency Use Authorization (EUA).  This EUA will remain in effect (meaning this test can be used) for the duration of the COVID-19 declaration under Section 564(b)(1) of the Act, 21 U.S.C. section 360bbb-3(b)(1), unless the authorization is terminated or revoked sooner. Performed at [facility], [place] [address], [place], [place] [address]   Culture, Urine     Status: None   Collection Time: 09/18/19 10:31 AM   Specimen: Urine, Clean Catch  Result Value Ref Range Status   Specimen Description URINE, CLEAN CATCH  Final   Special Requests NONE  Final   Culture   Final    NO GROWTH Performed at [facility], [place] [address], [place], [place] [address]    Report Status 09/19/2019 FINAL  Final  Surgical pcr screen     Status: None   Collection Time: 09/19/19  6:25 AM   Specimen: Nasal Mucosa; Nasal Swab  Result Value Ref Range Status   MRSA, PCR NEGATIVE NEGATIVE Final    Staphylococcus aureus NEGATIVE NEGATIVE Final    Comment: (NOTE) The Xpert SA Assay (FDA approved for NASAL  specimens in patients 1 years of age and older), is one component of a comprehensive surveillance program. It is not intended to diagnose infection nor to guide or monitor treatment. Performed at [facility], [address], [place], [place] [address]       Radiology Studies: No results found.  Scheduled Meds: . amLODipine  10 mg Oral Daily  . atorvastatin  80 mg Oral q1800  . docusate sodium  100 mg Oral BID  . feeding supplement  1 Container Oral BID BM  . losartan  50 mg Oral Daily  . mometasone-formoterol  2 puff Inhalation BID  . polyethylene glycol  17 g Oral Daily   Continuous Infusions: . sodium chloride 75 mL/hr at 09/22/19 0427  . cefTRIAXone (ROCEPHIN)  IV 2 g (09/22/19 0602)     LOS: 5 days   Time spent: 28 minutes   Hughie Closs, MD [facility]  09/22/2019, 10:34 AM   To contact the attending provider between 7A-7P or the covering provider during after hours 7P-7A, please log into the web site [email or web address].

## 2019-09-22 NOTE — Progress Notes (Signed)
Physical Therapy Treatment Patient Details Name: Sarah Burns MRN: [id number] DOB: Nov 22, 1924 Today's Date: 09/22/2019    History of Present Illness Pt is a 86 y/o female admitted secondadry to acute cholecystitis. Pt is s/p laparascopic cholecystectomy. PMH includes HTN, asthma, and s/p pacemaker.     PT Comments    Pt making slow, steady progress overall with mobility. She continues to be limited overall secondary to abdominal pain, weakness and fatigue. Pt required greatly increased time and effort for all mobility. She tolerates upright standing position the most. Finished session with pt seated upright in recliner chair. She continues to report nausea and unable to tolerate much as far as soft foods (bowl of grits on her tray this morning). Pt would continue to benefit from skilled physical therapy services at this time while admitted and after d/c to address the below listed limitations in order to improve overall safety and independence with functional mobility.   Follow Up Recommendations  SNF;Supervision/Assistance - 24 hour;Other (comment) (per daughter, pt likely to refuse, will need HHPT and aide)     Equipment Recommendations  None recommended by PT    Recommendations for Other Services       Precautions / Restrictions Precautions Precautions: Fall Restrictions Weight Bearing Restrictions: No    Mobility  Bed Mobility               General bed mobility comments: pt OOB in recliner chair upon arrival  Transfers Overall transfer level: Needs assistance Equipment used: Rolling walker (2 wheeled) Transfers: Sit to/from Stand Sit to Stand: Min assist         General transfer comment: min A to power into standing from chair  Ambulation/Gait Ambulation/Gait assistance: Min guard Gait Distance (Feet): 50 Feet Assistive device: Rolling walker (2 wheeled) Gait Pattern/deviations: Step-through pattern;Decreased stride length;Trunk flexed Gait velocity:  Decreased   General Gait Details: pt with very slow, guarded and cautious gait with use of RW; mild instability noted but no overt LOB or need for physical assistance   Stairs             Wheelchair Mobility    Modified Rankin (Stroke Patients Only)       Balance Overall balance assessment: Needs assistance Sitting-balance support: No upper extremity supported;Feet supported Sitting balance-Leahy Scale: Fair     Standing balance support: Bilateral upper extremity supported Standing balance-Leahy Scale: Poor Standing balance comment: Reliant on UE support                             Cognition Arousal/Alertness: Awake/alert Behavior During Therapy: WFL for tasks assessed/performed Overall Cognitive Status: Within Functional Limits for tasks assessed                                        Exercises      General Comments        Pertinent Vitals/Pain Pain Assessment: Faces Faces Pain Scale: Hurts little more Pain Location: abdomen Pain Descriptors / Indicators: Sore Pain Intervention(s): Monitored during session;Repositioned    Home Living                      Prior Function            PT Goals (current goals can now be found in the care plan section) Acute Rehab PT Goals PT Goal Formulation:  With patient Time For Goal Achievement: 10/04/19 Potential to Achieve Goals: Good Progress towards PT goals: Progressing toward goals    Frequency    Min 3X/week      PT Plan Current plan remains appropriate    Co-evaluation              AM-PAC PT "6 Clicks" Mobility   Outcome Measure  Help needed turning from your back to your side while in a flat bed without using bedrails?: A Little Help needed moving from lying on your back to sitting on the side of a flat bed without using bedrails?: A Lot Help needed moving to and from a bed to a chair (including a wheelchair)?: A Little Help needed standing up from a  chair using your arms (e.g., wheelchair or bedside chair)?: A Little Help needed to walk in hospital room?: A Little Help needed climbing 3-5 steps with a railing? : A Lot 6 Click Score: 16    End of Session   Activity Tolerance: Patient tolerated treatment well Patient left: in chair;with call bell/phone within reach Nurse Communication: Mobility status PT Visit Diagnosis: Other abnormalities of gait and mobility (R26.89) Pain - part of body:  (abdomen)     Time: 9357-0177 PT Time Calculation (min) (ACUTE ONLY): 28 min  Charges:  $Gait Training: 8-22 mins $Therapeutic Activity: 8-22 mins                     Arletta Bale, DPT  Acute Rehabilitation Services Pager [phone] Office [phone]     Alessandra Bevels Hoa Deriso 09/22/2019, 10:11 AM

## 2019-09-22 NOTE — Progress Notes (Signed)
[facility] Progress Note  3 Days Post-Op  Subjective: CC-  Up in chair. Feeling better than yesterday, but still a little nauseated. No recent emesis. Drank milk and Boost for dinner, doesn't really want any breakfast. Started passing some flatus, small BM with suppository yesterday.  Objective: Vital signs in last 24 hours: Temp:  [97.4 F (36.3 C)-98.4 F (36.9 C)] 98.4 F (36.9 C) (06/11 0615) Pulse Rate:  [86-94] 86 (06/11 0615) Resp:  [16-17] 17 (06/11 0615) BP: (148-169)/(40-55) 164/50 (06/11 0615) SpO2:  [95 %-100 %] 95 % (06/11 0615)    Intake/Output from previous day: 06/10 0701 - 06/11 0700 In: 3365 [P.O.:480; I.V.:2140.2; IV Piggyback:744.8] Out: 800 [Urine:800] Intake/Output this shift: No intake/output data recorded.  PE: Gen: Alert, NAD, pleasant Pulm: rate and effort normal Abd: Soft,mild distension,mild RUQ TTP, +BS,lapincisions C/D/I Skin: warm and dry    Lab Results:  Recent Labs    09/20/19 0550 09/21/19 0630  WBC 16.7* 22.5*  HGB 11.1* 9.3*  HCT 32.9* 28.2*  PLT 310 290   BMET Recent Labs    09/20/19 0550 09/21/19 0630  NA 135 134*  K 4.5 3.4*  CL 101 101  CO2 20* 19*  GLUCOSE 168* 146*  BUN 9 8  CREATININE 0.81 0.79  CALCIUM 8.8* 8.5*   PT/INR No results for input(s): LABPROT, INR in the last 72 hours. CMP     Component Value Date/Time   NA 134 (L) 09/21/2019 0630   K 3.4 (L) 09/21/2019 0630   CL 101 09/21/2019 0630   CO2 19 (L) 09/21/2019 0630   GLUCOSE 146 (H) 09/21/2019 0630   BUN 8 09/21/2019 0630   CREATININE 0.79 09/21/2019 0630   CALCIUM 8.5 (L) 09/21/2019 0630   PROT 5.5 (L) 09/21/2019 0630   ALBUMIN 2.1 (L) 09/21/2019 0630   AST 28 09/21/2019 0630   ALT 39 09/21/2019 0630   ALKPHOS 82 09/21/2019 0630   BILITOT 0.5 09/21/2019 0630   GFRNONAA >60 09/21/2019 0630   GFRAA >60 09/21/2019 0630   Lipase     Component Value Date/Time   LIPASE 27 09/17/2019 0346       Studies/Results: No  results found.  Anti-infectives: Anti-infectives (From admission, onward)   Start     Dose/Rate Route Frequency Ordered Stop   09/18/19 0700  cefTRIAXone (ROCEPHIN) 2 g in sodium chloride 0.9 % 100 mL IVPB     Discontinue     2 g 200 mL/hr over 30 Minutes Intravenous Every 24 hours 09/17/19 1827     09/17/19 0645  cefTRIAXone (ROCEPHIN) 2 g in sodium chloride 0.9 % 100 mL IVPB        2 g 200 mL/hr over 30 Minutes Intravenous  Once 09/17/19 0644 09/17/19 0757       Assessment/Plan HTN HLD Asthma S/p pacemaker Hyponatremia Anemia  ACUTE CHOLECYSTITIS S/pLAPAROSCOPIC CHOLECYSTECTOMY6/8 Dr. Derrell Lolling -POD#3 - needs 10 total days of antibiotics  ID -rocephin6/6>>day#5/10 VTE -SCDs, lovenox on hold due to anemia FEN -IVF, soft diet, Boost  Foley -none Follow up -DOW clinic  Plan- Labs pending. Advance diet and continue bowel regimen. Continue PT, mobilize. Continue antibiotics as above (ok to switch to oral abx at discharge). Patient is ok for discharge if labs stable and she tolerates PO intake. Discharge instructions and follow up info on AVS.   LOS: 5 days    Franne Forts, [facility] 09/22/2019, 8:04 AM Please see Amion for pager number during day hours 7:00am-4:30pm

## 2019-09-23 ENCOUNTER — Encounter (HOSPITAL_COMMUNITY): Payer: Self-pay | Admitting: Internal Medicine

## 2019-09-23 ENCOUNTER — Inpatient Hospital Stay (HOSPITAL_COMMUNITY): Payer: Medicare Other

## 2019-09-23 LAB — COMPREHENSIVE METABOLIC PANEL
ALT: 27 U/L (ref 0–44)
AST: 24 U/L (ref 15–41)
Albumin: 2 g/dL — ABNORMAL LOW (ref 3.5–5.0)
Alkaline Phosphatase: 122 U/L (ref 38–126)
Anion gap: 11 (ref 5–15)
BUN: 7 mg/dL — ABNORMAL LOW (ref 8–23)
CO2: 19 mmol/L — ABNORMAL LOW (ref 22–32)
Calcium: 8.2 mg/dL — ABNORMAL LOW (ref 8.9–10.3)
Chloride: 101 mmol/L (ref 98–111)
Creatinine, Ser: 0.65 mg/dL (ref 0.44–1.00)
GFR calc Af Amer: >60 mL/min (ref >60)
GFR calc non Af Amer: >60 mL/min (ref >60)
Glucose, Bld: 138 mg/dL — ABNORMAL HIGH (ref 70–99)
Potassium: 3.6 mmol/L (ref 3.5–5.1)
Sodium: 131 mmol/L — ABNORMAL LOW (ref 135–145)
Total Bilirubin: 0.5 mg/dL (ref 0.3–1.2)
Total Protein: 5.5 g/dL — ABNORMAL LOW (ref 6.5–8.1)

## 2019-09-23 LAB — CBC WITH DIFFERENTIAL/PLATELET
Abs Immature Granulocytes: 0.57 10*3/uL — ABNORMAL HIGH (ref 0.00–0.07)
Basophils Absolute: 0 10*3/uL (ref 0.0–0.1)
Basophils Relative: 0 %
Eosinophils Absolute: 0 10*3/uL (ref 0.0–0.5)
Eosinophils Relative: 0 %
HCT: 28 % — ABNORMAL LOW (ref 36.0–46.0)
Hemoglobin: 9.3 g/dL — ABNORMAL LOW (ref 12.0–15.0)
Immature Granulocytes: 3 %
Lymphocytes Relative: 5 %
Lymphs Abs: 1 10*3/uL (ref 0.7–4.0)
MCH: 30.6 pg (ref 26.0–34.0)
MCHC: 33.2 g/dL (ref 30.0–36.0)
MCV: 92.1 fL (ref 80.0–100.0)
Monocytes Absolute: 0.8 10*3/uL (ref 0.1–1.0)
Monocytes Relative: 4 %
Neutro Abs: 17.3 10*3/uL — ABNORMAL HIGH (ref 1.7–7.7)
Neutrophils Relative %: 88 %
Platelets: 349 10*3/uL (ref 150–400)
RBC: 3.04 MIL/uL — ABNORMAL LOW (ref 3.87–5.11)
RDW: 14 % (ref 11.5–15.5)
WBC: 19.7 10*3/uL — ABNORMAL HIGH (ref 4.0–10.5)
nRBC: 0.1 % (ref 0.0–0.2)

## 2019-09-23 MED ORDER — IOHEXOL 300 MG/ML  SOLN
100.0000 mL | Freq: Once | INTRAMUSCULAR | Status: AC | PRN
  Administered 2019-09-23: 100 mL via INTRAVENOUS

## 2019-09-23 NOTE — Discharge Summary (Signed)
Physician Discharge Summary  [name] DXI:[id number] DOB: 11/05/1924 DOA: 09/17/2019  PCP: Sarah Rakers, MD  Admit date: 09/17/2019 Discharge date: 09/23/2019  Admitted From: Home Disposition: Home  Recommendations for Outpatient Follow-up:  1. Follow up with PCP in 1-2 weeks 2. Follow with general surgery as a scheduled in 12 days 3. Please obtain BMP/CBC in one week 4. Please follow up with your PCP on the following pending results: Unresulted Labs (From admission, onward) Comment          Start     Ordered   09/23/19 0742  CBC with Differential/Platelet  Once,   R       Question:  Specimen collection method  Answer:  Lab=Lab collect   09/23/19 0741   09/21/19 0500  CBC with Differential/Platelet  Daily,   R      09/20/19 1055           Home Health: Yes Equipment/Devices: Commode  Discharge Condition: Stable CODE STATUS: Full code Diet recommendation: Cardiac  Subjective: Seen and examined this morning.  She feels much better with no abdominal pain.  Passing gas.  Tolerating soft diet.  Brief/Interim Summary: Sarah Burns an 86 y.o.femalewith a past medical history that includes hypertension, hyperlipidemia, asthma, pacemaker admitted6/4from home with a chief complaint abdominal pain/nausea and vomiting. Work-up in the emergency department included a CT scan that noted acute cholecystitis. Patient also had a 1 cm stone in the cystic duct. She was admitted under hospital service.  Medical management was started with antibiotics. General surgery consulted.  Initial plan was to continue medical management as patient wanted to avoid surgery but eventually patient ended up having laparoscopic cholecystectomy on 09/19/2019.  Patient had slow improvement post operative.  She was kept n.p.o. for at least 1 day and then started on clears which was continued for another 24 hours.  Subsequently, patient was advanced to soft diet on 09/22/2019 which she has tolerated and today she  had no abdominal pain although she still had abdominal tenderness.  She was seen by general surgery/Dr. Derrell Lolling this morning and was cleared for discharge as well however due to elevated white cells, I ended up doing CT abdomen and pelvis with contrast which showed possibility of mild postoperative ileus.  I called Dr. Derrell Lolling after the CT scan results about his recommendations and he once again cleared the patient for discharge.  I then spoke to patient's daughter about the discharge plan.  Patient is being discharged in stable condition.  She still has leukocytosis but it is unknown whether this is chronic as patient does not seem to have any signs of acute infection now.  Resume all home medications.  Discharge Diagnoses:  Principal Problem:   Acute cholecystitis Active Problems:   Pressure injury of skin   Hyponatremia   Hypertension   Asthma   Allergies   Hyperlipidemia   Anemia    Discharge Instructions   Allergies as of 09/23/2019      Reactions   Lactose Intolerance (gi) Nausea And Vomiting, Other (See Comments)   Stomach pain   Penicillins Other (See Comments)   Unknown reaction      Medication List    TAKE these medications   acetaminophen 500 MG tablet Commonly known as: TYLENOL Take 500 mg by mouth daily as needed for headache (pain).   amLODipine 10 MG tablet Commonly known as: NORVASC Take 10 mg by mouth daily.   atorvastatin 80 MG tablet Commonly known as: LIPITOR Take 80 mg by mouth  daily.   CALCIUM 600-D PO Take 1 tablet by mouth 2 (two) times daily.   fluticasone-salmeterol 115-21 MCG/ACT inhaler Commonly known as: ADVAIR HFA Inhale 2 puffs into the lungs 2 (two) times daily as needed (shortness of breath/wheezing).   LACTAID PO Take 1 tablet by mouth as needed (when consuming dairy).   losartan 50 MG tablet Commonly known as: COZAAR Take 50 mg by mouth daily.   Voltaren 1 % Gel Generic drug: diclofenac Sodium Apply 1 application topically 2  (two) times daily as needed (knee pain).            Durable Medical Equipment  (From admission, onward)         Start     Ordered   09/21/19 0754  For home use only DME Eelevated commode seat  Once        09/21/19 0753          Follow-up Information    [facility] Go on 10/05/2019.   Specialty: General Surgery Why: Your appointment is 10/05/19 at 4 pm Please arrive 30 minutes prior to your appointment to check in and fill out paperwork. Bring photo ID and insurance information. Contact information: [address] [place] [address] [phone]       Sarah Rakers, MD Follow up in 1 week(s).   Specialty: Family Medicine Contact information: [address] [place] [address] [phone]              Allergies  Allergen Reactions  . Lactose Intolerance (Gi) Nausea And Vomiting and Other (See Comments)    Stomach pain  . Penicillins Other (See Comments)    Unknown reaction     Consultations: General surgery   Procedures/Studies: CT ABDOMEN PELVIS W CONTRAST  Result Date: 09/23/2019 CLINICAL DATA:  Status post cholecystectomy on 09/19/2019 to treat acute cholecystitis. Lethargy and nausea. EXAM: CT ABDOMEN AND PELVIS WITH CONTRAST TECHNIQUE: Multidetector CT imaging of the abdomen and pelvis was performed using the standard protocol following bolus administration of intravenous contrast. CONTRAST:  OMNIPAQUE IOHEXOL 300 MG/ML  SOLN COMPARISON:  Prior CT of the abdomen and pelvis on 09/17/2019 FINDINGS: Lower chest: Small bilateral pleural effusions, right greater than left with associated right lower lobe atelectasis. Hepatobiliary: Liver is unremarkable. Within the gallbladder fossa, some postoperative fluid and air is present. Some of this air may be related to hemostatic packing material. There is a small amount of free fluid adjacent to the dome of the liver. Pancreas: Unremarkable. No  pancreatic ductal dilatation or surrounding inflammatory changes. Spleen: Small spleen. Adrenals/Urinary Tract: Adrenal glands are unremarkable. Kidneys are normal, without renal calculi, focal lesion, or hydronephrosis. Bladder is unremarkable. Stomach/Bowel: A few mildly prominent small bowel loops containing fluid and air may relate to a mild postoperative ileus. No evidence of free intraperitoneal air or significant colonic distension. Vascular/Lymphatic: Stable extensive atherosclerosis of the abdominal aorta and iliac arteries without evidence of aneurysm. No enlarged lymph nodes identified. Reproductive: Uterus and bilateral adnexa are unremarkable. Other: Small amount of free fluid in the pelvis. Body wall edema. No discrete marginated abscess. No hernias. Musculoskeletal: No acute or significant osseous findings. IMPRESSION: 1. Small bilateral pleural effusions, right greater than left, with associated right lower lobe atelectasis. 2. Some postoperative fluid and air is present in the gallbladder fossa. Some of this air density may be related to absorbable hemostatic surgical packing material. 3. Small amount of free fluid in the abdomen and  pelvis. 4. A few mildly prominent small bowel loops containing fluid and air may relate to a mild postoperative ileus. 5. Body wall edema. No discrete marginated abscess. 6. Stable extensive atherosclerosis of the abdominal aorta and iliac arteries without evidence of aneurysm. Electronically Signed   By: Irish Lack M.D.   On: 09/23/2019 12:39   CT ABDOMEN PELVIS W CONTRAST  Result Date: 09/17/2019 CLINICAL DATA:  Pt. Is lactose intolerant and had ice cream and a little wine. Per EMS, pt. stomach started hurting and she started feeling sick. EXAM: CT ABDOMEN AND PELVIS WITH CONTRAST TECHNIQUE: Multidetector CT imaging of the abdomen and pelvis was performed using the standard protocol following bolus administration of intravenous contrast. CONTRAST:   OMNIPAQUE IOHEXOL 300 MG/ML  SOLN COMPARISON:  None. FINDINGS: Lower chest: Linear and reticular lower lobe opacities are noted consistent with chronic scarring/atelectasis. No acute lung base abnormalities. Hepatobiliary: 1 cm focus of enhancement in segment 7, nonspecific. No other liver mass or lesion. Liver normal in size and overall attenuation. Gallbladder is distended with wall thickening and adjacent inflammation. There is a 1 cm stone in the cystic duct. Common bile duct is normal in caliber for age. Pancreas: Unremarkable. No pancreatic ductal dilatation or surrounding inflammatory changes. Spleen: Normal in size without focal abnormality. Adrenals/Urinary Tract: No adrenal masses. Kidneys normal in size, orientation and position with symmetric enhancement and excretion. No renal masses, stones or hydronephrosis. Normal ureters. Normal bladder. Stomach/Bowel: Normal stomach. Small bowel and colon are normal in caliber. No wall thickening. No inflammation. Normal appendix visualized. Vascular/Lymphatic: Extensive aortic and iliac artery atherosclerosis. No aneurysm. No enlarged lymph nodes. Reproductive: Uterus and bilateral adnexa are unremarkable. Other: Trace amount of ascites, with fluid attenuation adjacent to the gallbladder and collecting in the posterior pelvic recess. Musculoskeletal: No fracture or acute finding. No osteoblastic or osteolytic lesions. IMPRESSION: 1. Acute cholecystitis. Gallbladder is distended with wall thickening and adjacent inflammation. There is a 1 cm stone within the cystic duct. Trace amount of associated ascites. 2. No other acute abnormality within the abdomen or pelvis. 3. Extensive aortoiliac atherosclerosis. Electronically Signed   By: Amie Portland M.D.   On: 09/17/2019 06:29   ECHOCARDIOGRAM COMPLETE  Result Date: 09/18/2019    ECHOCARDIOGRAM REPORT   Patient Name:   MAITLYN Vandoren Date of Exam: 09/18/2019 Medical Rec #:  [id number]    Height:       51.0 in Accession  #:    [id number]   Weight:       120.0 lb Date of Birth:  1925-04-10    BSA:          1.335 m Patient Age:    86 years     BP:           143/46 mmHg Patient Gender: F            HR:           71 bpm. Exam Location:  Inpatient Procedure: 2D Echo, Color Doppler and Cardiac Doppler Indications:    Pre-operative cardiovascular exam z01.810  History:        Patient has no prior history of Echocardiogram examinations.                 Pacemaker; Risk Factors:Hypertension and Dyslipidemia.  Sonographer:    Irving Burton Senior RDCS Referring Phys: [phone] JESSICA U [facility]  Sonographer Comments: Technically difficult parasternal window due to small rib spacing. IMPRESSIONS  1. Left ventricular ejection fraction, by estimation, is  65 to 70%. The left ventricle has normal function. The left ventricle has no regional wall motion abnormalities. Left ventricular diastolic parameters are consistent with Grade I diastolic dysfunction (impaired relaxation). Elevated left ventricular end-diastolic pressure.  2. Right ventricular systolic function is normal. The right ventricular size is normal. There is mildly elevated pulmonary artery systolic pressure.  3. Left atrial size was moderately dilated.  4. The mitral valve is abnormal. Mild mitral valve regurgitation.  5. The aortic valve is tricuspid. Aortic valve regurgitation is not visualized.  6. The inferior vena cava is normal in size with greater than 50% respiratory variability, suggesting right atrial pressure of 3 mmHg. FINDINGS  Left Ventricle: Left ventricular ejection fraction, by estimation, is 65 to 70%. The left ventricle has normal function. The left ventricle has no regional wall motion abnormalities. The left ventricular internal cavity size was normal in size. There is  no left ventricular hypertrophy. Left ventricular diastolic parameters are consistent with Grade I diastolic dysfunction (impaired relaxation). Elevated left ventricular end-diastolic pressure. Right Ventricle:  The right ventricular size is normal. No increase in right ventricular wall thickness. Right ventricular systolic function is normal. There is mildly elevated pulmonary artery systolic pressure. The tricuspid regurgitant velocity is 2.96  m/s, and with an assumed right atrial pressure of 3 mmHg, the estimated right ventricular systolic pressure is 31.5 mmHg. Left Atrium: Left atrial size was moderately dilated. Right Atrium: Right atrial size was normal in size. Pericardium: There is no evidence of pericardial effusion. Mitral Valve: The mitral valve is abnormal. There is mild thickening of the mitral valve leaflet(s). Mild mitral valve regurgitation. Tricuspid Valve: The tricuspid valve is grossly normal. Tricuspid valve regurgitation is trivial. Aortic Valve: The aortic valve is tricuspid. Aortic valve regurgitation is not visualized. Pulmonic Valve: The pulmonic valve was grossly normal. Pulmonic valve regurgitation is not visualized. Aorta: The aortic root and ascending aorta are structurally normal, with no evidence of dilitation. Venous: The inferior vena cava is normal in size with greater than 50% respiratory variability, suggesting right atrial pressure of 3 mmHg. IAS/Shunts: No atrial level shunt detected by color flow Doppler. Additional Comments: A pacer wire is visualized.  LEFT VENTRICLE PLAX 2D LVIDd:         4.10 cm  Diastology LVIDs:         2.70 cm  LV e' lateral:   5.00 cm/s LV PW:         1.00 cm  LV E/e' lateral: 20.2 LV IVS:        0.90 cm  LV e' medial:    5.77 cm/s LVOT diam:     1.80 cm  LV E/e' medial:  17.5 LV SV:         69 LV SV Index:   51 LVOT Area:     2.54 cm  RIGHT VENTRICLE RV S prime:     12.70 cm/s TAPSE (M-mode): 2.5 cm LEFT ATRIUM             Index       RIGHT ATRIUM           Index LA diam:        3.70 cm 2.77 cm/m  RA Area:     11.40 cm LA Vol (A2C):   42.8 ml 32.06 ml/m RA Volume:   23.60 ml  17.68 ml/m LA Vol (A4C):   67.1 ml 50.26 ml/m LA Biplane Vol: 56.9 ml 42.62  ml/m  AORTIC VALVE LVOT Vmax:  105.00 cm/s LVOT Vmean:  75.200 cm/s LVOT VTI:    0.270 m  AORTA Ao Root diam: 2.60 cm Ao Asc diam:  2.20 cm MITRAL VALVE                TRICUSPID VALVE MV Area (PHT): 2.69 cm     TR Peak grad:   35.0 mmHg MV Decel Time: 282 msec     TR Vmax:        296.00 cm/s MV E velocity: 101.00 cm/s MV A velocity: 142.00 cm/s  SHUNTS MV E/A ratio:  0.71         Systemic VTI:  0.27 m                             Systemic Diam: 1.80 cm Zoila Shutter MD Electronically signed by Zoila Shutter MD Signature Date/Time: 09/18/2019/1:07:49 PM    Final       Discharge Exam: Vitals:   09/23/19 0939 09/23/19 1229  BP: (!) 161/47 (!) 154/51  Pulse: 100 98  Resp:  16  Temp:  98.3 F (36.8 C)  SpO2: 94% 95%   Vitals:   09/23/19 0602 09/23/19 0810 09/23/19 0939 09/23/19 1229  BP: (!) 168/54  (!) 161/47 (!) 154/51  Pulse: 94  100 98  Resp: 16   16  Temp: 98.2 F (36.8 C)   98.3 F (36.8 C)  TempSrc: Oral   Oral  SpO2: 92% 100% 94% 95%  Weight:      Height:        General: Pt is alert, awake, not in acute distress Cardiovascular: RRR, S1/S2 +, no rubs, no gallops Respiratory: CTA bilaterally, no wheezing, no rhonchi Abdominal: Soft, mild  generalized tenderness, ND, bowel sounds + Extremities: no edema, no cyanosis    The results of significant diagnostics from this hospitalization (including imaging, microbiology, ancillary and laboratory) are listed below for reference.     Microbiology: Recent Results (from the past 240 hour(s))  SARS Coronavirus 2 by RT PCR (hospital order, performed in [facility] hospital lab) Nasopharyngeal Nasopharyngeal Swab     Status: None   Collection Time: 09/17/19  7:28 AM   Specimen: Nasopharyngeal Swab  Result Value Ref Range Status   SARS Coronavirus 2 NEGATIVE NEGATIVE Final    Comment: (NOTE) SARS-CoV-2 target nucleic acids are NOT DETECTED. The SARS-CoV-2 RNA is generally detectable in upper and lower respiratory specimens during  the acute phase of infection. The lowest concentration of SARS-CoV-2 viral copies this assay can detect is 250 copies / mL. A negative result does not preclude SARS-CoV-2 infection and should not be used as the sole basis for treatment or other patient management decisions.  A negative result may occur with improper specimen collection / handling, submission of specimen other than nasopharyngeal swab, presence of viral mutation(s) within the areas targeted by this assay, and inadequate number of viral copies (<250 copies / mL). A negative result must be combined with clinical observations, patient history, and epidemiological information. Fact Sheet for Patients:   BoilerBrush.com.cy Fact Sheet for Healthcare Providers: https://pope.com/ This test is not yet approved or cleared  by the [place] FDA and has been authorized for detection and/or diagnosis of SARS-CoV-2 by FDA under an Emergency Use Authorization (EUA).  This EUA will remain in effect (meaning this test can be used) for the duration of the COVID-19 declaration under Section 564(b)(1) of the Act, 21 U.S.C. section 360bbb-3(b)(1), unless the  authorization is terminated or revoked sooner. Performed at [facility], [address] [place], [place] [address]   Culture, Urine     Status: None   Collection Time: 09/18/19 10:31 AM   Specimen: Urine, Clean Catch  Result Value Ref Range Status   Specimen Description URINE, CLEAN CATCH  Final   Special Requests NONE  Final   Culture   Final    NO GROWTH Performed at [facility], [address] [place], [place] [address]    Report Status 09/19/2019 FINAL  Final  Surgical pcr screen     Status: None   Collection Time: 09/19/19  6:25 AM   Specimen: Nasal Mucosa; Nasal Swab  Result Value Ref Range Status   MRSA, PCR NEGATIVE NEGATIVE Final   Staphylococcus aureus NEGATIVE NEGATIVE Final    Comment: (NOTE) The  Xpert SA Assay (FDA approved for NASAL specimens in patients 84 years of age and older), is one component of a comprehensive surveillance program. It is not intended to diagnose infection nor to guide or monitor treatment. Performed at [facility], [address] [place], [place] [address]      Labs: BNP (last 3 results) No results for input(s): BNP in the last 8760 hours. Basic Metabolic Panel: Recent Labs  Lab 09/19/19 0716 09/20/19 0550 09/21/19 0630 09/22/19 0744 09/23/19 1057  NA 130* 135 134* 129* 131*  K 3.3* 4.5 3.4* 4.2 3.6  CL 101 101 101 101 101  CO2 21* 20* 19* 20* 19*  GLUCOSE 118* 168* 146* 143* 138*  BUN 9 9 8 8  7*  CREATININE 0.86 0.81 0.79 0.75 0.65  CALCIUM 8.1* 8.8* 8.5* 8.6* 8.2*  MG  --   --  1.9  --   --    Liver Function Tests: Recent Labs  Lab 09/17/19 0346 09/18/19 0751 09/21/19 0630 09/22/19 0744 09/23/19 1057  AST 24 18 28 21 24   ALT 19 16 39 29 27  ALKPHOS 64 65 82 103 122  BILITOT 0.8 0.9 0.5 0.6 0.5  PROT 6.8 5.8* 5.5* 5.6* 5.5*  ALBUMIN 3.5 2.7* 2.1* 2.1* 2.0*   Recent Labs  Lab 09/17/19 0346  LIPASE 27   No results for input(s): AMMONIA in the last 168 hours. CBC: Recent Labs  Lab 09/17/19 0346 09/18/19 0751 09/19/19 0716 09/20/19 0550 09/21/19 0630 09/22/19 0744 09/23/19 0726  WBC 21.8*   < > 14.7* 16.7* 22.5* 22.1* 19.7*  NEUTROABS 20.0*  --   --   --  19.4* 18.9* 17.3*  HGB 11.6*   < > 9.4* 11.1* 9.3* 9.5* 9.3*  HCT 34.3*   < > 28.3* 32.9* 28.2* 28.5* 28.0*  MCV 91.0   < > 92.5 91.4 91.9 91.6 92.1  PLT 295   < > 229 310 290 328 349   < > = values in this interval not displayed.   Cardiac Enzymes: No results for input(s): CKTOTAL, CKMB, CKMBINDEX, TROPONINI in the last 168 hours. BNP: Invalid input(s): POCBNP CBG: No results for input(s): GLUCAP in the last 168 hours. D-Dimer No results for input(s): DDIMER in the last 72 hours. Hgb A1c No results for input(s): HGBA1C in the last 72 hours. Lipid  Profile No results for input(s): CHOL, HDL, LDLCALC, TRIG, CHOLHDL, LDLDIRECT in the last 72 hours. Thyroid function studies No results for input(s): TSH, T4TOTAL, T3FREE, THYROIDAB in the last 72 hours.  Invalid input(s): FREET3 Anemia work up No results for input(s): VITAMINB12, FOLATE, FERRITIN, TIBC,  IRON, RETICCTPCT in the last 72 hours. Urinalysis    Component Value Date/Time   COLORURINE YELLOW 09/18/2019 1030   APPEARANCEUR CLEAR 09/18/2019 1030   LABSPEC 1.018 09/18/2019 1030   PHURINE 6.0 09/18/2019 1030   GLUCOSEU NEGATIVE 09/18/2019 1030   HGBUR MODERATE (A) 09/18/2019 1030   BILIRUBINUR NEGATIVE 09/18/2019 1030   KETONESUR NEGATIVE 09/18/2019 1030   PROTEINUR >=300 (A) 09/18/2019 1030   NITRITE NEGATIVE 09/18/2019 1030   LEUKOCYTESUR NEGATIVE 09/18/2019 1030   Sepsis Labs Invalid input(s): PROCALCITONIN,  WBC,  LACTICIDVEN Microbiology Recent Results (from the past 240 hour(s))  SARS Coronavirus 2 by RT PCR (hospital order, performed in [facility] hospital lab) Nasopharyngeal Nasopharyngeal Swab     Status: None   Collection Time: 09/17/19  7:28 AM   Specimen: Nasopharyngeal Swab  Result Value Ref Range Status   SARS Coronavirus 2 NEGATIVE NEGATIVE Final    Comment: (NOTE) SARS-CoV-2 target nucleic acids are NOT DETECTED. The SARS-CoV-2 RNA is generally detectable in upper and lower respiratory specimens during the acute phase of infection. The lowest concentration of SARS-CoV-2 viral copies this assay can detect is 250 copies / mL. A negative result does not preclude SARS-CoV-2 infection and should not be used as the sole basis for treatment or other patient management decisions.  A negative result may occur with improper specimen collection / handling, submission of specimen other than nasopharyngeal swab, presence of viral mutation(s) within the areas targeted by this assay, and inadequate number of viral copies (<250 copies / mL). A negative result must be  combined with clinical observations, patient history, and epidemiological information. Fact Sheet for Patients:   BoilerBrush.com.cy Fact Sheet for Healthcare Providers: https://pope.com/ This test is not yet approved or cleared  by the [place] FDA and has been authorized for detection and/or diagnosis of SARS-CoV-2 by FDA under an Emergency Use Authorization (EUA).  This EUA will remain in effect (meaning this test can be used) for the duration of the COVID-19 declaration under Section 564(b)(1) of the Act, 21 U.S.C. section 360bbb-3(b)(1), unless the authorization is terminated or revoked sooner. Performed at [facility], [address], [place], [place] [address]   Culture, Urine     Status: None   Collection Time: 09/18/19 10:31 AM   Specimen: Urine, Clean Catch  Result Value Ref Range Status   Specimen Description URINE, CLEAN CATCH  Final   Special Requests NONE  Final   Culture   Final    NO GROWTH Performed at [facility], [address], [place], [place] [address]    Report Status 09/19/2019 FINAL  Final  Surgical pcr screen     Status: None   Collection Time: 09/19/19  6:25 AM   Specimen: Nasal Mucosa; Nasal Swab  Result Value Ref Range Status   MRSA, PCR NEGATIVE NEGATIVE Final   Staphylococcus aureus NEGATIVE NEGATIVE Final    Comment: (NOTE) The Xpert SA Assay (FDA approved for NASAL specimens in patients 65 years of age and older), is one component of a comprehensive surveillance program. It is not intended to diagnose infection nor to guide or monitor treatment. Performed at [facility], [address], [place], [place] [address]      Time coordinating discharge: Over 30 minutes  SIGNED:   Hughie Closs, MD  [facility] 09/23/2019, 2:45 PM  If 7PM-7AM, please contact night-coverage [email or web address]

## 2019-09-23 NOTE — Progress Notes (Signed)
4 Days Post-Op   Subjective/Chief Complaint: Pt doing well tol soft diet   Objective: Vital signs in last 24 hours: Temp:  [98 F (36.7 C)-98.3 F (36.8 C)] 98.2 F (36.8 C) (06/12 0602) Pulse Rate:  [89-94] 94 (06/12 0602) Resp:  [16-18] 16 (06/12 0602) BP: (145-168)/(53-55) 168/54 (06/12 0602) SpO2:  [92 %-99 %] 92 % (06/12 0602)    Intake/Output from previous day: 06/11 0701 - 06/12 0700 In: 809.3 [P.O.:120; I.V.:589.3; IV Piggyback:100] Out: -  Intake/Output this shift: No intake/output data recorded.  PE:  inc c/d/i   Lab Results:  Recent Labs    09/21/19 0630 09/22/19 0744  WBC 22.5* 22.1*  HGB 9.3* 9.5*  HCT 28.2* 28.5*  PLT 290 328   BMET Recent Labs    09/21/19 0630 09/22/19 0744  NA 134* 129*  K 3.4* 4.2  CL 101 101  CO2 19* 20*  GLUCOSE 146* 143*  BUN 8 8  CREATININE 0.79 0.75  CALCIUM 8.5* 8.6*   PT/INR No results for input(s): LABPROT, INR in the last 72 hours. ABG No results for input(s): PHART, HCO3 in the last 72 hours.  Invalid input(s): PCO2, PO2  Studies/Results: No results found.  Anti-infectives: Anti-infectives (From admission, onward)   Start     Dose/Rate Route Frequency Ordered Stop   09/18/19 0700  cefTRIAXone (ROCEPHIN) 2 g in sodium chloride 0.9 % 100 mL IVPB     Discontinue     2 g 200 mL/hr over 30 Minutes Intravenous Every 24 hours 09/17/19 1827     09/17/19 0645  cefTRIAXone (ROCEPHIN) 2 g in sodium chloride 0.9 % 100 mL IVPB        2 g 200 mL/hr over 30 Minutes Intravenous  Once 09/17/19 0644 09/17/19 0757      Assessment/Plan: s/p Procedure(s): LAPAROSCOPIC CHOLECYSTECTOMY (N/A) Indocyanine Green Fluorescence Imaging (Icg) (N/A) Advance diet  OK to DC form surgery standpoint  LOS: 6 days    Axel Filler 09/23/2019

## 2019-09-23 NOTE — Progress Notes (Signed)
Nsg Discharge Note  Patient discharged home. Daughter present for teaching. Questions answered. Patient stated that she did not need an elevated commode seat to take home.  Admit Date:  09/17/2019 Discharge date: 09/23/2019   Rachel Moulds to be D/C'd Home per MD order.  AVS completed.  Copy for chart, and copy for patient signed, and dated. Patient/caregiver able to verbalize understanding.  Discharge Medication: Allergies as of 09/23/2019      Reactions   Lactose Intolerance (gi) Nausea And Vomiting, Other (See Comments)   Stomach pain   Penicillins Other (See Comments)   Unknown reaction      Medication List    TAKE these medications   acetaminophen 500 MG tablet Commonly known as: TYLENOL Take 500 mg by mouth daily as needed for headache (pain).   amLODipine 10 MG tablet Commonly known as: NORVASC Take 10 mg by mouth daily.   atorvastatin 80 MG tablet Commonly known as: LIPITOR Take 80 mg by mouth daily.   CALCIUM 600-D PO Take 1 tablet by mouth 2 (two) times daily.   fluticasone-salmeterol 115-21 MCG/ACT inhaler Commonly known as: ADVAIR HFA Inhale 2 puffs into the lungs 2 (two) times daily as needed (shortness of breath/wheezing).   LACTAID PO Take 1 tablet by mouth as needed (when consuming dairy).   losartan 50 MG tablet Commonly known as: COZAAR Take 50 mg by mouth daily.   Voltaren 1 % Gel Generic drug: diclofenac Sodium Apply 1 application topically 2 (two) times daily as needed (knee pain).            Durable Medical Equipment  (From admission, onward)         Start     Ordered   09/21/19 0754  For home use only DME Eelevated commode seat  Once        09/21/19 0753          Discharge Assessment: Vitals:   09/23/19 0939 09/23/19 1229  BP: (!) 161/47 (!) 154/51  Pulse: 100 98  Resp:  16  Temp:  98.3 F (36.8 C)  SpO2: 94% 95%   Skin clean, dry and intact without evidence of skin break down, no evidence of skin tears noted. IV  catheter discontinued intact. Site without signs and symptoms of complications - no redness or edema noted at insertion site, patient denies c/o pain - only slight tenderness at site.  Dressing with slight pressure applied.  D/c Instructions-Education: Discharge instructions given to patient/family with verbalized understanding. D/c education completed with patient/family including follow up instructions, medication list, d/c activities limitations if indicated, with other d/c instructions as indicated by MD - patient able to verbalize understanding, all questions fully answered. Patient instructed to return to ED, call 911, or call MD for any changes in condition.  Patient escorted via WC, and D/C home via private auto.  Boykin Nearing, RN 09/23/2019 6:18 PM

## 2019-09-23 NOTE — TOC Transition Note (Signed)
Transition of Care [facility]) - CM/SW Discharge Note   Patient Details  Name: Sarah Burns MRN: [id number] Date of Birth: Sep 01, 1924  [facility] CM/SW Contact:  Lawerance Sabal, RN Phone Number: 09/23/2019, 3:05 PM   Clinical Narrative:    LVM requesting callback from patient's daughter. Need to ascertain which [facility] company she would like to use.  [name], [name] Daughter   [phone]         Barriers to Discharge: Continued Medical Work up   Patient Goals and CMS Choice Patient states their goals for this hospitalization and ongoing recovery are:: for her to get stronger/return home CMS Medicare.gov Compare Post Acute Care list provided to:: Patient Represenative (must comment) (pt daughter) Choice offered to / list presented to : Adult Children, Patient  Discharge Placement                       Discharge Plan and Services In-house Referral: Clinical Social Work Discharge Planning Services: CM Consult Post Acute Care Choice: Durable Medical Equipment, Home Health                               Social Determinants of Health (SDOH) Interventions     Readmission Risk Interventions Readmission Risk Prevention Plan 09/20/2019  Transportation Screening Complete  PCP or Specialist Appt within 5-7 Days Complete  Home Care Screening Complete  Medication Review (RN CM) Referral to Pharmacy

## 2019-10-24 DIAGNOSIS — I10 Essential (primary) hypertension: Secondary | ICD-10-CM | POA: Diagnosis not present

## 2019-10-24 DIAGNOSIS — M130 Polyarthritis, unspecified: Secondary | ICD-10-CM | POA: Diagnosis not present

## 2019-11-14 DIAGNOSIS — I10 Essential (primary) hypertension: Secondary | ICD-10-CM | POA: Diagnosis not present

## 2019-11-14 DIAGNOSIS — E785 Hyperlipidemia, unspecified: Secondary | ICD-10-CM | POA: Diagnosis not present

## 2019-11-14 DIAGNOSIS — M130 Polyarthritis, unspecified: Secondary | ICD-10-CM | POA: Diagnosis not present

## 2019-11-14 DIAGNOSIS — I110 Hypertensive heart disease with heart failure: Secondary | ICD-10-CM | POA: Diagnosis not present

## 2019-11-29 DIAGNOSIS — E785 Hyperlipidemia, unspecified: Secondary | ICD-10-CM | POA: Diagnosis not present

## 2019-11-29 DIAGNOSIS — R69 Illness, unspecified: Secondary | ICD-10-CM | POA: Diagnosis not present

## 2019-11-29 DIAGNOSIS — J45909 Unspecified asthma, uncomplicated: Secondary | ICD-10-CM | POA: Diagnosis not present

## 2019-11-29 DIAGNOSIS — Z8249 Family history of ischemic heart disease and other diseases of the circulatory system: Secondary | ICD-10-CM | POA: Diagnosis not present

## 2019-11-29 DIAGNOSIS — R32 Unspecified urinary incontinence: Secondary | ICD-10-CM | POA: Diagnosis not present

## 2019-11-29 DIAGNOSIS — Z809 Family history of malignant neoplasm, unspecified: Secondary | ICD-10-CM | POA: Diagnosis not present

## 2019-11-29 DIAGNOSIS — Z823 Family history of stroke: Secondary | ICD-10-CM | POA: Diagnosis not present

## 2019-11-29 DIAGNOSIS — R609 Edema, unspecified: Secondary | ICD-10-CM | POA: Diagnosis not present

## 2019-11-29 DIAGNOSIS — I10 Essential (primary) hypertension: Secondary | ICD-10-CM | POA: Diagnosis not present

## 2019-11-29 DIAGNOSIS — Z7951 Long term (current) use of inhaled steroids: Secondary | ICD-10-CM | POA: Diagnosis not present

## 2020-01-16 DIAGNOSIS — I10 Essential (primary) hypertension: Secondary | ICD-10-CM | POA: Diagnosis not present

## 2020-01-16 DIAGNOSIS — I110 Hypertensive heart disease with heart failure: Secondary | ICD-10-CM | POA: Diagnosis not present

## 2020-01-16 DIAGNOSIS — M130 Polyarthritis, unspecified: Secondary | ICD-10-CM | POA: Diagnosis not present

## 2020-01-16 DIAGNOSIS — E785 Hyperlipidemia, unspecified: Secondary | ICD-10-CM | POA: Diagnosis not present

## 2020-01-16 DIAGNOSIS — Z23 Encounter for immunization: Secondary | ICD-10-CM | POA: Diagnosis not present

## 2020-02-27 DIAGNOSIS — M130 Polyarthritis, unspecified: Secondary | ICD-10-CM | POA: Diagnosis not present

## 2020-02-27 DIAGNOSIS — I10 Essential (primary) hypertension: Secondary | ICD-10-CM | POA: Diagnosis not present

## 2020-02-27 DIAGNOSIS — I110 Hypertensive heart disease with heart failure: Secondary | ICD-10-CM | POA: Diagnosis not present

## 2020-02-27 DIAGNOSIS — J4520 Mild intermittent asthma, uncomplicated: Secondary | ICD-10-CM | POA: Diagnosis not present

## 2020-02-27 DIAGNOSIS — E785 Hyperlipidemia, unspecified: Secondary | ICD-10-CM | POA: Diagnosis not present

## 2020-03-11 ENCOUNTER — Ambulatory Visit: Payer: PRIVATE HEALTH INSURANCE | Admitting: Cardiology

## 2020-03-11 ENCOUNTER — Other Ambulatory Visit: Payer: Self-pay

## 2020-03-11 ENCOUNTER — Encounter: Payer: Self-pay | Admitting: Cardiology

## 2020-03-11 VITALS — BP 152/65 | HR 85 | Ht <= 58 in | Wt 137.0 lb

## 2020-03-11 DIAGNOSIS — I10 Essential (primary) hypertension: Secondary | ICD-10-CM | POA: Diagnosis not present

## 2020-03-11 DIAGNOSIS — E7800 Pure hypercholesterolemia, unspecified: Secondary | ICD-10-CM

## 2020-03-11 DIAGNOSIS — E871 Hypo-osmolality and hyponatremia: Secondary | ICD-10-CM | POA: Diagnosis not present

## 2020-03-11 DIAGNOSIS — Z950 Presence of cardiac pacemaker: Secondary | ICD-10-CM

## 2020-03-11 DIAGNOSIS — Z87891 Personal history of nicotine dependence: Secondary | ICD-10-CM | POA: Diagnosis not present

## 2020-03-11 NOTE — Progress Notes (Signed)
Date:  03/11/2020   ID:  Alanda Amass, DOB [birth year], MRN [id number]  PCP:  Lucianne Lei, MD  Cardiologist:  Rex Kras, DO, [facility] (established care 03/11/2020) Former Cardiology Providers: [facility] in [place]: Heart Disease  REQUESTING PHYSICIAN:  Lucianne Lei, [address] [place],  [place] [address]  Chief Complaint  Patient presents with  . New Patient (Initial Visit)    Pacemaker check  . Establish Care    HPI  Sarah Burns is a 86 y.o. female who presents to the office with a chief complaint of " pacemaker check and establish care." Patient's past medical history and cardiovascular risk factors include: Presence of a permanent pacemaker, hypertension, hypercholesterolemia, former smoker, postmenopausal female, advanced age.  She is referred to the office at the request of Lucianne Lei, MD for evaluation of heart disease.  Patient is accompanied by her daughter Sarah Burns at today's office visit.  She provides verbal consent in regards to discussing her medical history and plan of care in the presence of her daughter.  Patient states that she recently moved from [place] to [place] back in April 2021 to be closer to her daughter.  She states that prior to leaving [place] she had her pacemaker checked and since then has not followed up with cardiology.  She is here to establish care and have her pacemaker checked.  Patient states that she used to have her pacemaker checked every 6 months.  She does not remember the indication of why the pacemaker was placed but remembers that it is a Medtronic device.  Based on the pacemaker card the Medtronic device was implanted on January 29, 2014 and it is a Advisa DR MRI SureScan Pacemaker.  She is getting her care at [facility].  Otherwise patient does not have any symptoms of angina pectoris or heart failure.  ALLERGIES: Allergies  Allergen Reactions  . Lactose Intolerance (Gi)  Nausea And Vomiting and Other (See Comments)    Stomach pain  . Penicillins Other (See Comments)    Unknown reaction     MEDICATION LIST PRIOR TO VISIT: Current Meds  Medication Sig  . acetaminophen (TYLENOL) 500 MG tablet Take 500 mg by mouth daily as needed for headache (pain).  Marland Kitchen amLODipine (NORVASC) 10 MG tablet Take 10 mg by mouth daily.  Marland Kitchen atorvastatin (LIPITOR) 80 MG tablet Take 80 mg by mouth daily.  Marland Kitchen losartan (COZAAR) 50 MG tablet Take 50 mg by mouth daily.  . montelukast (SINGULAIR) 10 MG tablet Take 10 mg by mouth at bedtime.     PAST MEDICAL HISTORY: Past Medical History:  Diagnosis Date  . Acute cholecystitis 09/17/2019  . Allergies   . Asthma   . Hyperlipidemia   . Hypertension     PAST SURGICAL HISTORY: Past Surgical History:  Procedure Laterality Date  . CHOLECYSTECTOMY N/A 09/19/2019   Procedure: LAPAROSCOPIC CHOLECYSTECTOMY;  Surgeon: Ralene Ok, MD;  Location: [facility];  Service: General;  Laterality: N/A;  . GROIN DISSECTION    . INSERT / REPLACE / REMOVE PACEMAKER    . nasal polyp removal      FAMILY HISTORY: The patient family history is not on file. No family history of premature coronary disease or sudden cardiac death.  SOCIAL HISTORY:  The patient  reports that she has quit smoking. Her smoking use included cigarettes. She has a 5.00 pack-year smoking history. She has never used smokeless tobacco. She reports previous alcohol use.  REVIEW OF SYSTEMS: Review of Systems  Constitutional: Negative for chills and fever.  HENT: Negative for hoarse voice and nosebleeds.   Eyes: Negative for discharge, double vision and pain.  Cardiovascular: Negative for chest pain, claudication, dyspnea on exertion, leg swelling, near-syncope, orthopnea, palpitations, paroxysmal nocturnal dyspnea and syncope.  Respiratory: Negative for hemoptysis and shortness of breath.   Musculoskeletal: Negative for muscle cramps and myalgias.  Gastrointestinal: Negative for  abdominal pain, constipation, diarrhea, hematemesis, hematochezia, melena, nausea and vomiting.  Neurological: Negative for dizziness and light-headedness.   PHYSICAL EXAM: Vitals with BMI 03/11/2020 03/11/2020 09/23/2019  Height - '4\' 3"'  -  Weight - 137 lbs -  BMI - 16.10 -  Systolic 960 454 098  Diastolic 65 57 51  Pulse 85 83 98   CONSTITUTIONAL: Age-appropriate female, hemodynamically stable, well-developed and well-nourished. No acute distress.  SKIN: Skin is warm and dry. No rash noted. No cyanosis. No pallor. No jaundice HEAD: Normocephalic and atraumatic.  EYES: No scleral icterus MOUTH/THROAT: Moist oral membranes.  NECK: No JVD present. No thyromegaly noted. No carotid bruits  LYMPHATIC: No visible cervical adenopathy.  CHEST Normal respiratory effort. No intercostal retractions.  Pacemaker located in the left infraclavicular region.  Site is clean dry and intact. LUNGS: Clear to auscultation bilaterally.  No stridor. No wheezes. No rales.  CARDIOVASCULAR: Positive S1-S2, regular, soft holosystolic murmur heard at the apex, no gallops or rubs. ABDOMINAL: Obese, soft, nontender, nondistended, positive bowel sounds all 4 quadrants no apparent ascites.  EXTREMITIES: No peripheral edema  HEMATOLOGIC: No significant bruising NEUROLOGIC: Oriented to person, place, and time. Nonfocal. Normal muscle tone.  PSYCHIATRIC: Normal mood and affect. Normal behavior. Cooperative  CARDIAC DATABASE: EKG: 03/11/2020: Atrial sensed, ventricular paced rhythm, intraventricular conduction delay most likely secondary to left bundle branch block, without underlying injury pattern.  Echocardiogram: 09/18/2019: 1. Left ventricular ejection fraction, by estimation, is 65 to 70%. The left ventricle has normal function. The left ventricle has no regional wall motion abnormalities. Left ventricular diastolic parameters are consistent with Grade I diastolic dysfunction (impaired relaxation). Elevated left  ventricular end-diastolic pressure.  2. Right ventricular systolic function is normal. The right ventricular size is normal. There is mildly elevated pulmonary artery systolic pressure.  3. Left atrial size was moderately dilated.  4. The mitral valve is abnormal. Mild mitral valve regurgitation.  5. The aortic valve is tricuspid. Aortic valve regurgitation is not visualized.  6. The inferior vena cava is normal in size with greater than 50% respiratory variability, suggesting right atrial pressure of 3 mmHg.    Stress Testing: No results found for this or any previous visit from the past 1095 days.  Heart Catheterization: None  LABORATORY DATA: CBC Latest Ref Rng & Units 09/23/2019 09/22/2019 09/21/2019  WBC 4.0 - 10.5 K/uL 19.7(H) 22.1(H) 22.5(H)  Hemoglobin 12.0 - 15.0 g/dL 9.3(L) 9.5(L) 9.3(L)  Hematocrit 36.0 - 46.0 % 28.0(L) 28.5(L) 28.2(L)  Platelets 150 - 400 K/uL 349 328 290    CMP Latest Ref Rng & Units 09/23/2019 09/22/2019 09/21/2019  Glucose 70 - 99 mg/dL 138(H) 143(H) 146(H)  BUN 8 - 23 mg/dL 7(L) 8 8  Creatinine 0.44 - 1.00 mg/dL 0.65 0.75 0.79  Sodium 135 - 145 mmol/L 131(L) 129(L) 134(L)  Potassium 3.5 - 5.1 mmol/L 3.6 4.2 3.4(L)  Chloride 98 - 111 mmol/L 101 101 101  CO2 22 - 32 mmol/L 19(L) 20(L) 19(L)  Calcium 8.9 - 10.3 mg/dL 8.2(L) 8.6(L) 8.5(L)  Total Protein 6.5 - 8.1 g/dL 5.5(L)  5.6(L) 5.5(L)  Total Bilirubin 0.3 - 1.2 mg/dL 0.5 0.6 0.5  Alkaline Phos 38 - 126 U/L 122 103 82  AST 15 - 41 U/L '24 21 28  ' ALT 0 - 44 U/L 27 29 39    Lipid Panel  No results found for: CHOL, TRIG, HDL, CHOLHDL, VLDL, LDLCALC, LDLDIRECT, LABVLDL  No components found for: NTPROBNP No results for input(s): PROBNP in the last 8760 hours. No results for input(s): TSH in the last 8760 hours.  BMP Recent Labs    09/21/19 0630 09/22/19 0744 09/23/19 1057  NA 134* 129* 131*  K 3.4* 4.2 3.6  CL 101 101 101  CO2 19* 20* 19*  GLUCOSE 146* 143* 138*  BUN 8 8 7*  CREATININE  0.79 0.75 0.65  CALCIUM 8.5* 8.6* 8.2*  GFRNONAA >60 >60 >60  GFRAA >60 >60 >60    HEMOGLOBIN A1C No results found for: HGBA1C, MPG   External Labs: Collected: 09/15/2019 Creatinine 0.74 mg/dL. eGFR: 81 mL/min per 1.73 m Hemoglobin 10.5 g/dL. Hematocrit 32.3% Lipid profile: Total cholesterol 211, triglycerides 42, HDL 100, LDL 99  External Labs: Collected: 02/27/2020 Creatinine 1 mg/dL. eGFR: 56 mL/min per 1.73 m Hemoglobin 10.8 g/dL Lipid profile: Total cholesterol 187, triglycerides 46, HDL 98, LDL 76, non-HDL 89.  IMPRESSION:    ICD-10-CM   1. Presence of permanent cardiac pacemaker  Z95.0 EKG 12-Lead    DG Chest 2 View  2. Essential hypertension, benign  I10   3. Hypercholesteremia  E78.00   4. Hyponatremia  E87.1   5. Former smoker  Z87.891      RECOMMENDATIONS: Andreia Gandolfi is a 86 y.o. female whose past medical history and cardiac risk factors include: Presence of a permanent pacemaker, hypertension, hypercholesterolemia, former smoker, postmenopausal female, advanced age.  Presence of permanent pacemaker:  According to the pacemaker card she does have a MRI compatible dual-chamber pacemaker device implanted.  Records were requested from [facility] for transition of care visit patient has moved from [place] to [facility].  EKG shows an atrial sensed ventricular paced rhythm.  Check chest x-ray  Patient will be scheduled for a pacemaker check in the near future.  Recommendations to follow.  Benign essential hypertension:  Office blood pressure is currently not at goal.  Would recommend a systolic blood pressure less than 140 mmHg.  Patient is asked to keep a log of her blood pressures and to reduce salt intake in her diet.  Medications reconciled.  Currently managed by primary care provider.  Hypercholesterolemia: Continue statin therapy.  Most recent lipid profile independently reviewed as a part of this consultation and  noted above for further reference.  Currently managed by primary care provider.  Former smoker: Educated on the importance of continued smoking cessation.  As part of this consultation reviewed outside records that were provided by the patient's PCP, echocardiogram report performed in June 2021, outside laboratory values that are summarized above, and obtaining records and coordination of care from her prior cardiologist's office.  Total time spent: 1mnutes.  FINAL MEDICATION LIST END OF ENCOUNTER: No orders of the defined types were placed in this encounter.   There are no discontinued medications.   Current Outpatient Medications:  .  acetaminophen (TYLENOL) 500 MG tablet, Take 500 mg by mouth daily as needed for headache (pain)., Disp: , Rfl:  .  amLODipine (NORVASC) 10 MG tablet, Take 10 mg by mouth daily., Disp: , Rfl:  .  atorvastatin (LIPITOR) 80 MG tablet, Take  80 mg by mouth daily., Disp: , Rfl:  .  losartan (COZAAR) 50 MG tablet, Take 50 mg by mouth daily., Disp: , Rfl:  .  montelukast (SINGULAIR) 10 MG tablet, Take 10 mg by mouth at bedtime., Disp: , Rfl:  .  Calcium Carb-Cholecalciferol (CALCIUM 600-D PO), Take 1 tablet by mouth 2 (two) times daily., Disp: , Rfl:  .  diclofenac Sodium (VOLTAREN) 1 % GEL, Apply 1 application topically 2 (two) times daily as needed (knee pain)., Disp: , Rfl:  .  fluticasone-salmeterol (ADVAIR HFA) 115-21 MCG/ACT inhaler, Inhale 2 puffs into the lungs 2 (two) times daily as needed (shortness of breath/wheezing)., Disp: , Rfl:  .  Lactase (LACTAID PO), Take 1 tablet by mouth as needed (when consuming dairy)., Disp: , Rfl:   Orders Placed This Encounter  Procedures  . DG Chest 2 View  . EKG 12-Lead   There are no Patient Instructions on file for this visit.   --Continue cardiac medications as reconciled in final medication list. --Return in about 4 weeks (around 04/08/2020) for Hx of PPM and review outside records. . Or sooner if  needed. --Continue follow-up with your primary care physician regarding the management of your other chronic comorbid conditions.  Patient's questions and concerns were addressed to her satisfaction. She voices understanding of the instructions provided during this encounter.   This note was created using a voice recognition software as a result there may be grammatical errors inadvertently enclosed that do not reflect the nature of this encounter. Every attempt is made to correct such errors.  Rex Kras, [place], [facility]  Pager: [phone] Office: [phone]

## 2020-03-22 ENCOUNTER — Other Ambulatory Visit: Payer: Self-pay | Admitting: Cardiology

## 2020-03-22 ENCOUNTER — Ambulatory Visit
Admission: RE | Admit: 2020-03-22 | Discharge: 2020-03-22 | Disposition: A | Discharge status: Home / Self Care | Payer: PRIVATE HEALTH INSURANCE | Source: Ambulatory Visit | Attending: Cardiology | Admitting: Cardiology

## 2020-03-22 DIAGNOSIS — Z950 Presence of cardiac pacemaker: Secondary | ICD-10-CM | POA: Diagnosis not present

## 2020-03-22 DIAGNOSIS — I700 Atherosclerosis of aorta: Secondary | ICD-10-CM | POA: Diagnosis not present

## 2020-03-24 ENCOUNTER — Encounter: Payer: Self-pay | Admitting: Cardiology

## 2020-04-07 NOTE — Progress Notes (Signed)
ID:  Sarah Burns, DOB 1924/12/25, MRN [id number]  PCP:  Lucianne Lei, MD  Cardiologist:  Rex Kras, DO, [facility] (established care 03/11/2020) Former Cardiology Providers: [facility] in [place]  Date: 04/08/20 Last Office Visit: 03/11/2020  Chief Complaint  Patient presents with  . Presence of permanent cardiac pacemaker  . Follow-up    HPI  Sarah Burns is a 86 y.o. female who presents to the office with a chief complaint of " follow up for pacemake check." Patient's past medical history and cardiovascular risk factors include: Presence of a permanent pacemaker, hypertension, hypercholesterolemia, former smoker, postmenopausal female, advanced age.  She is referred to the office at the request of Lucianne Lei, MD for evaluation of heart disease.  Patient is accompanied by her daughter Shameika Speelman at today's office visit.   She provides verbal consent in regards to discussing her medical history and plan of care in the presence of her daughter.  Patient states that she recently moved from [place] to [place] back in April 2021 to be closer to her daughter.  She states that prior to leaving [place] she had her pacemaker checked and since then has not followed up with cardiology.  She is here to establish care and have her pacemaker checked.  Patient states that she used to have her pacemaker checked every 6 months.  She does not remember the indication of why the device was placed but remembers that it is a [employer] device.  Based on the pacemaker card the [employer] device was implanted on January 29, 2014 and it is a Advisa DR MRI SureScan Pacemaker.  She is getting her care at [facility].  The last office visit the plan was to obtain outside records from [facility] to understand the state of her cardiac health. However, no records are received.  In addition, they have not transferred her device follow up care to our practice because they are awaiting  patient consent. Therefore, she has not had a device check in the interim. She did have a chest xray since the last visit that was reviewed with the patient at today's visit.   Otherwise patient does not have any symptoms of angina pectoris or heart failure.  ALLERGIES: Allergies  Allergen Reactions  . Lactose Intolerance (Gi) Nausea And Vomiting and Other (See Comments)    Stomach pain  . Penicillins Other (See Comments)    Unknown reaction     MEDICATION LIST PRIOR TO VISIT: Current Meds  Medication Sig  . acetaminophen (TYLENOL) 500 MG tablet Take 500 mg by mouth daily as needed for headache (pain).  Marland Kitchen amLODipine (NORVASC) 10 MG tablet Take 10 mg by mouth daily.  Marland Kitchen aspirin EC 81 MG tablet Take 1 tablet (81 mg total) by mouth daily. Swallow whole.  Marland Kitchen atorvastatin (LIPITOR) 80 MG tablet Take 80 mg by mouth daily.  . Calcium Carb-Cholecalciferol (CALCIUM 600-D PO) Take 1 tablet by mouth 2 (two) times daily.  . diclofenac Sodium (VOLTAREN) 1 % GEL Apply 1 application topically 2 (two) times daily as needed (knee pain).  . fluticasone-salmeterol (ADVAIR HFA) 115-21 MCG/ACT inhaler Inhale 2 puffs into the lungs 2 (two) times daily as needed (shortness of breath/wheezing).  . Lactase (LACTAID PO) Take 1 tablet by mouth as needed (when consuming dairy).  Marland Kitchen losartan (COZAAR) 50 MG tablet Take 50 mg by mouth daily.  . montelukast (SINGULAIR) 10 MG tablet Take 10 mg by mouth at bedtime.     PAST MEDICAL HISTORY: Past  Medical History:  Diagnosis Date  . Acute cholecystitis 09/17/2019  . Allergies   . Asthma   . Hyperlipidemia   . Hypertension     PAST SURGICAL HISTORY: Past Surgical History:  Procedure Laterality Date  . CHOLECYSTECTOMY N/A 09/19/2019   Procedure: LAPAROSCOPIC CHOLECYSTECTOMY;  Surgeon: Ralene Ok, MD;  Location: [place];  Service: General;  Laterality: N/A;  . GROIN DISSECTION    . INSERT / REPLACE / REMOVE PACEMAKER    . nasal polyp removal      FAMILY  HISTORY: The patient family history is not on file. No family history of premature coronary disease or sudden cardiac death.  SOCIAL HISTORY:  The patient  reports that she has quit smoking. Her smoking use included cigarettes. She has a 5.00 pack-year smoking history. She has never used smokeless tobacco. She reports previous alcohol use.  REVIEW OF SYSTEMS: Review of Systems  Constitutional: Negative for chills and fever.  HENT: Negative for hoarse voice and nosebleeds.   Eyes: Negative for discharge, double vision and pain.  Cardiovascular: Negative for chest pain, claudication, dyspnea on exertion, leg swelling, near-syncope, orthopnea, palpitations, paroxysmal nocturnal dyspnea and syncope.  Respiratory: Positive for shortness of breath and wheezing. Negative for hemoptysis.   Musculoskeletal: Negative for muscle cramps and myalgias.  Gastrointestinal: Negative for abdominal pain, constipation, diarrhea, hematemesis, hematochezia, melena, nausea and vomiting.  Neurological: Negative for dizziness and light-headedness.   PHYSICAL EXAM: Vitals with BMI 04/08/2020 03/11/2020 03/11/2020  Height $Remov'4\' 3"'FzTCza$  - $'4\' 3"'t$   Weight 136 lbs - 137 lbs  BMI 59.16 - 38.46  Systolic 659 935 701  Diastolic 58 65 57  Pulse 85 85 83   CONSTITUTIONAL: Age-appropriate female, hemodynamically stable, well-developed and well-nourished. No acute distress.  SKIN: Skin is warm and dry. No rash noted. No cyanosis. No pallor. No jaundice HEAD: Normocephalic and atraumatic.  EYES: No scleral icterus MOUTH/THROAT: Moist oral membranes.  NECK: No JVD present. No thyromegaly noted. No carotid bruits  LYMPHATIC: No visible cervical adenopathy.  CHEST Normal respiratory effort. No intercostal retractions.  Pacemaker located in the left infraclavicular region.  Site is clean dry and intact. LUNGS: Clear to auscultation bilaterally.  No stridor. No wheezes. No rales.  CARDIOVASCULAR: Positive S1-S2, regular, soft  holosystolic murmur heard at the apex, no gallops or rubs. ABDOMINAL: Obese, soft, nontender, nondistended, positive bowel sounds all 4 quadrants no apparent ascites.  EXTREMITIES: No peripheral edema  HEMATOLOGIC: No significant bruising NEUROLOGIC: Oriented to person, place, and time. Nonfocal. Normal muscle tone.  PSYCHIATRIC: Normal mood and affect. Normal behavior. Cooperative  RADIOLOGY: Chest x-ray 03/22/2020: Left pacer in place as described above. No acute cardiopulmonary disease. Bibasilar scarring.  Aortic atherosclerosis.  CARDIAC DATABASE: EKG: 03/11/2020: Atrial sensed, ventricular paced rhythm, intraventricular conduction delay most likely secondary to left bundle branch block, without underlying injury pattern.  Echocardiogram: 09/18/2019: 1. Left ventricular ejection fraction, by estimation, is 65 to 70%. The left ventricle has normal function. The left ventricle has no regional wall motion abnormalities. Left ventricular diastolic parameters are consistent with Grade I diastolic dysfunction (impaired relaxation). Elevated left ventricular end-diastolic pressure.  2. Right ventricular systolic function is normal. The right ventricular size is normal. There is mildly elevated pulmonary artery systolic pressure.  3. Left atrial size was moderately dilated.  4. The mitral valve is abnormal. Mild mitral valve regurgitation.  5. The aortic valve is tricuspid. Aortic valve regurgitation is not visualized.  6. The inferior vena cava is normal in size with greater  than 50% respiratory variability, suggesting right atrial pressure of 3 mmHg.    Stress Testing: No results found for this or any previous visit from the past 1095 days.  Heart Catheterization: None  LABORATORY DATA: CBC Latest Ref Rng & Units 09/23/2019 09/22/2019 09/21/2019  WBC 4.0 - 10.5 K/uL 19.7(H) 22.1(H) 22.5(H)  Hemoglobin 12.0 - 15.0 g/dL 9.3(L) 9.5(L) 9.3(L)  Hematocrit 36.0 - 46.0 % 28.0(L)  28.5(L) 28.2(L)  Platelets 150 - 400 K/uL 349 328 290    CMP Latest Ref Rng & Units 09/23/2019 09/22/2019 09/21/2019  Glucose 70 - 99 mg/dL 138(H) 143(H) 146(H)  BUN 8 - 23 mg/dL 7(L) 8 8  Creatinine 0.44 - 1.00 mg/dL 0.65 0.75 0.79  Sodium 135 - 145 mmol/L 131(L) 129(L) 134(L)  Potassium 3.5 - 5.1 mmol/L 3.6 4.2 3.4(L)  Chloride 98 - 111 mmol/L 101 101 101  CO2 22 - 32 mmol/L 19(L) 20(L) 19(L)  Calcium 8.9 - 10.3 mg/dL 8.2(L) 8.6(L) 8.5(L)  Total Protein 6.5 - 8.1 g/dL 5.5(L) 5.6(L) 5.5(L)  Total Bilirubin 0.3 - 1.2 mg/dL 0.5 0.6 0.5  Alkaline Phos 38 - 126 U/L 122 103 82  AST 15 - 41 U/L _0 ALT 0 - 44 U/L 27 29 39    Lipid Panel  No results found for: CHOL, TRIG, HDL, CHOLHDL, VLDL, LDLCALC, LDLDIRECT, LABVLDL  No components found for: NTPROBNP No results for input(s): PROBNP in the last 8760 hours. No results for input(s): TSH in the last 8760 hours.  BMP Recent Labs    09/21/19 0630 09/22/19 0744 09/23/19 1057  NA 134* 129* 131*  K 3.4* 4.2 3.6  CL 101 101 101  CO2 19* 20* 19*  GLUCOSE 146* 143* 138*  BUN 8 8 7*  CREATININE 0.79 0.75 0.65  CALCIUM 8.5* 8.6* 8.2*  GFRNONAA >60 >60 >60  GFRAA >60 >60 >60    HEMOGLOBIN A1C No results found for: HGBA1C, MPG   External Labs: Collected: 09/15/2019 Creatinine 0.74 mg/dL. eGFR: 81 mL/min per 1.73 m Hemoglobin 10.5 g/dL. Hematocrit 32.3% Lipid profile: Total cholesterol 211, triglycerides 42, HDL 100, LDL 99  External Labs: Collected: 02/27/2020 Creatinine 1 mg/dL. eGFR: 56 mL/min per 1.73 m Hemoglobin 10.8 g/dL Lipid profile: Total cholesterol 187, triglycerides 46, HDL 98, LDL 76, non-HDL 89.  IMPRESSION:    ICD-10-CM   1. Presence of permanent cardiac pacemaker  Z95.0   2. Essential hypertension, benign  I10   3. Hypercholesteremia  E78.00   4. Hyponatremia  E87.1   5. Former smoker  Z87.891   41. Atherosclerosis of aorta (HCC)  I70.0 aspirin EC 81 MG tablet  7. Shortness of breath  R06.02  Hemoglobin and hematocrit, blood    Pro b natriuretic peptide (BNP)    Basic metabolic panel     RECOMMENDATIONS: Renaye Janicki is a 86 y.o. female whose past medical history and cardiac risk factors include: Presence of a permanent pacemaker, hypertension, hypercholesterolemia, former smoker, postmenopausal female, advanced age.  Presence of permanent pacemaker:  According to the pacemaker card she does have a MRI compatible dual-chamber pacemaker device implanted.  Records were requested from [facility] for transition of care visit patient has moved from [place] to [facility].  Reviewed results of chest x-ray.  The office has reached her multiple times to [facility] for transition of care in regards to her pacemaker management.  However, the care has not been transitioned over and therefore the device has not been checked.  Both  the patient's daughter and the office will try to recontact [facility] to transfer her pacemaker care to [place].  Patient will be scheduled for a pacemaker check in the near future.  Recommendations to follow.  Shortness of breath:   Check BMP, BNP, and H&H  Reviewed the results of the last echocardiogram.   Awaiting results of her last stress test.   Aortic atherosclerosis:  Noted on chest x-ray dated 03/22/2020.  We will start aspirin 81 mg p.o. daily.  Continue statin therapy.  Benign essential hypertension:  Office blood pressure is currently not at goal.  Would recommend a systolic blood pressure less than 140 mmHg.  Patient is asked to keep a log of her blood pressures and to reduce salt intake in her diet.  Medications reconciled.  Currently managed by primary care provider.  Hypercholesterolemia: Continue statin therapy.  Most recent lipid profile independently reviewed as a part of this consultation and noted above for further reference.  Currently managed by primary care provider.  Former smoker:  Educated on the importance of continued smoking cessation.  We will rerequest records from [facility] in regards to obtaining the last office note, echocardiogram results, stress test, left heart catheterization report, and last pacemaker interrogation.  Total time spent: 20 minutes.  FINAL MEDICATION LIST END OF ENCOUNTER: Meds ordered this encounter  Medications  . aspirin EC 81 MG tablet    Sig: Take 1 tablet (81 mg total) by mouth daily. Swallow whole.    Dispense:  30 tablet    Refill:  11    There are no discontinued medications.   Current Outpatient Medications:  .  acetaminophen (TYLENOL) 500 MG tablet, Take 500 mg by mouth daily as needed for headache (pain)., Disp: , Rfl:  .  amLODipine (NORVASC) 10 MG tablet, Take 10 mg by mouth daily., Disp: , Rfl:  .  aspirin EC 81 MG tablet, Take 1 tablet (81 mg total) by mouth daily. Swallow whole., Disp: 30 tablet, Rfl: 11 .  atorvastatin (LIPITOR) 80 MG tablet, Take 80 mg by mouth daily., Disp: , Rfl:  .  Calcium Carb-Cholecalciferol (CALCIUM 600-D PO), Take 1 tablet by mouth 2 (two) times daily., Disp: , Rfl:  .  diclofenac Sodium (VOLTAREN) 1 % GEL, Apply 1 application topically 2 (two) times daily as needed (knee pain)., Disp: , Rfl:  .  fluticasone-salmeterol (ADVAIR HFA) 115-21 MCG/ACT inhaler, Inhale 2 puffs into the lungs 2 (two) times daily as needed (shortness of breath/wheezing)., Disp: , Rfl:  .  Lactase (LACTAID PO), Take 1 tablet by mouth as needed (when consuming dairy)., Disp: , Rfl:  .  losartan (COZAAR) 50 MG tablet, Take 50 mg by mouth daily., Disp: , Rfl:  .  montelukast (SINGULAIR) 10 MG tablet, Take 10 mg by mouth at bedtime., Disp: , Rfl:   Orders Placed This Encounter  Procedures  . Hemoglobin and hematocrit, blood  . Pro b natriuretic peptide (BNP)  . Basic metabolic panel   There are no Patient Instructions on file for this visit.   --Continue cardiac medications as reconciled in final medication  list. --Return in about 4 weeks (around 05/06/2020) for Reevaluation of pacemaker results and shortness of breath. Or sooner if needed. --Continue follow-up with your primary care physician regarding the management of your other chronic comorbid conditions.  Patient's questions and concerns were addressed to her satisfaction. She voices understanding of the instructions provided during this encounter.   This note was created using a voice recognition  software as a result there may be grammatical errors inadvertently enclosed that do not reflect the nature of this encounter. Every attempt is made to correct such errors.  Rex Kras, [place], [facility]  Pager: [phone] Office: [phone]

## 2020-04-08 ENCOUNTER — Other Ambulatory Visit: Payer: Self-pay

## 2020-04-08 ENCOUNTER — Telehealth: Payer: Self-pay

## 2020-04-08 ENCOUNTER — Ambulatory Visit: Payer: Medicare HMO | Admitting: Cardiology

## 2020-04-08 ENCOUNTER — Encounter: Payer: Self-pay | Admitting: Cardiology

## 2020-04-08 VITALS — BP 151/58 | HR 85 | Resp 16 | Ht <= 58 in | Wt 136.0 lb

## 2020-04-08 DIAGNOSIS — I700 Atherosclerosis of aorta: Secondary | ICD-10-CM

## 2020-04-08 DIAGNOSIS — E871 Hypo-osmolality and hyponatremia: Secondary | ICD-10-CM

## 2020-04-08 DIAGNOSIS — E7800 Pure hypercholesterolemia, unspecified: Secondary | ICD-10-CM

## 2020-04-08 DIAGNOSIS — I10 Essential (primary) hypertension: Secondary | ICD-10-CM | POA: Diagnosis not present

## 2020-04-08 DIAGNOSIS — Z950 Presence of cardiac pacemaker: Secondary | ICD-10-CM | POA: Diagnosis not present

## 2020-04-08 DIAGNOSIS — Z87891 Personal history of nicotine dependence: Secondary | ICD-10-CM

## 2020-04-08 DIAGNOSIS — R0602 Shortness of breath: Secondary | ICD-10-CM

## 2020-04-08 MED ORDER — ASPIRIN EC 81 MG PO TBEC
81.0000 mg | DELAYED_RELEASE_TABLET | Freq: Every day | ORAL | 11 refills | Status: DC
Start: 2020-04-08 — End: 2020-08-07

## 2020-04-08 NOTE — Telephone Encounter (Signed)
Please reach out again today or tomorrow.

## 2020-04-08 NOTE — Telephone Encounter (Signed)
Patient stated that she is due for check, last check was March, per patient she is to have it checked every 6 months. I have checked the portal from [facility] in [place]. and they still have not transferred patient to our clinic. I have called 3 times and left several messages to call back, no response, no callback. I will let patient know to call them as well to ask them to transfer her in, since she still needs to give verbal permission to transfer, plus she may have better luck getting in contact with someone.

## 2020-04-16 NOTE — Telephone Encounter (Signed)
Patient daughter stated that during the move from [place] to [place], she lost the home monitor and that's why she has not been doing remote transmissions. I advised her to contact [employer] to have them send her a new one. She said they will call today.

## 2020-04-16 NOTE — Telephone Encounter (Signed)
Please follow up with either Ms. Griffy or her daughter next week regarding this and thanks for the update.

## 2020-04-18 NOTE — Telephone Encounter (Signed)
Records came in for this patient, but it is 100 pages. Do you want me to email it you?

## 2020-05-07 ENCOUNTER — Ambulatory Visit: Payer: PRIVATE HEALTH INSURANCE | Admitting: Cardiology

## 2020-05-13 DIAGNOSIS — I10 Essential (primary) hypertension: Secondary | ICD-10-CM | POA: Diagnosis not present

## 2020-05-13 DIAGNOSIS — E785 Hyperlipidemia, unspecified: Secondary | ICD-10-CM | POA: Diagnosis not present

## 2020-05-13 DIAGNOSIS — M130 Polyarthritis, unspecified: Secondary | ICD-10-CM | POA: Diagnosis not present

## 2020-05-13 DIAGNOSIS — R059 Cough, unspecified: Secondary | ICD-10-CM | POA: Diagnosis not present

## 2020-05-13 DIAGNOSIS — R509 Fever, unspecified: Secondary | ICD-10-CM | POA: Diagnosis not present

## 2020-05-14 DIAGNOSIS — Z20822 Contact with and (suspected) exposure to covid-19: Secondary | ICD-10-CM | POA: Diagnosis not present

## 2020-05-21 ENCOUNTER — Other Ambulatory Visit: Payer: Self-pay

## 2020-05-21 ENCOUNTER — Encounter: Payer: Self-pay | Admitting: Cardiology

## 2020-05-21 ENCOUNTER — Ambulatory Visit: Payer: Medicare HMO | Admitting: Cardiology

## 2020-05-21 VITALS — BP 140/70 | HR 77 | Temp 97.5°F | Resp 16 | Ht <= 58 in | Wt 139.6 lb

## 2020-05-21 DIAGNOSIS — Z87891 Personal history of nicotine dependence: Secondary | ICD-10-CM

## 2020-05-21 DIAGNOSIS — Z950 Presence of cardiac pacemaker: Secondary | ICD-10-CM

## 2020-05-21 DIAGNOSIS — R0602 Shortness of breath: Secondary | ICD-10-CM | POA: Diagnosis not present

## 2020-05-21 DIAGNOSIS — E871 Hypo-osmolality and hyponatremia: Secondary | ICD-10-CM

## 2020-05-21 DIAGNOSIS — I700 Atherosclerosis of aorta: Secondary | ICD-10-CM

## 2020-05-21 DIAGNOSIS — R0989 Other specified symptoms and signs involving the circulatory and respiratory systems: Secondary | ICD-10-CM | POA: Diagnosis not present

## 2020-05-21 DIAGNOSIS — I10 Essential (primary) hypertension: Secondary | ICD-10-CM

## 2020-05-21 DIAGNOSIS — E7800 Pure hypercholesterolemia, unspecified: Secondary | ICD-10-CM | POA: Diagnosis not present

## 2020-05-21 NOTE — Progress Notes (Addendum)
ID:  Sarah Burns, DOB 11-06-1924, MRN [id number]  PCP:  Lucianne Lei, MD  Cardiologist:  Rex Kras, DO, [facility] (established care 03/11/2020) Former Cardiology Providers: Dr. Anders Simmonds at [facility]  Date: 05/21/20 Last Office Visit: 04/08/2020  Chief Complaint  Patient presents with  . Presence of permanent cardiac pacemaker  . Follow-up    HPI  Sarah Burns is a 87 y.o. female who presents to the office with a chief complaint of " follow up for pacemake check." Patient's past medical history and cardiovascular risk factors include: Presence of a permanent pacemaker, hypertension, hypercholesterolemia, former smoker, postmenopausal female, advanced age.  She is referred to the office at the request of Lucianne Lei, MD for evaluation of heart disease.  Patient is accompanied by her daughter Sarah Burns at today's office visit.   She provides verbal consent in regards to discussing her medical history and plan of care in the presence of her daughter.  Patient states that she recently moved from [place] to [place] back in April 2021 to be closer to her daughter.  Based on the pacemaker card the Medtronic device was implanted on January 29, 2014 and it is a Advisa DR MRI SureScan Pacemaker.  Based on the records received from [facility] her last pacemaker check was in July 2020.  Per report normal pacemaker function with 100% RV pacing.  She was recommended to follow-up in December 2020 for another pacemaker check but she failed to do so.  Patient denies any chest pain at rest or with effort late activities.  She has chronic shortness of breath which has not increased in intensity, frequency, duration.  Patient states that her symptoms are most likely secondary to her underlying asthma.  ALLERGIES: Allergies  Allergen Reactions  . Lactose Intolerance (Gi) Nausea And Vomiting and Other (See Comments)    Stomach pain  . Penicillins Other (See Comments)    Unknown  reaction     MEDICATION LIST PRIOR TO VISIT: Current Meds  Medication Sig  . acetaminophen (TYLENOL) 500 MG tablet Take 500 mg by mouth daily as needed for headache (pain).  Marland Kitchen amLODipine (NORVASC) 10 MG tablet Take 10 mg by mouth daily.  Marland Kitchen aspirin EC 81 MG tablet Take 1 tablet (81 mg total) by mouth daily. Swallow whole.  Marland Kitchen atorvastatin (LIPITOR) 80 MG tablet Take 80 mg by mouth daily.  . Calcium Carb-Cholecalciferol (CALCIUM 600-D PO) Take 1 tablet by mouth 2 (two) times daily.  . diclofenac Sodium (VOLTAREN) 1 % GEL Apply 1 application topically 2 (two) times daily as needed (knee pain).  . fluticasone-salmeterol (ADVAIR HFA) 115-21 MCG/ACT inhaler Inhale 2 puffs into the lungs 2 (two) times daily as needed (shortness of breath/wheezing).  . Lactase (LACTAID PO) Take 1 tablet by mouth as needed (when consuming dairy).  Marland Kitchen losartan (COZAAR) 50 MG tablet Take 50 mg by mouth daily.  . montelukast (SINGULAIR) 10 MG tablet Take 10 mg by mouth at bedtime.     PAST MEDICAL HISTORY: Past Medical History:  Diagnosis Date  . Acute cholecystitis 09/17/2019  . Allergies   . Asthma   . Hyperlipidemia   . Hypertension     PAST SURGICAL HISTORY: Past Surgical History:  Procedure Laterality Date  . CHOLECYSTECTOMY N/A 09/19/2019   Procedure: LAPAROSCOPIC CHOLECYSTECTOMY;  Surgeon: Ralene Ok, MD;  Location: [facility];  Service: General;  Laterality: N/A;  . GROIN DISSECTION    . INSERT / REPLACE / REMOVE PACEMAKER    . nasal  polyp removal      FAMILY HISTORY: The patient family history is not on file. No family history of premature coronary disease or sudden cardiac death.  SOCIAL HISTORY:  The patient  reports that she has quit smoking. Her smoking use included cigarettes. She has a 5.00 pack-year smoking history. She has never used smokeless tobacco. She reports previous alcohol use.  REVIEW OF SYSTEMS: Review of Systems  Constitutional: Negative for chills and fever.  HENT:  Negative for hoarse voice and nosebleeds.   Eyes: Negative for discharge, double vision and pain.  Cardiovascular: Negative for chest pain, claudication, dyspnea on exertion, leg swelling, near-syncope, orthopnea, palpitations, paroxysmal nocturnal dyspnea and syncope.  Respiratory: Positive for shortness of breath (Chronic and stable). Negative for hemoptysis and wheezing.   Musculoskeletal: Negative for muscle cramps and myalgias.  Gastrointestinal: Negative for abdominal pain, constipation, diarrhea, hematemesis, hematochezia, melena, nausea and vomiting.  Neurological: Negative for dizziness and light-headedness.   PHYSICAL EXAM: Vitals with BMI 05/21/2020 04/08/2020 03/11/2020  Height '4\' 3"'  '4\' 3"'  -  Weight 139 lbs 10 oz 136 lbs -  BMI 78.93 81.01 -  Systolic 751 025 852  Diastolic 70 58 65  Pulse 77 85 85   CONSTITUTIONAL: Age-appropriate female, hemodynamically stable, well-developed and well-nourished. No acute distress.  SKIN: Skin is warm and dry. No rash noted. No cyanosis. No pallor. No jaundice HEAD: Normocephalic and atraumatic.  EYES: No scleral icterus MOUTH/THROAT: Moist oral membranes.  NECK: No JVD present. No thyromegaly noted. Right carotid bruits  LYMPHATIC: No visible cervical adenopathy.  CHEST Normal respiratory effort. No intercostal retractions.  Pacemaker located in the left infraclavicular region.  Site is clean dry and intact. LUNGS: Clear to auscultation bilaterally.  No stridor. No wheezes. No rales.  CARDIOVASCULAR: Positive S1-S2, regular, soft holosystolic murmur heard at the apex, no gallops or rubs. ABDOMINAL: Obese, soft, nontender, nondistended, positive bowel sounds all 4 quadrants no apparent ascites.  EXTREMITIES: No peripheral edema  HEMATOLOGIC: No significant bruising NEUROLOGIC: Oriented to person, place, and time. Nonfocal. Normal muscle tone.  PSYCHIATRIC: Normal mood and affect. Normal behavior. Cooperative  RADIOLOGY: Chest x-ray  03/22/2020: Left pacer in place as described above. No acute cardiopulmonary disease. Bibasilar scarring.  Aortic atherosclerosis.  CARDIAC DATABASE: EKG: 03/11/2020: Atrial sensed, ventricular paced rhythm, intraventricular conduction delay most likely secondary to left bundle branch block, without underlying injury pattern.  Echocardiogram: 09/18/2019: 1. Left ventricular ejection fraction, by estimation, is 65 to 70%. The left ventricle has normal function. The left ventricle has no regional wall motion abnormalities. Left ventricular diastolic parameters are consistent with Grade I diastolic dysfunction (impaired relaxation). Elevated left ventricular end-diastolic pressure.  2. Right ventricular systolic function is normal. The right ventricular size is normal. There is mildly elevated pulmonary artery systolic pressure.  3. Left atrial size was moderately dilated.  4. The mitral valve is abnormal. Mild mitral valve regurgitation.  5. The aortic valve is tricuspid. Aortic valve regurgitation is not visualized.  6. The inferior vena cava is normal in size with greater than 50% respiratory variability, suggesting right atrial pressure of 3 mmHg.    Stress Testing: No results found for this or any previous visit from the past 1095 days.  Heart Catheterization: None  LABORATORY DATA: CBC Latest Ref Rng & Units 09/23/2019 09/22/2019 09/21/2019  WBC 4.0 - 10.5 K/uL 19.7(H) 22.1(H) 22.5(H)  Hemoglobin 12.0 - 15.0 g/dL 9.3(L) 9.5(L) 9.3(L)  Hematocrit 36.0 - 46.0 % 28.0(L) 28.5(L) 28.2(L)  Platelets 150 - 400  K/uL 349 328 290    CMP Latest Ref Rng & Units 09/23/2019 09/22/2019 09/21/2019  Glucose 70 - 99 mg/dL 138(H) 143(H) 146(H)  BUN 8 - 23 mg/dL 7(L) 8 8  Creatinine 0.44 - 1.00 mg/dL 0.65 0.75 0.79  Sodium 135 - 145 mmol/L 131(L) 129(L) 134(L)  Potassium 3.5 - 5.1 mmol/L 3.6 4.2 3.4(L)  Chloride 98 - 111 mmol/L 101 101 101  CO2 22 - 32 mmol/L 19(L) 20(L) 19(L)  Calcium 8.9 -  10.3 mg/dL 8.2(L) 8.6(L) 8.5(L)  Total Protein 6.5 - 8.1 g/dL 5.5(L) 5.6(L) 5.5(L)  Total Bilirubin 0.3 - 1.2 mg/dL 0.5 0.6 0.5  Alkaline Phos 38 - 126 U/L 122 103 82  AST 15 - 41 U/L '24 21 28  ' ALT 0 - 44 U/L 27 29 39    Lipid Panel  No results found for: CHOL, TRIG, HDL, CHOLHDL, VLDL, LDLCALC, LDLDIRECT, LABVLDL  No components found for: NTPROBNP No results for input(s): PROBNP in the last 8760 hours. No results for input(s): TSH in the last 8760 hours.  BMP Recent Labs    09/21/19 0630 09/22/19 0744 09/23/19 1057  NA 134* 129* 131*  K 3.4* 4.2 3.6  CL 101 101 101  CO2 19* 20* 19*  GLUCOSE 146* 143* 138*  BUN 8 8 7*  CREATININE 0.79 0.75 0.65  CALCIUM 8.5* 8.6* 8.2*  GFRNONAA >60 >60 >60  GFRAA >60 >60 >60    HEMOGLOBIN A1C No results found for: HGBA1C, MPG   External Labs: Collected: 09/15/2019 Creatinine 0.74 mg/dL. eGFR: 81 mL/min per 1.73 m Hemoglobin 10.5 g/dL. Hematocrit 32.3% Lipid profile: Total cholesterol 211, triglycerides 42, HDL 100, LDL 99  External Labs: Collected: 02/27/2020 Creatinine 1 mg/dL. eGFR: 56 mL/min per 1.73 m Hemoglobin 10.8 g/dL Lipid profile: Total cholesterol 187, triglycerides 46, HDL 98, LDL 76, non-HDL 89.  IMPRESSION:    ICD-10-CM   1. Shortness of breath  R06.02   2. Right carotid bruit  R09.89   3. Presence of permanent cardiac pacemaker  Z95.0   4. Essential hypertension, benign  I10   5. Hypercholesteremia  E78.00   6. Hyponatremia  E87.1   7. Former smoker  Z87.891   21. Atherosclerosis of aorta (HCC)  I70.0      RECOMMENDATIONS: Sarah Burns is a 87 y.o. female whose past medical history and cardiac risk factors include: Presence of a permanent pacemaker, hypertension, hypercholesterolemia, former smoker, postmenopausal female, advanced age.  Presence of permanent pacemaker:  Received records from [facility] which were reviewed prior to today's encounter.  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  Based on the most recent pacemaker report that is available she is 100% RV paced.    Patient will be scheduled for an office pacemaker check next week.    Since last visit they have been on hold of a new bedside transmitter as well.    Right carotid bruit: Currently on aspirin and statin therapy. We will check carotid duplex.  Shortness of breath: Chronic and stable  Had ordered blood work during last office visit but patient forgot to have them performed.  However, daughter states that PCP just did them 1 week ago. Will obtain outside records.   Patient states that her symptoms are most likely secondary to her underlying asthma.  Reviewed the results of the last echocardiogram.   Based on the records received no recent stress test have been performed.  This  is a 87 year old African-American female who is very functional for her age and therefore we discussed an ischemic evaluation.  However, both the patient and her daughter would like to hold off on stress testing at this time as they are attributing her symptoms to underlying asthma.  Aortic atherosclerosis: Continue aspirin and statin therapy.  Benign essential hypertension:  Office blood pressure within acceptable range.   Medications reconciled.  Currently managed by primary care provider.  Hypercholesterolemia: Continue statin therapy. Currently managed by primary care provider.  Former smoker: Educated on the importance of continued smoking cessation.  Total encounter time 34 minutes. *Total Encounter Time as defined by the Centers for Medicare and Medicaid Services includes, in addition to the face-to-face time of a patient visit (documented in the note above) non-face-to-face time: obtaining and reviewing outside history, ordering and reviewing medications, tests or procedures, care coordination (communications with other health care  professionals or caregivers) and documentation in the medical record.  FINAL MEDICATION LIST END OF ENCOUNTER: No orders of the defined types were placed in this encounter.   There are no discontinued medications.   Current Outpatient Medications:  .  acetaminophen (TYLENOL) 500 MG tablet, Take 500 mg by mouth daily as needed for headache (pain)., Disp: , Rfl:  .  amLODipine (NORVASC) 10 MG tablet, Take 10 mg by mouth daily., Disp: , Rfl:  .  aspirin EC 81 MG tablet, Take 1 tablet (81 mg total) by mouth daily. Swallow whole., Disp: 30 tablet, Rfl: 11 .  atorvastatin (LIPITOR) 80 MG tablet, Take 80 mg by mouth daily., Disp: , Rfl:  .  Calcium Carb-Cholecalciferol (CALCIUM 600-D PO), Take 1 tablet by mouth 2 (two) times daily., Disp: , Rfl:  .  diclofenac Sodium (VOLTAREN) 1 % GEL, Apply 1 application topically 2 (two) times daily as needed (knee pain)., Disp: , Rfl:  .  fluticasone-salmeterol (ADVAIR HFA) 115-21 MCG/ACT inhaler, Inhale 2 puffs into the lungs 2 (two) times daily as needed (shortness of breath/wheezing)., Disp: , Rfl:  .  Lactase (LACTAID PO), Take 1 tablet by mouth as needed (when consuming dairy)., Disp: , Rfl:  .  losartan (COZAAR) 50 MG tablet, Take 50 mg by mouth daily., Disp: , Rfl:  .  montelukast (SINGULAIR) 10 MG tablet, Take 10 mg by mouth at bedtime., Disp: , Rfl:   No orders of the defined types were placed in this encounter.  There are no Patient Instructions on file for this visit.   --Continue cardiac medications as reconciled in final medication list. --Return in about 3 months (around 08/18/2020) for Follow up pacemaker and dyspnea and carotid duplex. . Or sooner if needed. --Continue follow-up with your primary care physician regarding the management of your other chronic comorbid conditions.  Patient's questions and concerns were addressed to her satisfaction. She voices understanding of the instructions provided during this encounter.   This note was  created using a voice recognition software as a result there may be grammatical errors inadvertently enclosed that do not reflect the nature of this encounter. Every attempt is made to correct such errors.  Mechele Claude [facility]  Pager: [phone] Office: [phone]  External Labs: Collected: 05/13/2020 labs received from her PCPs office after the appointment. Creatinine 0.95 mg/dL. eGFR: 60 mL/min per 1.73 m AST 20, ALT 16, alkaline phosphatase 76 Hemoglobin 10.6 g/dL, hematocrit 32.4% Sodium 132, potassium 4.4, chloride 100, bicarb 26 Lipid profile: Total cholesterol 184, triglycerides 67, HDL 87, LDL 83, non-HDL 97  Trentan Trippe, DO,  [facility]  Pager: [phone] Office: [phone] 4:45 PM, 05/21/20

## 2020-05-30 ENCOUNTER — Encounter: Payer: PRIVATE HEALTH INSURANCE | Admitting: Cardiology

## 2020-05-30 NOTE — Progress Notes (Deleted)
Scheduled  In office pacemaker check 05/30/20  Single (S)/Dual (D)/BV: ***. Presenting ***. Pacemaker dependant:  ***. Underlying ***. AP ***%, VP ***%. BP ***%. AMS Episodes ***.  AT/AF burden ***% . Longest ***. Latest ***. HVR ***. Longest ***. Latest ***. Longevity *** Years. Magnet rate: >85%. Lead measurements: Stable. Thoracic impedance: ***. Histogram: Low (L)/normal (N)/high (H)  ***. Patient activity ***.   Observations: ***. Changes: ***.     ICD-10-CM   1. Encounter for care of pacemaker  Z45.018   2. 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 Presence of permanent cardiac pacemaker  Z95.0     Yates Decamp, MD, [facility] 05/30/2020, 1:26 PM Office: [phone] Pager: [phone]

## 2020-06-04 DIAGNOSIS — J4520 Mild intermittent asthma, uncomplicated: Secondary | ICD-10-CM | POA: Diagnosis not present

## 2020-06-04 DIAGNOSIS — I10 Essential (primary) hypertension: Secondary | ICD-10-CM | POA: Diagnosis not present

## 2020-06-17 ENCOUNTER — Ambulatory Visit: Payer: Medicare HMO | Admitting: Cardiology

## 2020-06-17 ENCOUNTER — Other Ambulatory Visit: Payer: Self-pay

## 2020-06-17 ENCOUNTER — Encounter: Payer: Self-pay | Admitting: Cardiology

## 2020-06-17 DIAGNOSIS — Z45018 Encounter for adjustment and management of other part of cardiac pacemaker: Secondary | ICD-10-CM | POA: Insufficient documentation

## 2020-06-17 DIAGNOSIS — I442 Atrioventricular block, complete: Secondary | ICD-10-CM | POA: Diagnosis not present

## 2020-06-17 DIAGNOSIS — Z950 Presence of cardiac pacemaker: Secondary | ICD-10-CM | POA: Diagnosis not present

## 2020-06-17 HISTORY — DX: Presence of cardiac pacemaker: Z95.0

## 2020-06-17 HISTORY — DX: Encounter for adjustment and management of other part of cardiac pacemaker: Z45.018

## 2020-06-17 HISTORY — DX: Atrioventricular block, complete: I44.2

## 2020-06-17 NOTE — Progress Notes (Signed)
Chief Complaint  Patient presents with  . Pacemaker check      ICD-10-CM   1. Encounter for care of pacemaker  Z45.018   2. Pacemaker Dual chamber Medtronic Advisa DR MRI SureScan 01/29/2014  Z95.0   3. 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  EGMs reveal paroxysmal atrial fibrillation, latest episode 09/24/2019.  Longest episode 09/21/2019 for 2 hours and 40 minutes.  There are 4 ventricular high rate episodes, EGM consistent with brief less than 2-second NSVT. AP 18%, VP 100%. Longevity 2 years.  Normal pacemaker function.  Scheduled  In office pacemaker check 06/17/2020 (Pacemaker Dual chamber Medtronic Advisa DR MRI SureScan 01/29/2014) Single (S)/Dual (D)/BV: D. Presenting ASVP @ 82/min. Pacemaker dependant:  YES. Underlying AS VP @ 40/min. AP 18%, VP 100%. AMS Episodes 4.  AT/AF burden < 0.1% since Dec 2019 . Longest 2H 40 M of A. Fib. Latest 09/24/2019 for 7 minutes. HVR 4. Longest 2Sec of NSVT. Longevity 2 Years. Magnet rate: >85%. Lead measurements: Stable. Histogram: Low (L)/normal (N)/high (H)  Normal. Patient activity Low.   Scheduled Remote pacemaker check    Observations: Normal pacemaker function. Changes: None.  Recommendations:  She also transmitted her pacemaker today as she is now has a new adapter.  Remote transmission also reviewed.  Patient is now able to transmit remotely, patient is pacemaker dependent and importance of regular transmissions discussed with the patient and her daughter.

## 2020-07-03 ENCOUNTER — Encounter: Payer: PRIVATE HEALTH INSURANCE | Admitting: Cardiology

## 2020-07-27 ENCOUNTER — Inpatient Hospital Stay (HOSPITAL_COMMUNITY)
Admission: EM | Admit: 2020-07-27 | Discharge: 2020-07-29 | DRG: 202 | Disposition: A | Discharge status: Home / Self Care | Payer: Medicare HMO | Attending: Internal Medicine | Admitting: Internal Medicine

## 2020-07-27 ENCOUNTER — Emergency Department (HOSPITAL_COMMUNITY): Payer: Medicare HMO

## 2020-07-27 ENCOUNTER — Other Ambulatory Visit: Payer: Self-pay

## 2020-07-27 ENCOUNTER — Encounter (HOSPITAL_COMMUNITY): Payer: Self-pay

## 2020-07-27 DIAGNOSIS — Z87891 Personal history of nicotine dependence: Secondary | ICD-10-CM

## 2020-07-27 DIAGNOSIS — E871 Hypo-osmolality and hyponatremia: Secondary | ICD-10-CM | POA: Diagnosis not present

## 2020-07-27 DIAGNOSIS — J4551 Severe persistent asthma with (acute) exacerbation: Secondary | ICD-10-CM | POA: Diagnosis not present

## 2020-07-27 DIAGNOSIS — J45901 Unspecified asthma with (acute) exacerbation: Secondary | ICD-10-CM | POA: Diagnosis present

## 2020-07-27 DIAGNOSIS — Z7982 Long term (current) use of aspirin: Secondary | ICD-10-CM

## 2020-07-27 DIAGNOSIS — R0602 Shortness of breath: Secondary | ICD-10-CM | POA: Diagnosis not present

## 2020-07-27 DIAGNOSIS — I10 Essential (primary) hypertension: Secondary | ICD-10-CM | POA: Diagnosis present

## 2020-07-27 DIAGNOSIS — I21A1 Myocardial infarction type 2: Secondary | ICD-10-CM | POA: Diagnosis present

## 2020-07-27 DIAGNOSIS — D649 Anemia, unspecified: Secondary | ICD-10-CM | POA: Diagnosis present

## 2020-07-27 DIAGNOSIS — R52 Pain, unspecified: Secondary | ICD-10-CM

## 2020-07-27 DIAGNOSIS — Z825 Family history of asthma and other chronic lower respiratory diseases: Secondary | ICD-10-CM

## 2020-07-27 DIAGNOSIS — E782 Mixed hyperlipidemia: Secondary | ICD-10-CM | POA: Diagnosis present

## 2020-07-27 DIAGNOSIS — N179 Acute kidney failure, unspecified: Secondary | ICD-10-CM | POA: Diagnosis present

## 2020-07-27 DIAGNOSIS — R0603 Acute respiratory distress: Secondary | ICD-10-CM | POA: Diagnosis not present

## 2020-07-27 DIAGNOSIS — N289 Disorder of kidney and ureter, unspecified: Secondary | ICD-10-CM

## 2020-07-27 DIAGNOSIS — Z950 Presence of cardiac pacemaker: Secondary | ICD-10-CM

## 2020-07-27 DIAGNOSIS — Z20822 Contact with and (suspected) exposure to covid-19: Secondary | ICD-10-CM | POA: Diagnosis present

## 2020-07-27 DIAGNOSIS — R0902 Hypoxemia: Secondary | ICD-10-CM | POA: Diagnosis not present

## 2020-07-27 DIAGNOSIS — R062 Wheezing: Secondary | ICD-10-CM | POA: Diagnosis not present

## 2020-07-27 DIAGNOSIS — I959 Hypotension, unspecified: Secondary | ICD-10-CM | POA: Diagnosis not present

## 2020-07-27 DIAGNOSIS — E739 Lactose intolerance, unspecified: Secondary | ICD-10-CM | POA: Diagnosis present

## 2020-07-27 DIAGNOSIS — I700 Atherosclerosis of aorta: Secondary | ICD-10-CM | POA: Diagnosis not present

## 2020-07-27 DIAGNOSIS — Z66 Do not resuscitate: Secondary | ICD-10-CM | POA: Diagnosis present

## 2020-07-27 LAB — CBC WITH DIFFERENTIAL/PLATELET
Abs Immature Granulocytes: 0.02 10*3/uL (ref 0.00–0.07)
Basophils Absolute: 0.1 10*3/uL (ref 0.0–0.1)
Basophils Relative: 1 %
Eosinophils Absolute: 1.3 10*3/uL — ABNORMAL HIGH (ref 0.0–0.5)
Eosinophils Relative: 15 %
HCT: 35 % — ABNORMAL LOW (ref 36.0–46.0)
Hemoglobin: 11.2 g/dL — ABNORMAL LOW (ref 12.0–15.0)
Immature Granulocytes: 0 %
Lymphocytes Relative: 37 %
Lymphs Abs: 3.2 10*3/uL (ref 0.7–4.0)
MCH: 30.5 pg (ref 26.0–34.0)
MCHC: 32 g/dL (ref 30.0–36.0)
MCV: 95.4 fL (ref 80.0–100.0)
Monocytes Absolute: 0.4 10*3/uL (ref 0.1–1.0)
Monocytes Relative: 5 %
Neutro Abs: 3.6 10*3/uL (ref 1.7–7.7)
Neutrophils Relative %: 42 %
Platelets: 270 10*3/uL (ref 150–400)
RBC: 3.67 MIL/uL — ABNORMAL LOW (ref 3.87–5.11)
RDW: 13.2 % (ref 11.5–15.5)
WBC: 8.6 10*3/uL (ref 4.0–10.5)
nRBC: 0 % (ref 0.0–0.2)

## 2020-07-27 NOTE — ED Triage Notes (Signed)
EMS reports pt is from home. History of asthma and COPD. Uses inhaler BID, tonight, sudden onset of respiratory distress. Audible wheezing present. O2 sat 91% on RA, Solu-medrol 1125mg  IVP, Mag 2 grams, and albuterol x 2, and 1 atrovent. History of CHF.

## 2020-07-27 NOTE — ED Notes (Signed)
RT came to assess pt.

## 2020-07-27 NOTE — ED Triage Notes (Signed)
Emergency Medicine Provider Triage Evaluation Note  Sarah Burns , a 87 y.o. female  was evaluated in triage.  Pt complains of shortness of breath, wheezing for the past day.  Reports that the onset of respiratory distress despite using her albuterol inhaler.  EMS has given her Solu-Medrol, magnesium, albuterol and Atrovent. Reports some improvement with this. 91% on RA on EMS arrival, no supplemental O2 use at baseline.  Review of Systems  Positive: Shortness of breath, wheezing Negative: Chest pain  Physical Exam  BP (!) 169/67   Pulse 83   Temp 97.6 F (36.4 C) (Oral)   Resp (!) 26   SpO2 100%  Gen:   Awake, no distress   HEENT:  Atraumatic  Resp:  Tachypneic, retractions noted.  Wheezing noted throughout Cardiac:  Normal rate  Abd:   Nondistended, nontender  MSK:   Moves extremities without difficulty  Neuro:  Speech clear   Medical Decision Making  Medically screening exam initiated at 10:45 PM.  Appropriate orders placed.  Sarah Burns was informed that the remainder of the evaluation will be completed by another provider, this initial triage assessment does not replace that evaluation, and the importance of remaining in the ED until their evaluation is complete.  Clinical Impression  Patient will need that soon as possible.  Will order lab work, EKG and chest x-ray.   Dietrich Pates, PA-C 07/27/20 2247

## 2020-07-28 ENCOUNTER — Observation Stay (HOSPITAL_COMMUNITY): Payer: Medicare HMO

## 2020-07-28 ENCOUNTER — Encounter (HOSPITAL_COMMUNITY): Payer: Self-pay | Admitting: Internal Medicine

## 2020-07-28 DIAGNOSIS — N179 Acute kidney failure, unspecified: Secondary | ICD-10-CM

## 2020-07-28 DIAGNOSIS — R778 Other specified abnormalities of plasma proteins: Secondary | ICD-10-CM

## 2020-07-28 DIAGNOSIS — I21A1 Myocardial infarction type 2: Secondary | ICD-10-CM | POA: Diagnosis not present

## 2020-07-28 DIAGNOSIS — R7989 Other specified abnormal findings of blood chemistry: Secondary | ICD-10-CM | POA: Diagnosis not present

## 2020-07-28 DIAGNOSIS — D649 Anemia, unspecified: Secondary | ICD-10-CM | POA: Diagnosis not present

## 2020-07-28 DIAGNOSIS — E871 Hypo-osmolality and hyponatremia: Secondary | ICD-10-CM | POA: Diagnosis not present

## 2020-07-28 DIAGNOSIS — I10 Essential (primary) hypertension: Secondary | ICD-10-CM

## 2020-07-28 DIAGNOSIS — R0602 Shortness of breath: Secondary | ICD-10-CM | POA: Diagnosis not present

## 2020-07-28 DIAGNOSIS — J4551 Severe persistent asthma with (acute) exacerbation: Secondary | ICD-10-CM | POA: Diagnosis not present

## 2020-07-28 DIAGNOSIS — R739 Hyperglycemia, unspecified: Secondary | ICD-10-CM

## 2020-07-28 DIAGNOSIS — E739 Lactose intolerance, unspecified: Secondary | ICD-10-CM | POA: Diagnosis present

## 2020-07-28 DIAGNOSIS — Z87891 Personal history of nicotine dependence: Secondary | ICD-10-CM | POA: Diagnosis not present

## 2020-07-28 DIAGNOSIS — Z7982 Long term (current) use of aspirin: Secondary | ICD-10-CM | POA: Diagnosis not present

## 2020-07-28 DIAGNOSIS — J45901 Unspecified asthma with (acute) exacerbation: Secondary | ICD-10-CM | POA: Diagnosis not present

## 2020-07-28 DIAGNOSIS — Z950 Presence of cardiac pacemaker: Secondary | ICD-10-CM

## 2020-07-28 DIAGNOSIS — Z66 Do not resuscitate: Secondary | ICD-10-CM | POA: Diagnosis not present

## 2020-07-28 DIAGNOSIS — Z20822 Contact with and (suspected) exposure to covid-19: Secondary | ICD-10-CM | POA: Diagnosis not present

## 2020-07-28 DIAGNOSIS — R0789 Other chest pain: Secondary | ICD-10-CM | POA: Diagnosis not present

## 2020-07-28 DIAGNOSIS — Z825 Family history of asthma and other chronic lower respiratory diseases: Secondary | ICD-10-CM | POA: Diagnosis not present

## 2020-07-28 DIAGNOSIS — E782 Mixed hyperlipidemia: Secondary | ICD-10-CM | POA: Diagnosis not present

## 2020-07-28 LAB — BASIC METABOLIC PANEL
Anion gap: 8 (ref 5–15)
Anion gap: 14 (ref 5–15)
BUN: 15 mg/dL (ref 8–23)
BUN: 16 mg/dL (ref 8–23)
CO2: 22 mmol/L (ref 22–32)
CO2: 18 mmol/L — ABNORMAL LOW (ref 22–32)
Calcium: 9.3 mg/dL (ref 8.9–10.3)
Calcium: 9.4 mg/dL (ref 8.9–10.3)
Chloride: 100 mmol/L (ref 98–111)
Chloride: 99 mmol/L (ref 98–111)
Creatinine, Ser: 1.27 mg/dL — ABNORMAL HIGH (ref 0.44–1.00)
Creatinine, Ser: 1.3 mg/dL — ABNORMAL HIGH (ref 0.44–1.00)
GFR, Estimated: 39 mL/min — ABNORMAL LOW (ref >60)
GFR, Estimated: 38 mL/min — ABNORMAL LOW (ref >60)
Glucose, Bld: 179 mg/dL — ABNORMAL HIGH (ref 70–99)
Glucose, Bld: 262 mg/dL — ABNORMAL HIGH (ref 70–99)
Potassium: 4.1 mmol/L (ref 3.5–5.1)
Potassium: 3.9 mmol/L (ref 3.5–5.1)
Sodium: 130 mmol/L — ABNORMAL LOW (ref 135–145)
Sodium: 131 mmol/L — ABNORMAL LOW (ref 135–145)

## 2020-07-28 LAB — RESP PANEL BY RT-PCR (FLU A&B, COVID) ARPGX2
Influenza A by PCR: NEGATIVE
Influenza B by PCR: NEGATIVE
SARS Coronavirus 2 by RT PCR: NEGATIVE

## 2020-07-28 LAB — ECHOCARDIOGRAM COMPLETE
AR max vel: 2.07 cm2
AV Area VTI: 2.22 cm2
AV Area mean vel: 2 cm2
AV Mean grad: 8 mmHg
AV Peak grad: 14.9 mmHg
Ao pk vel: 1.93 m/s
Area-P 1/2: 4.36 cm2
Height: 51 in
S' Lateral: 2.1 cm
Weight: 2192 oz

## 2020-07-28 LAB — GLUCOSE, CAPILLARY
Glucose-Capillary: 183 mg/dL — ABNORMAL HIGH (ref 70–99)
Glucose-Capillary: 171 mg/dL — ABNORMAL HIGH (ref 70–99)
Glucose-Capillary: 167 mg/dL — ABNORMAL HIGH (ref 70–99)

## 2020-07-28 LAB — TROPONIN I (HIGH SENSITIVITY)
Troponin I (High Sensitivity): 11 ng/L (ref <18)
Troponin I (High Sensitivity): 47 ng/L — ABNORMAL HIGH (ref <18)
Troponin I (High Sensitivity): 78 ng/L — ABNORMAL HIGH (ref <18)
Troponin I (High Sensitivity): 91 ng/L — ABNORMAL HIGH (ref <18)
Troponin I (High Sensitivity): 99 ng/L — ABNORMAL HIGH (ref <18)

## 2020-07-28 LAB — HIV ANTIBODY (ROUTINE TESTING W REFLEX): HIV Screen 4th Generation wRfx: NONREACTIVE

## 2020-07-28 LAB — BRAIN NATRIURETIC PEPTIDE: B Natriuretic Peptide: 65.9 pg/mL (ref 0.0–100.0)

## 2020-07-28 MED ORDER — LOSARTAN POTASSIUM 50 MG PO TABS
50.0000 mg | ORAL_TABLET | Freq: Every day | ORAL | Status: DC
  Administered 2020-07-28 – 2020-07-29 (×2): 50 mg via ORAL
  Filled 2020-07-28 (×2): qty 1

## 2020-07-28 MED ORDER — IPRATROPIUM BROMIDE 0.02 % IN SOLN
0.5000 mg | Freq: Once | RESPIRATORY_TRACT | Status: AC
  Administered 2020-07-28: 0.5 mg via RESPIRATORY_TRACT
  Filled 2020-07-28: qty 2.5

## 2020-07-28 MED ORDER — IPRATROPIUM-ALBUTEROL 0.5-2.5 (3) MG/3ML IN SOLN
3.0000 mL | Freq: Once | RESPIRATORY_TRACT | Status: AC
  Administered 2020-07-28: 3 mL via RESPIRATORY_TRACT
  Filled 2020-07-28: qty 3

## 2020-07-28 MED ORDER — INSULIN ASPART 100 UNIT/ML ~~LOC~~ SOLN
0.0000 [IU] | Freq: Three times a day (TID) | SUBCUTANEOUS | Status: DC
  Administered 2020-07-28 – 2020-07-29 (×2): 1 [IU] via SUBCUTANEOUS

## 2020-07-28 MED ORDER — IPRATROPIUM-ALBUTEROL 0.5-2.5 (3) MG/3ML IN SOLN
3.0000 mL | Freq: Three times a day (TID) | RESPIRATORY_TRACT | Status: DC
  Administered 2020-07-28 – 2020-07-29 (×4): 3 mL via RESPIRATORY_TRACT
  Filled 2020-07-28 (×4): qty 3

## 2020-07-28 MED ORDER — AMLODIPINE BESYLATE 10 MG PO TABS
10.0000 mg | ORAL_TABLET | Freq: Every day | ORAL | Status: DC
  Administered 2020-07-28 – 2020-07-29 (×2): 10 mg via ORAL
  Filled 2020-07-28 (×2): qty 1

## 2020-07-28 MED ORDER — ALBUTEROL SULFATE (2.5 MG/3ML) 0.083% IN NEBU: 2.5000 mg | INHALATION_SOLUTION | RESPIRATORY_TRACT | Status: DC | PRN

## 2020-07-28 MED ORDER — ASPIRIN EC 81 MG PO TBEC
81.0000 mg | DELAYED_RELEASE_TABLET | Freq: Every day | ORAL | Status: DC
  Administered 2020-07-28 – 2020-07-29 (×2): 81 mg via ORAL
  Filled 2020-07-28 (×2): qty 1

## 2020-07-28 MED ORDER — HEPARIN BOLUS VIA INFUSION
2000.0000 [IU] | Freq: Once | INTRAVENOUS | Status: AC
  Administered 2020-07-28: 2000 [IU] via INTRAVENOUS
  Filled 2020-07-28: qty 2000

## 2020-07-28 MED ORDER — GUAIFENESIN ER 600 MG PO TB12
600.0000 mg | ORAL_TABLET | Freq: Two times a day (BID) | ORAL | Status: DC
  Administered 2020-07-28 – 2020-07-29 (×3): 600 mg via ORAL
  Filled 2020-07-28 (×3): qty 1

## 2020-07-28 MED ORDER — IPRATROPIUM-ALBUTEROL 0.5-2.5 (3) MG/3ML IN SOLN: 3.0000 mL | Freq: Three times a day (TID) | RESPIRATORY_TRACT | Status: DC

## 2020-07-28 MED ORDER — MOMETASONE FURO-FORMOTEROL FUM 200-5 MCG/ACT IN AERO
2.0000 | INHALATION_SPRAY | Freq: Two times a day (BID) | RESPIRATORY_TRACT | Status: DC
  Administered 2020-07-28 – 2020-07-29 (×3): 2 via RESPIRATORY_TRACT
  Filled 2020-07-28: qty 8.8

## 2020-07-28 MED ORDER — ALUM & MAG HYDROXIDE-SIMETH 200-200-20 MG/5ML PO SUSP
30.0000 mL | Freq: Once | ORAL | Status: AC
  Administered 2020-07-28: 30 mL via ORAL
  Filled 2020-07-28: qty 30

## 2020-07-28 MED ORDER — CALCIUM CARBONATE-VITAMIN D 500-200 MG-UNIT PO TABS
1.0000 | ORAL_TABLET | Freq: Two times a day (BID) | ORAL | Status: DC
  Administered 2020-07-28 – 2020-07-29 (×3): 1 via ORAL
  Filled 2020-07-28 (×4): qty 1

## 2020-07-28 MED ORDER — METHYLPREDNISOLONE SODIUM SUCC 125 MG IJ SOLR
60.0000 mg | Freq: Two times a day (BID) | INTRAMUSCULAR | Status: DC
  Administered 2020-07-28 – 2020-07-29 (×3): 60 mg via INTRAVENOUS
  Filled 2020-07-28 (×4): qty 0.96

## 2020-07-28 MED ORDER — HEPARIN (PORCINE) 25000 UT/250ML-% IV SOLN
500.0000 [IU]/h | INTRAVENOUS | Status: DC
  Administered 2020-07-28: 500 [IU]/h via INTRAVENOUS
  Filled 2020-07-28 (×2): qty 250

## 2020-07-28 MED ORDER — BENZONATATE 100 MG PO CAPS: 100.0000 mg | ORAL_CAPSULE | Freq: Three times a day (TID) | ORAL | Status: DC | PRN

## 2020-07-28 MED ORDER — ENOXAPARIN SODIUM 30 MG/0.3ML ~~LOC~~ SOLN
30.0000 mg | SUBCUTANEOUS | Status: DC
  Administered 2020-07-28: 30 mg via SUBCUTANEOUS
  Filled 2020-07-28: qty 0.3

## 2020-07-28 MED ORDER — DOXYCYCLINE HYCLATE 100 MG PO TABS
100.0000 mg | ORAL_TABLET | Freq: Two times a day (BID) | ORAL | Status: DC
  Administered 2020-07-28 – 2020-07-29 (×4): 100 mg via ORAL
  Filled 2020-07-28 (×4): qty 1

## 2020-07-28 MED ORDER — ACETAMINOPHEN 500 MG PO TABS: 500.0000 mg | ORAL_TABLET | Freq: Every day | ORAL | Status: DC | PRN

## 2020-07-28 MED ORDER — ALBUTEROL (5 MG/ML) CONTINUOUS INHALATION SOLN
20.0000 mg/h | INHALATION_SOLUTION | Freq: Once | RESPIRATORY_TRACT | Status: AC
  Administered 2020-07-28: 20 mg/h via RESPIRATORY_TRACT
  Filled 2020-07-28: qty 20

## 2020-07-28 MED ORDER — LORATADINE 10 MG PO TABS
10.0000 mg | ORAL_TABLET | Freq: Every day | ORAL | Status: DC
  Administered 2020-07-28 – 2020-07-29 (×2): 10 mg via ORAL
  Filled 2020-07-28 (×2): qty 1

## 2020-07-28 MED ORDER — ACETAMINOPHEN 325 MG PO TABS
650.0000 mg | ORAL_TABLET | Freq: Once | ORAL | Status: AC
  Administered 2020-07-28: 650 mg via ORAL
  Filled 2020-07-28: qty 2

## 2020-07-28 MED ORDER — ATORVASTATIN CALCIUM 80 MG PO TABS
80.0000 mg | ORAL_TABLET | Freq: Every day | ORAL | Status: DC
  Administered 2020-07-28 – 2020-07-29 (×2): 80 mg via ORAL
  Filled 2020-07-28 (×2): qty 1

## 2020-07-28 MED ORDER — ASPIRIN 81 MG PO CHEW
324.0000 mg | CHEWABLE_TABLET | Freq: Once | ORAL | Status: AC
  Administered 2020-07-28: 324 mg via ORAL
  Filled 2020-07-28: qty 4

## 2020-07-28 MED ORDER — SODIUM CHLORIDE 0.9 % IV SOLN: INTRAVENOUS | Status: DC

## 2020-07-28 MED ORDER — PREDNISONE 20 MG PO TABS
40.0000 mg | ORAL_TABLET | Freq: Every day | ORAL | Status: DC
  Administered 2020-07-28: 40 mg via ORAL
  Filled 2020-07-28: qty 2

## 2020-07-28 MED ORDER — UMECLIDINIUM BROMIDE 62.5 MCG/INH IN AEPB
1.0000 | INHALATION_SPRAY | Freq: Every day | RESPIRATORY_TRACT | Status: DC
  Administered 2020-07-28 – 2020-07-29 (×2): 1 via RESPIRATORY_TRACT
  Filled 2020-07-28: qty 7

## 2020-07-28 NOTE — Progress Notes (Signed)
Patient is admitted this morning, details please refer to HPI, patient is seen and examined at bedside.  Vital signs are stable, she does has intermittent cough, and wheezing, but does not appear in acute distress, she she is alert oriented x3.  Asthma/COPD exacerbation Diffuse bilateral wheezing on exam, no hypoxia at rest, change oral prednisone to iv solumedrol , add duoneb, mucinex, continue current abx Daughter would like to have a nebulizer prescription at  discharge   Troponin trend up, had chest pain yesterday, currently denies chest pain, repeat ekg no interval changes, discussed with cards Dr Rosemary Holms who recommend heparin drip for 48hrs, echocardiogram. Cards will see her in am  AKI Check ua, start gentle hydration, normal saline 75 cc/h x 24 hours  Elevated fasting blood glucose, daughter denies history of diabetes, patient is not on any diabetes medication at home, will check A1c, start sliding scale insulin as she is on steroid, discussed with daughter who agrees  Updated daughter over the phone,  Verified code status with daughter and patient, she confirmed DNR status

## 2020-07-28 NOTE — Progress Notes (Signed)
ANTICOAGULATION CONSULT NOTE - Initial Consult  Pharmacy Consult for heparin Indication: troponin elevation  Allergies  Allergen Reactions  . Lactose Intolerance (Gi) Nausea And Vomiting and Other (See Comments)    Stomach pain  . Penicillins Other (See Comments)    Unknown reaction     Patient Measurements: Height: 4\' 3"  (129.5 cm) Weight: 62.1 kg (137 lb) IBW/kg (Calculated) : 24.8 Heparin Dosing Weight: 40.3 kg  Vital Signs: Temp: 98.8 F (37.1 C) (04/17 0904) Temp Source: Oral (04/17 0904) BP: 146/51 (04/17 0904) Pulse Rate: 96 (04/17 0904)  Labs: Recent Labs    07/27/20 2245 07/28/20 0230 07/28/20 0654 07/28/20 0933 07/28/20 1110  HGB 11.2*  --   --   --   --   HCT 35.0*  --   --   --   --   PLT 270  --   --   --   --   CREATININE 1.27*  --   --  1.30*  --   TROPONINIHS  --    < > 47* 78* 91*   < > = values in this interval not displayed.    Estimated Creatinine Clearance: 16.9 mL/min (A) (by C-G formula based on SCr of 1.3 mg/dL (H)).   Medical History: Past Medical History:  Diagnosis Date  . Acute cholecystitis 09/17/2019  . Allergies   . Asthma   . Encounter for care of pacemaker 06/17/2020  . Heart block AV complete (HCC) 06/17/2020  . Hyperlipidemia   . Hypertension   . Pacemaker Dual chamber Medtronic Advisa DR MRI SureScan 01/29/2014 06/17/2020    Medications:  Scheduled:  . amLODipine  10 mg Oral Daily  . aspirin EC  81 mg Oral Daily  . atorvastatin  80 mg Oral Daily  . calcium-vitamin D  1 tablet Oral BID WC  . doxycycline  100 mg Oral Q12H  . guaiFENesin  600 mg Oral BID  . heparin  2,000 Units Intravenous Once  . ipratropium-albuterol  3 mL Nebulization Q8H  . loratadine  10 mg Oral Daily  . losartan  50 mg Oral Daily  . methylPREDNISolone (SOLU-MEDROL) injection  60 mg Intravenous Q12H  . mometasone-formoterol  2 puff Inhalation BID  . umeclidinium bromide  1 puff Inhalation Daily   Infusions:  . sodium chloride    . heparin       Assessment: 60 yoF admitted for asthma exacerbation. Found to have troponin elevation 47>78>91. No PTA oral anticoagulation. Pharmacy consulted to dose heparin.  Last dose of prophylactic enoxaparin was given at 0818 this AM. Hgb 11.2, plt wnl. Renal function elevated from baseline, CrCl = 17 ml/min.   Goal of Therapy:  Heparin level 0.3-0.7 units/ml Monitor platelets by anticoagulation protocol: Yes   Plan:  Give 2000 units bolus x 1 Start heparin infusion at 500 units/hr  Heparin level in 8 hours Monitor daily heparin level, CBC, s/sx of bleeding  83, PharmD, BCPS PGY2 Pharmacy Resident Phone between 7 am - 3:30 pm: Lulu Riding  Please check AMION for all [facility] Pharmacy phone numbers After 10:00 PM, call Main Pharmacy [phone]  07/28/2020,12:41 PM

## 2020-07-28 NOTE — H&P (Signed)
History and Physical    Anabelle Bungert GQB:[id number] DOB: 03-Feb-1925 DOA: 07/27/2020  PCP: Renaye Rakers, MD  Patient coming from: Home   I have personally briefly reviewed patient's old medical records in [facility]  Chief Complaint: Asthma exacerbation  HPI: Sarah Burns is a 87 y.o. female with medical history significant of asthma, CHB s/p PPM, HTN, HLD.  Pt presents to ED with c/o SOB, wheezing, cough.  Symptoms onset this past day, worsening, severe, not helped by home inhaler.  No fevers, chills.   ED Course: Breathing improved after neb treatments, solumedrol.  Did develop some CP in ED.  Trop pending  BNP nl  CXR neg  Influenza and COVID neg   Review of Systems: As per HPI, otherwise all review of systems negative.  Past Medical History:  Diagnosis Date  . Acute cholecystitis 09/17/2019  . Allergies   . Asthma   . Encounter for care of pacemaker 06/17/2020  . Heart block AV complete (HCC) 06/17/2020  . Hyperlipidemia   . Hypertension   . Pacemaker Dual chamber Medtronic Advisa DR MRI SureScan 01/29/2014 06/17/2020    Past Surgical History:  Procedure Laterality Date  . CHOLECYSTECTOMY N/A 09/19/2019   Procedure: LAPAROSCOPIC CHOLECYSTECTOMY;  Surgeon: Axel Filler, MD;  Location: [facility] OR;  Service: General;  Laterality: N/A;  . GROIN DISSECTION    . INSERT / REPLACE / REMOVE PACEMAKER    . nasal polyp removal       reports that she has quit smoking. Her smoking use included cigarettes. She has a 5.00 pack-year smoking history. She has never used smokeless tobacco. She reports previous alcohol use. No history on file for drug use.  Allergies  Allergen Reactions  . Lactose Intolerance (Gi) Nausea And Vomiting and Other (See Comments)    Stomach pain  . Penicillins Other (See Comments)    Unknown reaction     Family History  Problem Relation Age of Onset  . Asthma Brother   . Allergies Other   . Asthma Other      Prior to Admission  medications   Medication Sig Start Date End Date Taking? Authorizing Provider  acetaminophen (TYLENOL) 500 MG tablet Take 500 mg by mouth daily as needed for headache (pain).   Yes [provider]  amLODipine (NORVASC) 10 MG tablet Take 10 mg by mouth daily.   Yes [provider]  aspirin EC 81 MG tablet Take 1 tablet (81 mg total) by mouth daily. Swallow whole. 04/08/20  Yes Tolia, Sunit, DO  atorvastatin (LIPITOR) 80 MG tablet Take 80 mg by mouth daily.   Yes [provider]  Calcium Carb-Cholecalciferol (CALCIUM 600-D PO) Take 1 tablet by mouth 2 (two) times daily.   Yes [provider]  cetirizine (ZYRTEC) 10 MG tablet Take 10 mg by mouth daily as needed for allergies.   Yes [provider]  diclofenac Sodium (VOLTAREN) 1 % GEL Apply 1 application topically 2 (two) times daily as needed (knee pain).   Yes [provider]  fluticasone-salmeterol (ADVAIR HFA) 115-21 MCG/ACT inhaler Inhale 2 puffs into the lungs 2 (two) times daily as needed (shortness of breath/wheezing).   Yes [provider]  Lactase (LACTAID PO) Take 1 tablet by mouth as needed (when consuming dairy).   Yes [provider]  losartan (COZAAR) 50 MG tablet Take 50 mg by mouth daily.   Yes [provider]  OVER THE COUNTER MEDICATION Take 1 tablet by mouth daily. Super beets  Yes [provider]  psyllium (METAMUCIL) 58.6 % powder Take 1 packet by mouth daily as needed (mild constipation).   Yes [provider]  montelukast (SINGULAIR) 10 MG tablet Take 10 mg by mouth at bedtime. Patient not taking: Reported on 07/28/2020 01/16/20   [provider]    Physical Exam: Vitals:   07/28/20 0130 07/28/20 0145 07/28/20 0200 07/28/20 0300  BP: (!) 166/58  (!) 161/51 131/77  Pulse: 90 98  (!) 102  Resp: 19 13 14 20   Temp:      TempSrc:      SpO2: 100% 100% 100% 99%  Weight:      Height:        Constitutional: NAD, calm,  comfortable Eyes: PERRL, lids and conjunctivae normal ENMT: Mucous membranes are moist. Posterior pharynx clear of any exudate or lesions.Normal dentition.  Neck: normal, supple, no masses, no thyromegaly Respiratory: B wheezing Cardiovascular: Mild tachycardia Abdomen: no tenderness, no masses palpated. No hepatosplenomegaly. Bowel sounds positive.  Musculoskeletal: no clubbing / cyanosis. No joint deformity upper and lower extremities. Good ROM, no contractures. Normal muscle tone.  Skin: no rashes, lesions, ulcers. No induration Neurologic: CN 2-12 grossly intact. Sensation intact, DTR normal. Strength 5/5 in all 4.  Tremor noted with movement (chronic per daughter).  Psychiatric: Normal judgment and insight. Alert and oriented x 3. Normal mood.    Labs on Admission: I have personally reviewed following labs and imaging studies  CBC: Recent Labs  Lab 07/27/20 2245  WBC 8.6  NEUTROABS 3.6  HGB 11.2*  HCT 35.0*  MCV 95.4  PLT 270   Basic Metabolic Panel: Recent Labs  Lab 07/27/20 2245  NA 130*  K 4.1  CL 100  CO2 22  GLUCOSE 179*  BUN 15  CREATININE 1.27*  CALCIUM 9.3   GFR: Estimated Creatinine Clearance: 17.3 mL/min (A) (by C-G formula based on SCr of 1.27 mg/dL (H)). Liver Function Tests: No results for input(s): AST, ALT, ALKPHOS, BILITOT, PROT, ALBUMIN in the last 168 hours. No results for input(s): LIPASE, AMYLASE in the last 168 hours. No results for input(s): AMMONIA in the last 168 hours. Coagulation Profile: No results for input(s): INR, PROTIME in the last 168 hours. Cardiac Enzymes: No results for input(s): CKTOTAL, CKMB, CKMBINDEX, TROPONINI in the last 168 hours. BNP (last 3 results) No results for input(s): PROBNP in the last 8760 hours. HbA1C: No results for input(s): HGBA1C in the last 72 hours. CBG: No results for input(s): GLUCAP in the last 168 hours. Lipid Profile: No results for input(s): CHOL, HDL, LDLCALC, TRIG, CHOLHDL, LDLDIRECT in  the last 72 hours. Thyroid Function Tests: No results for input(s): TSH, T4TOTAL, FREET4, T3FREE, THYROIDAB in the last 72 hours. Anemia Panel: No results for input(s): VITAMINB12, FOLATE, FERRITIN, TIBC, IRON, RETICCTPCT in the last 72 hours. Urine analysis:    Component Value Date/Time   COLORURINE YELLOW 09/18/2019 1030   APPEARANCEUR CLEAR 09/18/2019 1030   LABSPEC 1.018 09/18/2019 1030   PHURINE 6.0 09/18/2019 1030   GLUCOSEU NEGATIVE 09/18/2019 1030   HGBUR MODERATE (A) 09/18/2019 1030   BILIRUBINUR NEGATIVE 09/18/2019 1030   KETONESUR NEGATIVE 09/18/2019 1030   PROTEINUR >=300 (A) 09/18/2019 1030   NITRITE NEGATIVE 09/18/2019 1030   LEUKOCYTESUR NEGATIVE 09/18/2019 1030    Radiological Exams on Admission: DG Chest 2 View  Result Date: 07/27/2020 CLINICAL DATA:  Respiratory distress EXAM: CHEST - 2 VIEW COMPARISON:  03/22/2020 FINDINGS: Cardiac shadow is stable. Pacing device is again seen. Aortic calcifications  are noted. No bony abnormality is noted. Lungs are clear bilaterally. IMPRESSION: No acute abnormality noted. Electronically Signed   By: Alcide Clever M.D.   On: 07/27/2020 23:14    EKG: Independently reviewed. Atrial sensed ventricular paced rhythm.  Assessment/Plan Principal Problem:   Asthma exacerbation Active Problems:   Hypertension   Pacemaker Dual chamber Medtronic Advisa DR MRI SureScan 01/29/2014    1. Asthma exacerbation - 1. COPD pathway 2. Con pulse ox 3. PRN SABA 4. Scheduled LABA/LAMA and INH steroid 5. Prednisone 6. Rocephin 2. HTN - 1. Cont home BP meds 3. CHB s/p PPM - 1. Rhythm is paced 4. CP in ED - 1. Resolved at this time 2. Trop is pending 3. EKG just shows ASVP rhythm.  DVT prophylaxis: Lovenox Code Status: Full Family Communication: Daughter at bedside Disposition Plan: Home after breathing improved Consults called: None Admission status: Place in 17   Wylma Tatem M. DO [facility]  How to contact the  [facility] Attending or Consulting provider 7A - 7P or covering provider during after hours 7P -7A, for this patient?  1. Check the care team in [facility] and look for a) attending/consulting [facility] provider listed and b) the [facility] team listed 2. Log into [email or web address]  Amion Physician Scheduling and messaging for groups and whole hospitals  On call and physician scheduling software for group practices, residents, hospitalists and other medical providers for call, clinic, rotation and shift schedules. On[employer] is a hospital-wide system for scheduling doctors and paging doctors on call. EasyPlot is for scientific plotting and data analysis.  [email or web address]  and use [place]'s universal password to access. If you do not have the password, please contact the hospital operator.  3. Locate the [facility] provider you are looking for under [facility] and page to a number that you can be directly reached. 4. If you still have difficulty reaching the provider, please page the [facility] (Director on Call) for the Hospitalists listed on amion for assistance.  07/28/2020, 3:42 AM

## 2020-07-28 NOTE — Evaluation (Signed)
Occupational Therapy Evaluation Patient Details Name: Sarah Burns MRN: [id number] DOB: 12/18/1924 Today's Date: 07/28/2020    History of Present Illness 87 yo female presents to ED with c/o SOB, wheezing, cough.  Symptoms onset this past day, worsening, severe, not helped by home inhaler. medical history significant of asthma, CHB s/p PPM, HTN, HLD.   Clinical Impression   PTA patient reports independent with mobility using cane, some assist as needed for ADLs from daughter (depending on the day).  Admitted for above and limited by problem list below, including impaired balance, decreased activity tolerance and generalized weakness. Patient nauseated during session and requires increased time for all tasks.  Patient pleasant and engages appropriately with therapist, but some decreased safety awareness noted. She benefits from use of RW for mobility and transfers in room with min assist, completing ADLs with up to min assist.  Based on performance today, believe pt will benefit from further OT services acutely and after dc at [facility] level in order to optimize independence, safety and increase tolerance for ADLs and mobility.     Follow Up Recommendations  Home health OT;Supervision/Assistance - 24 hour (may progress to no OT followup)    Equipment Recommendations  3 in 1 bedside commode    Recommendations for Other Services       Precautions / Restrictions Precautions Precautions: Fall Precaution Comments: Fall risk decreased with use of RW Restrictions Weight Bearing Restrictions: No      Mobility Bed Mobility Overal bed mobility: Needs Assistance Bed Mobility: Supine to Sit     Supine to sit: Min guard;Min assist     General bed mobility comments: Used rails to pull to sit; Cues to scoot to EOB and get feet to the floor; occasional min assist due to trunk slowly drifting into mild posterior lean    Transfers Overall transfer level: Needs assistance Equipment used: 2 person  hand held assist Transfers: Sit to/from Stand Sit to Stand: Min assist         General transfer comment: Min assist to steady    Balance Overall balance assessment: Needs assistance Sitting-balance support: No upper extremity supported;Feet supported Sitting balance-Leahy Scale: Fair Sitting balance - Comments: inital posterior lean until feet stablized   Standing balance support: Bilateral upper extremity supported;During functional activity;No upper extremity supported Standing balance-Leahy Scale: Poor Standing balance comment: relies on BUE support dynamically, min guard at sink with 0-1 UE support during ADLs                           ADL either performed or assessed with clinical judgement   ADL Overall ADL's : Needs assistance/impaired     Grooming: Min guard;Sitting   Upper Body Bathing: Set up;Sitting   Lower Body Bathing: Minimal assistance;Sit to/from stand   Upper Body Dressing : Set up;Sitting   Lower Body Dressing: Minimal assistance;Sit to/from stand   Toilet Transfer: Min guard;Ambulation;RW;BSC   Toileting- Architect and Hygiene: Min guard;Sit to/from stand       Functional mobility during ADLs: Min guard;Rolling walker;Cueing for safety General ADL Comments: pt limited by nausea, weakness, imparied balance     Vision   Vision Assessment?: No apparent visual deficits     Perception     Praxis      Pertinent Vitals/Pain Pain Assessment: No/denies pain     Hand Dominance Right   Extremity/Trunk Assessment Upper Extremity Assessment Upper Extremity Assessment: Generalized weakness   Lower Extremity Assessment Lower  Extremity Assessment: Defer to PT evaluation       Communication Communication Communication: No difficulties   Cognition Arousal/Alertness: Awake/alert Behavior During Therapy: WFL for tasks assessed/performed Overall Cognitive Status: Within Functional Limits for tasks assessed                                  General Comments: WFL for tasks assessed   General Comments  VSS on RA, slight wheezing noted at times; HR ranged from 90-110.    Exercises     Shoulder Instructions      Home Living Family/patient expects to be discharged to:: Private residence Living Arrangements: Children (daughter) Available Help at Discharge: Family;Available 24 hours/day Type of Home: House Home Access: Stairs to enter [employer] of Steps: 3 Entrance Stairs-Rails: Right;Left;Can reach both Home Layout: One level     Bathroom Shower/Tub: Chief Strategy Officer: Standard     Home Equipment: Environmental consultant - 2 wheels;Walker - 4 wheels;Cane - single point;Hand held shower head;Grab bars - tub/shower;Shower seat          Prior Functioning/Environment Level of Independence: Independent        Comments: pt reports using cane at times, reports daughter can assist as needed (but not always needing help)        OT Problem List: Decreased strength;Decreased activity tolerance;Impaired balance (sitting and/or standing);Decreased knowledge of use of DME or AE;Decreased knowledge of precautions      OT Treatment/Interventions: Self-care/ADL training;DME and/or AE instruction;Energy conservation;Therapeutic activities;Patient/family education;Balance training    OT Goals(Current goals can be found in the care plan section) Acute Rehab OT Goals Patient Stated Goal: get better OT Goal Formulation: With patient Time For Goal Achievement: 08/11/20 Potential to Achieve Goals: Good  OT Frequency: Min 2X/week   Barriers to D/C:            Co-evaluation              AM-PAC OT "6 Clicks" Daily Activity     Outcome Measure Help from another person eating meals?: None Help from another person taking care of personal grooming?: A Little Help from another person toileting, which includes using toliet, bedpan, or urinal?: A Little Help from another person  bathing (including washing, rinsing, drying)?: A Little Help from another person to put on and taking off regular upper body clothing?: A Little Help from another person to put on and taking off regular lower body clothing?: A Little 6 Click Score: 19   End of Session Equipment Utilized During Treatment: Rolling walker Nurse Communication: Mobility status  Activity Tolerance: Patient tolerated treatment well Patient left: in chair;with call bell/phone within reach;with chair alarm set  OT Visit Diagnosis: Other abnormalities of gait and mobility (R26.89);Muscle weakness (generalized) (M62.81)                Time: 1829-9371 OT Time Calculation (min): 22 min Charges:  OT General Charges $OT Visit: 1 Visit OT Evaluation $OT Eval Moderate Complexity: 1 Mod  Barry Brunner, OT Acute Rehabilitation Services Pager [phone] Office [phone]   Chancy Milroy 07/28/2020, 11:33 AM

## 2020-07-28 NOTE — ED Provider Notes (Signed)
[facility] EMERGENCY DEPARTMENT Provider Note   CSN: [id number] Arrival date & time: 07/27/20  2237    History Chief complaint: Asthma exacerbation  Sarah Burns is a 87 y.o. female.  The history is provided by the patient.  She has history of hypertension, hyperlipidemia, complete heart block status post pacemaker insertion, asthma and comes in because of difficulty breathing and coughing.  She tried using her albuterol inhaler at home with slight relief.  She denies fever, chills, sweats.  She came in by ambulance and received methylprednisolone, magnesium, albuterol, ipratropium with partial improvement.  However, she is nowhere near her baseline.  She states she has never been admitted to the hospital for her asthma.  Past Medical History:  Diagnosis Date  . Acute cholecystitis 09/17/2019  . Allergies   . Asthma   . Encounter for care of pacemaker 06/17/2020  . Heart block AV complete (HCC) 06/17/2020  . Hyperlipidemia   . Hypertension   . Pacemaker Dual chamber Medtronic Advisa DR MRI SureScan 01/29/2014 06/17/2020    Patient Active Problem List   Diagnosis Date Noted  . Encounter for care of pacemaker 06/17/2020  . Pacemaker Dual chamber Medtronic Advisa DR MRI SureScan 01/29/2014 06/17/2020  . Heart block AV complete (HCC) 06/17/2020  . Pressure injury of skin 09/18/2019  . Hyponatremia 09/18/2019  . Anemia 09/18/2019  . Hypertension   . Asthma   . Allergies   . Hyperlipidemia   . Acute cholecystitis 09/17/2019    Past Surgical History:  Procedure Laterality Date  . CHOLECYSTECTOMY N/A 09/19/2019   Procedure: LAPAROSCOPIC CHOLECYSTECTOMY;  Surgeon: Axel Filler, MD;  Location: [facility] OR;  Service: General;  Laterality: N/A;  . GROIN DISSECTION    . INSERT / REPLACE / REMOVE PACEMAKER    . nasal polyp removal       OB History   No obstetric history on file.     History reviewed. No pertinent family history.  Social History   Tobacco Use   . Smoking status: Former Smoker    Packs/day: 1.00    Years: 5.00    Pack years: 5.00    Types: Cigarettes  . Smokeless tobacco: Never Used  . Tobacco comment: pt didnt inhale, 1 pack per week   Vaping Use  . Vaping Use: Never used  Substance Use Topics  . Alcohol use: Not Currently    Comment: occ    Home Medications Prior to Admission medications   Medication Sig Start Date End Date Taking? Authorizing Provider  acetaminophen (TYLENOL) 500 MG tablet Take 500 mg by mouth daily as needed for headache (pain).    [provider]  amLODipine (NORVASC) 10 MG tablet Take 10 mg by mouth daily.    [provider]  aspirin EC 81 MG tablet Take 1 tablet (81 mg total) by mouth daily. Swallow whole. 04/08/20   Tolia, Sunit, DO  atorvastatin (LIPITOR) 80 MG tablet Take 80 mg by mouth daily.    [provider]  Calcium Carb-Cholecalciferol (CALCIUM 600-D PO) Take 1 tablet by mouth 2 (two) times daily.    [provider]  diclofenac Sodium (VOLTAREN) 1 % GEL Apply 1 application topically 2 (two) times daily as needed (knee pain).    [provider]  fluticasone-salmeterol (ADVAIR HFA) 115-21 MCG/ACT inhaler Inhale 2 puffs into the lungs 2 (two) times daily as needed (shortness of breath/wheezing).    [provider]  Lactase (LACTAID PO) Take 1 tablet by mouth as needed (when  consuming dairy).    [provider]  losartan (COZAAR) 50 MG tablet Take 50 mg by mouth daily.    [provider]  montelukast (SINGULAIR) 10 MG tablet Take 10 mg by mouth at bedtime. 01/16/20   [provider]    Allergies    Lactose intolerance (gi) and Penicillins  Review of Systems   Review of Systems  All other systems reviewed and are negative.   Physical Exam Updated Vital Signs BP (!) 169/67   Pulse 83   Temp 97.6 F (36.4 C) (Oral)   Resp (!) 26   Ht 4\' 3"  (1.295 m)   Wt 62.1 kg   SpO2 99%   BMI 37.03 kg/m   Physical  Exam Vitals and nursing note reviewed.   87 year old female, resting comfortably and in no acute distress. Vital signs are significant for elevated blood pressure and respiratory rate. Oxygen saturation is 99%, which is normal.  She is barely able to complete a sentence without having to take a second breath. Head is normocephalic and atraumatic. PERRLA, EOMI. Oropharynx is clear. Neck is nontender and supple without adenopathy or JVD. Back is nontender and there is no CVA tenderness. Lungs have diffuse inspiratory and expiratory wheezes without rales or rhonchi. Chest is nontender. Heart has regular rate and rhythm without murmur. Abdomen is soft, flat, nontender without masses or hepatosplenomegaly and peristalsis is normoactive. Extremities have no cyanosis or edema, full range of motion is present. Skin is warm and dry without rash. Neurologic: Mental status is normal, cranial nerves are intact, there are no motor or sensory deficits.  ED Results / Procedures / Treatments   Labs (all labs ordered are listed, but only abnormal results are displayed) Labs Reviewed  CBC WITH DIFFERENTIAL/PLATELET - Abnormal; Notable for the following components:      Result Value   RBC 3.67 (*)    Hemoglobin 11.2 (*)    HCT 35.0 (*)    Eosinophils Absolute 1.3 (*)    All other components within normal limits  RESP PANEL BY RT-PCR (FLU A&B, COVID) ARPGX2  BASIC METABOLIC PANEL  BRAIN NATRIURETIC PEPTIDE    EKG EKG Interpretation  Date/Time:  Saturday July 27 2020 22:52:34 EDT Ventricular Rate:  80 PR Interval:  172 QRS Duration: 154 QT Interval:  440 QTC Calculation: 507 R Axis:   -87 Text Interpretation: Atrial-sensed ventricular-paced rhythm Abnormal ECG When compared with ECG of 09/17/2019, No significant change was found Confirmed by 11/17/2019 (Dione Booze) on 07/27/2020 11:47:37 PM   Radiology DG Chest 2 View  Result Date: 07/27/2020 CLINICAL DATA:  Respiratory distress EXAM: CHEST - 2  VIEW COMPARISON:  03/22/2020 FINDINGS: Cardiac shadow is stable. Pacing device is again seen. Aortic calcifications are noted. No bony abnormality is noted. Lungs are clear bilaterally. IMPRESSION: No acute abnormality noted. Electronically Signed   By: 14/01/2020 M.D.   On: 07/27/2020 23:14    Procedures Procedures   Medications Ordered in ED Medications  alum & mag hydroxide-simeth (MAALOX/MYLANTA) 200-200-20 MG/5ML suspension 30 mL (has no administration in time range)  aspirin chewable tablet 324 mg (has no administration in time range)  enoxaparin (LOVENOX) injection 30 mg (has no administration in time range)  predniSONE (DELTASONE) tablet 40 mg (has no administration in time range)  mometasone-formoterol (DULERA) 200-5 MCG/ACT inhaler 2 puff (has no administration in time range)  albuterol (PROVENTIL) (2.5 MG/3ML) 0.083% nebulizer solution 2.5 mg (has no administration in time range)  umeclidinium bromide (INCRUSE ELLIPTA)  62.5 MCG/INH 1 puff (has no administration in time range)  doxycycline (VIBRA-TABS) tablet 100 mg (has no administration in time range)  amLODipine (NORVASC) tablet 10 mg (has no administration in time range)  aspirin EC tablet 81 mg (has no administration in time range)  acetaminophen (TYLENOL) tablet 500 mg (has no administration in time range)  losartan (COZAAR) tablet 50 mg (has no administration in time range)  atorvastatin (LIPITOR) tablet 80 mg (has no administration in time range)  loratadine (CLARITIN) tablet 10 mg (has no administration in time range)  Calcium Carbonate-Vitamin D3 600-400 MG-UNIT TABS (has no administration in time range)  ipratropium-albuterol (DUONEB) 0.5-2.5 (3) MG/3ML nebulizer solution 3 mL (3 mLs Nebulization Given 07/28/20 0010)  albuterol (PROVENTIL,VENTOLIN) solution continuous neb (20 mg/hr Nebulization Given 07/28/20 0107)  ipratropium (ATROVENT) nebulizer solution 0.5 mg (0.5 mg Nebulization Given 07/28/20 0107)  acetaminophen  (TYLENOL) tablet 650 mg (650 mg Oral Given 07/28/20 0211)    ED Course  I have reviewed the triage vital signs and the nursing notes.  Pertinent labs & imaging results that were available during my care of the patient were reviewed by me and considered in my medical decision making (see chart for details).  MDM Rules/Calculators/A&P Acute exacerbation of asthma.  Chest x-ray ordered at triage shows no evidence of pneumonia.  ECG shows functioning pacemaker.  Respiratory pathogen panel is negative for influenza and COVID-19.  Old records are reviewed, and she has no relevant past visits.  She will be given additional albuterol and ipratropium.  Labs show mild hyponatremia which is not felt to be clinically significant.  Creatinine is elevated compared with 1 year ago which may represent acute kidney injury versus hypertensive nephropathy.  Mild anemia is present which is actually improved compared with 1 year ago.  Following albuterol with ipratropium, patient endorsed slight improvement, but she still had considerable wheezing.  She was given a continuous nebulizer treatment following which oxygen saturation on room air was adequate, but she still had significant wheezing and was not felt to be safe for discharge.  Patient also developed chest pain which she felt was like indigestion.  With her being pacemaker dependent, likelihood of seeing changes on ECG are minimal, so we will follow troponins.  She is given acetaminophen, aspirin, antacid.  Case is discussed with Dr. Julian Reil of [facility], who agrees to admit the patient.  Final Clinical Impression(s) / ED Diagnoses Final diagnoses:  Severe persistent asthma with exacerbation  Elevated blood pressure reading with diagnosis of hypertension  Renal insufficiency  Normochromic normocytic anemia  Hyponatremia    Rx / DC Orders ED Discharge Orders    None       Dione Booze, MD 07/28/20 [phone]

## 2020-07-28 NOTE — Progress Notes (Signed)
  Echocardiogram 2D Echocardiogram has been performed.  Roosvelt Maser F 07/28/2020, 3:18 PM

## 2020-07-28 NOTE — Consult Note (Signed)
CARDIOLOGY CONSULT NOTE  Patient ID: Sarah Burns MRN: [id number] DOB/AGE: 1924/12/17 87 y.o.  Admit date: 07/27/2020 Referring Physician: [facility] hospitalist Reason for Consultation:  Chest pain, troponin elevation  HPI:   87 y.o. African American female  with hypertension, mixed hyperlipidemia, former smoker, s/p Medtronic pacemaker placement in 2015 in [place], admitted with shortness of breath. Cardiology consulted given an episode of chest pain and troponin elevation.  Patient was at home with her daughter, which she developed scantly productive cough and shortness of breath.  She denied chest pain at the onset.  She does have baseline history of reported asthma as well as seasonal allergies.  Patient's daughter brought her to the emergency room, from where she was admitted.  Her cough and shortness of breath is improved after nebulizer treatment, as well as treatment for possible asthma exacerbation.  While in the emergency room, she had an episode of vomiting.  Just prior to that, she had complained of retrosternal burning sensation, which resolved immediately after vomiting.  She has not had any recurrence of chest pain during her hospital stay.  Work-up significant for mildly elevated and rising troponin.  Echocardiogram showed no significant wall motion abnormality, LVEF is normal.  BNP is normal.  At baseline, patient is independent, lives with her daughter.  Denies any exertional chest pain or shortness of breath symptoms.  Past Medical History:  Diagnosis Date  . Acute cholecystitis 09/17/2019  . Allergies   . Asthma   . Encounter for care of pacemaker 06/17/2020  . Heart block AV complete (HCC) 06/17/2020  . Hyperlipidemia   . Hypertension   . Pacemaker Dual chamber Medtronic Advisa DR MRI SureScan 01/29/2014 06/17/2020     Past Surgical History:  Procedure Laterality Date  . CHOLECYSTECTOMY N/A 09/19/2019   Procedure: LAPAROSCOPIC CHOLECYSTECTOMY;  Surgeon: Axel Filler,  MD;  Location: [facility] OR;  Service: General;  Laterality: N/A;  . GROIN DISSECTION    . INSERT / REPLACE / REMOVE PACEMAKER    . nasal polyp removal        Family History  Problem Relation Age of Onset  . Asthma Brother   . Allergies Other   . Asthma Other      Social History: Social History   Socioeconomic History  . Marital status: Single    Spouse name: Not on file  . Number of children: Not on file  . Years of education: Not on file  . Highest education level: Not on file  Occupational History  . Not on file  Tobacco Use  . Smoking status: Former Smoker    Packs/day: 1.00    Years: 5.00    Pack years: 5.00    Types: Cigarettes  . Smokeless tobacco: Never Used  . Tobacco comment: pt didnt inhale, 1 pack per week   Vaping Use  . Vaping Use: Never used  Substance and Sexual Activity  . Alcohol use: Not Currently    Comment: occ  . Drug use: Not on file  . Sexual activity: Not on file  Other Topics Concern  . Not on file  Social History Narrative  . Not on file   Social Determinants of Health   Financial Resource Strain: Not on file  Food Insecurity: Not on file  Transportation Needs: Not on file  Physical Activity: Not on file  Stress: Not on file  Social Connections: Not on file  Intimate Partner Violence: Not on file     Medications Prior to Admission  Medication Sig  Dispense Refill Last Dose  . acetaminophen (TYLENOL) 500 MG tablet Take 500 mg by mouth daily as needed for headache (pain).   07/27/2020 at Unknown time  . amLODipine (NORVASC) 10 MG tablet Take 10 mg by mouth daily.   07/27/2020 at Unknown time  . aspirin EC 81 MG tablet Take 1 tablet (81 mg total) by mouth daily. Swallow whole. 30 tablet 11 07/27/2020 at Unknown time  . atorvastatin (LIPITOR) 80 MG tablet Take 80 mg by mouth daily.   07/27/2020 at Unknown time  . Calcium Carb-Cholecalciferol (CALCIUM 600-D PO) Take 1 tablet by mouth 2 (two) times daily.   Past Week at Unknown time  .  cetirizine (ZYRTEC) 10 MG tablet Take 10 mg by mouth daily as needed for allergies.   Past Week at Unknown time  . diclofenac Sodium (VOLTAREN) 1 % GEL Apply 1 application topically 2 (two) times daily as needed (knee pain).   Past Week at Unknown time  . fluticasone-salmeterol (ADVAIR HFA) 115-21 MCG/ACT inhaler Inhale 2 puffs into the lungs 2 (two) times daily as needed (shortness of breath/wheezing).   07/27/2020 at Unknown time  . Lactase (LACTAID PO) Take 1 tablet by mouth as needed (when consuming dairy).   07/27/2020 at Unknown time  . losartan (COZAAR) 50 MG tablet Take 50 mg by mouth daily.   07/27/2020 at Unknown time  . OVER THE COUNTER MEDICATION Take 1 tablet by mouth daily. Super beets   07/27/2020 at Unknown time  . psyllium (METAMUCIL) 58.6 % powder Take 1 packet by mouth daily as needed (mild constipation).   Past Month at Unknown time  . montelukast (SINGULAIR) 10 MG tablet Take 10 mg by mouth at bedtime. (Patient not taking: Reported on 07/28/2020)   Not Taking at Unknown time    Review of Systems  Constitutional: Negative for decreased appetite, malaise/fatigue, weight gain and weight loss.  HENT: Negative for congestion.   Eyes: Negative for visual disturbance.  Cardiovascular: Positive for chest pain (1 episode, now resolved). Negative for dyspnea on exertion, leg swelling, palpitations and syncope.  Respiratory: Positive for cough (Now improved) and shortness of breath (Now improved).   Endocrine: Negative for cold intolerance.  Hematologic/Lymphatic: Does not bruise/bleed easily.  Skin: Negative for itching and rash.  Musculoskeletal: Negative for myalgias.  Gastrointestinal: Negative for abdominal pain, nausea and vomiting.  Genitourinary: Negative for dysuria.  Neurological: Negative for dizziness and weakness.  Psychiatric/Behavioral: The patient is not nervous/anxious.   All other systems reviewed and are negative.     Physical Exam: Physical Exam Vitals and  nursing note reviewed.  Constitutional:      General: She is not in acute distress.    Appearance: She is well-developed.     Comments: Appears younger than stated age  HENT:     Head: Normocephalic and atraumatic.  Eyes:     Conjunctiva/sclera: Conjunctivae normal.     Pupils: Pupils are equal, round, and reactive to light.  Neck:     Vascular: No JVD.  Cardiovascular:     Rate and Rhythm: Normal rate and regular rhythm.     Pulses: Normal pulses and intact distal pulses.     Heart sounds: No murmur heard.   Pulmonary:     Effort: Pulmonary effort is normal.     Breath sounds: Normal breath sounds. No wheezing or rales.  Abdominal:     General: Bowel sounds are normal.     Palpations: Abdomen is soft.     Tenderness:  There is no rebound.  Musculoskeletal:        General: No tenderness. Normal range of motion.     Right lower leg: No edema.     Left lower leg: No edema.  Lymphadenopathy:     Cervical: No cervical adenopathy.  Skin:    General: Skin is warm and dry.  Neurological:     Mental Status: She is alert and oriented to person, place, and time.     Cranial Nerves: No cranial nerve deficit.      Labs:   Lab Results  Component Value Date   WBC 8.6 07/27/2020   HGB 11.2 (L) 07/27/2020   HCT 35.0 (L) 07/27/2020   MCV 95.4 07/27/2020   PLT 270 07/27/2020    Recent Labs  Lab 07/28/20 0933  NA 131*  K 3.9  CL 99  CO2 18*  BUN 16  CREATININE 1.30*  CALCIUM 9.4  GLUCOSE 262*    Lipid Panel  No results found for: CHOL, TRIG, HDL, CHOLHDL, VLDL, LDLCALC  BNP (last 3 results) Recent Labs    07/27/20 2311  BNP 65.9    HEMOGLOBIN A1C No results found for: HGBA1C, MPG  Cardiac Panel (last 3 results) Results for LYNNETTA, TOM (MRN [id number]) as of 07/28/2020 19:27  Ref. Range 07/28/2020 06:54 07/28/2020 09:33 07/28/2020 11:10 07/28/2020 12:50  Troponin I (High Sensitivity) Latest Ref Range: <18 ng/L 47 (H) 78 (H) 91 (H) 99 (H)      Radiology: DG  Chest 2 View  Result Date: 07/27/2020 CLINICAL DATA:  Respiratory distress EXAM: CHEST - 2 VIEW COMPARISON:  03/22/2020 FINDINGS: Cardiac shadow is stable. Pacing device is again seen. Aortic calcifications are noted. No bony abnormality is noted. Lungs are clear bilaterally. IMPRESSION: No acute abnormality noted. Electronically Signed   By: Alcide Clever M.D.   On: 07/27/2020 23:14   ECHOCARDIOGRAM COMPLETE  Result Date: 07/28/2020    ECHOCARDIOGRAM REPORT   Patient Name:   ASTELLA Amey Date of Exam: 07/28/2020 Medical Rec #:  [id number]    Height:       51.0 in Accession #:    [id number]   Weight:       137.0 lb Date of Birth:  [birth year]    BSA:          1.412 m Patient Age:    89 years     BP:           146/51 mmHg Patient Gender: F            HR:           87 bpm. Exam Location:  Inpatient Procedure: 2D Echo, Cardiac Doppler and Color Doppler Indications:    Elevated troponin  History:        Patient has no prior history of Echocardiogram examinations.                 COPD; Signs/Symptoms:Chest Pain.  Sonographer:    Roosvelt Maser RDCS Referring Phys: [id number] FANG XU IMPRESSIONS  1. Left ventricular ejection fraction, by estimation, is 65 to 70%. The left ventricle has normal function. The left ventricle has no regional wall motion abnormalities. There is mild concentric left ventricular hypertrophy. Left ventricular diastolic parameters are indeterminate.  2. Right ventricular systolic function is normal. The right ventricular size is normal.  3. Left atrial size was moderately dilated.  4. Right atrial size was mild to moderately dilated.  5. The mitral valve is abnormal. No evidence of mitral valve  regurgitation.  6. The aortic valve is tricuspid. There is moderate calcification of the aortic valve. Aortic valve regurgitation is not visualized. Mild aortic valve stenosis.  7. The inferior vena cava is normal in size with greater than 50% respiratory variability, suggesting right atrial pressure of 3 mmHg.  Comparison(s): A prior study was performed on 09/18/19. No significant change from prior study. Prior images reviewed side by side. FINDINGS  Left Ventricle: Left ventricular ejection fraction, by estimation, is 65 to 70%. The left ventricle has normal function. The left ventricle has no regional wall motion abnormalities. The left ventricular internal cavity size was small. There is mild concentric left ventricular hypertrophy. Left ventricular diastolic parameters are indeterminate. Right Ventricle: The right ventricular size is normal. No increase in right ventricular wall thickness. Right ventricular systolic function is normal. Left Atrium: Left atrial size was moderately dilated. Right Atrium: Right atrial size was mild to moderately dilated. Pericardium: There is no evidence of pericardial effusion. Mitral Valve: The mitral valve is abnormal. Mild mitral annular calcification. No evidence of mitral valve regurgitation. Tricuspid Valve: The tricuspid valve is normal in structure. Tricuspid valve regurgitation is not demonstrated. Aortic Valve: The aortic valve is tricuspid. There is moderate calcification of the aortic valve. Aortic valve regurgitation is not visualized. Mild aortic stenosis is present. Aortic valve mean gradient measures 8.0 mmHg. Aortic valve peak gradient measures 14.9 mmHg. Aortic valve area, by VTI measures 2.22 cm. Pulmonic Valve: The pulmonic valve was not well visualized. Pulmonic valve regurgitation is not visualized. No evidence of pulmonic stenosis. Aorta: The aortic root and ascending aorta are structurally normal, with no evidence of dilitation. Venous: The inferior vena cava is normal in size with greater than 50% respiratory variability, suggesting right atrial pressure of 3 mmHg. IAS/Shunts: No atrial level shunt detected by color flow Doppler. Additional Comments: A device lead is visualized.  LEFT VENTRICLE PLAX 2D LVIDd:         3.80 cm  Diastology LVIDs:         2.10 cm  LV  e' medial:    11.00 cm/s LV PW:         1.10 cm  LV E/e' medial:  11.9 LV IVS:        1.20 cm  LV e' lateral:   8.70 cm/s LVOT diam:     1.90 cm  LV E/e' lateral: 15.1 LV SV:         79 LV SV Index:   56 LVOT Area:     2.84 cm  RIGHT VENTRICLE RV Basal diam:  3.50 cm LEFT ATRIUM             Index       RIGHT ATRIUM           Index LA diam:        3.60 cm 2.55 cm/m  RA Area:     19.00 cm LA Vol (A2C):   70.0 ml 49.56 ml/m RA Volume:   57.80 ml  40.92 ml/m LA Vol (A4C):   56.1 ml 39.72 ml/m LA Biplane Vol: 62.7 ml 44.39 ml/m  AORTIC VALVE AV Area (Vmax):    2.07 cm AV Area (Vmean):   2.00 cm AV Area (VTI):     2.22 cm AV Vmax:           193.00 cm/s AV Vmean:          136.000 cm/s AV VTI:  0.354 m AV Peak Grad:      14.9 mmHg AV Mean Grad:      8.0 mmHg LVOT Vmax:         141.00 cm/s LVOT Vmean:        96.000 cm/s LVOT VTI:          0.277 m LVOT/AV VTI ratio: 0.78  AORTA Ao Root diam: 2.70 cm Ao Asc diam:  3.10 cm MITRAL VALVE                TRICUSPID VALVE MV Area (PHT): 4.36 cm     TR Peak grad:   42.2 mmHg MV Decel Time: 174 msec     TR Vmax:        325.00 cm/s MV E velocity: 131.00 cm/s MV A velocity: 182.00 cm/s  SHUNTS MV E/A ratio:  0.72         Systemic VTI:  0.28 m                             Systemic Diam: 1.90 cm Riley LamMahesh Chandrasekhar MD Electronically signed by Riley LamMahesh Chandrasekhar MD Signature Date/Time: 07/28/2020/5:30:59 PM    Final     Scheduled Meds: . amLODipine  10 mg Oral Daily  . aspirin EC  81 mg Oral Daily  . atorvastatin  80 mg Oral Daily  . calcium-vitamin D  1 tablet Oral BID WC  . doxycycline  100 mg Oral Q12H  . guaiFENesin  600 mg Oral BID  . insulin aspart  0-6 Units Subcutaneous TID WC  . ipratropium-albuterol  3 mL Nebulization Q8H  . loratadine  10 mg Oral Daily  . losartan  50 mg Oral Daily  . methylPREDNISolone (SOLU-MEDROL) injection  60 mg Intravenous Q12H  . mometasone-formoterol  2 puff Inhalation BID  . umeclidinium bromide  1 puff Inhalation  Daily   Continuous Infusions: . sodium chloride 75 mL/hr at 07/28/20 1434  . heparin 500 Units/hr (07/28/20 1438)   PRN Meds:.acetaminophen, albuterol, benzonatate  CARDIAC STUDIES:  EKG 07/28/2020: Atrial-sensed ventricular-paced rhythm  Echocardiogram 07/28/2020: 1. Left ventricular ejection fraction, by estimation, is 65 to 70%. The  left ventricle has normal function. The left ventricle has no regional  wall motion abnormalities. There is mild concentric left ventricular  hypertrophy. Left ventricular diastolic  parameters are indeterminate.  2. Right ventricular systolic function is normal. The right ventricular  size is normal.  3. Left atrial size was moderately dilated.  4. Right atrial size was mild to moderately dilated.  5. The mitral valve is abnormal. No evidence of mitral valve  regurgitation.  6. The aortic valve is tricuspid. There is moderate calcification of the  aortic valve. Aortic valve regurgitation is not visualized. Mild aortic  valve stenosis.  7. The inferior vena cava is normal in size with greater than 50%  respiratory variability, suggesting right atrial pressure of 3 mmHg.     Assessment & Recommendations:  87 y.o. African American female  with hypertension, mixed hyperlipidemia, former smoker, s/p Medtronic pacemaker placement in 2015 in [place], admitted with shortness of breath. Cardiology consulted given an episode of chest pain and troponin elevation.  Chest pain, troponin elevation: Atypical chest pain, likely related to her vomiting episode. Rising, yet mildly elevated troponin likely secondary to supply demand mismatch or type II MI in the setting of asthma exacerbation. With advanced age, coronary artery disease cannot be excluded.  However, her presentation is not  typical of acute coronary syndrome.  Given her advanced age, absence of true angina symptoms, wall motion abnormalities on echocardiogram, and presence of which are  suggestive of asthma exacerbation, conservative management is perfectly reasonable.  In absence of bleeding contraindication okay to continue heparin for 48 hours.  Continue aspirin, Lipitor, baseline antihypertensive medical management.    We will arrange outpatient follow-up with Dr. Odis Hollingshead on discharge.    Elder Negus, MD Pager: [phone] Office: [phone]

## 2020-07-28 NOTE — Evaluation (Signed)
Physical Therapy Evaluation Patient Details Name: Sarah Burns MRN: [id number] DOB: 1924-06-27 Today's Date: 07/28/2020   History of Present Illness  87 yo female presents to ED with c/o SOB, wheezing, cough.  Symptoms onset this past day, worsening, severe, not helped by home inhaler. medical history significant of asthma, CHB s/p PPM, HTN, HLD.  Clinical Impression   Pt admitted with above diagnosis. Lives at home with her daughter who works days in a single level home with 3 steps to enter; Pt reports being independent with amb at home, occasionally using a cane for amb; Occasionally needing assist with ADLs; Presents to PT with generalized weakness, decr activity tolerance, occasional wheezing with activity;  Pt currently with functional limitations due to the deficits listed below (see PT Problem List). Pt will benefit from skilled PT to increase their independence and safety with mobility to allow discharge to the venue listed below.       Follow Up Recommendations Home health PT    Equipment Recommendations  Rolling walker with 5" wheels;Other (comment) (Does pt ahve a rollator?)    Recommendations for Other Services       Precautions / Restrictions Precautions Precautions: Fall Precaution Comments: Fall risk decreased with use of RW Restrictions Weight Bearing Restrictions: No      Mobility  Bed Mobility Overal bed mobility: Needs Assistance Bed Mobility: Supine to Sit     Supine to sit: Min guard;Min assist     General bed mobility comments: Used rails to pull to sit; Cues to scoot to EOB and get feet to the floor; occasional min assist due to trunk slowly drifting into mild posterior lean    Transfers Overall transfer level: Needs assistance Equipment used: 2 person hand held assist Transfers: Sit to/from Stand Sit to Stand: Min assist         General transfer comment: Min assist to steady  Ambulation/Gait Ambulation/Gait assistance: Min assist    Assistive device: 2 person hand held assist;Rolling walker (2 wheeled) Gait Pattern/deviations: Step-through pattern;Decreased step length - right;Decreased step length - left;Decreased stride length Gait velocity: quite slow   General Gait Details: Naturally reaching out for bilateral UE assist; initiated with 2 person handheld assist, and swithched to RW after bathroom activities  Stairs            Wheelchair Mobility    Modified Rankin (Stroke Patients Only)       Balance Overall balance assessment: Needs assistance Sitting-balance support: No upper extremity supported;Feet supported Sitting balance-Leahy Scale: Fair (Once her feet are on the floor) Sitting balance - Comments: inital posterior lean until feet stablized   Standing balance support: Bilateral upper extremity supported;During functional activity;No upper extremity supported Standing balance-Leahy Scale: Poor (approaching fair) Standing balance comment: Stood at sink, and able to participate in ADLs, tending to prop at least one hand on counter; Tends to reach out for UE suport with amb                             Pertinent Vitals/Pain Pain Assessment: No/denies pain    Home Living Family/patient expects to be discharged to:: Private residence Living Arrangements: Children (daughter) Available Help at Discharge: Family;Available 24 hours/day Type of Home: House Home Access: Stairs to enter Entrance Stairs-Rails: Right;Left;Can reach both Entrance Stairs-Number of Steps: 3 Home Layout: One level Home Equipment: Walker - 2 wheels;Walker - 4 wheels;Cane - single point;Hand held shower head;Grab bars - tub/shower;Shower seat  Prior Function Level of Independence: Independent         Comments: pt reports using cane at times, reports daughter can assist as needed (but not always needing help)     Hand Dominance   Dominant Hand: Right    Extremity/Trunk Assessment   Upper  Extremity Assessment Upper Extremity Assessment: Defer to OT evaluation    Lower Extremity Assessment Lower Extremity Assessment: Generalized weakness       Communication   Communication: No difficulties  Cognition Arousal/Alertness: Awake/alert Behavior During Therapy: WFL for tasks assessed/performed Overall Cognitive Status: Within Functional Limits for tasks assessed (For simple mobility tasks)                                 General Comments: WFL for tasks assessed      General Comments General comments (skin integrity, edema, etc.): VSS on RA, slight wheezing noted at times; HR ranged from 90-120.    Exercises     Assessment/Plan    PT Assessment Patient needs continued PT services  PT Problem List Decreased strength;Decreased activity tolerance;Decreased balance;Decreased mobility;Decreased coordination;Decreased knowledge of use of DME;Decreased safety awareness;Decreased knowledge of precautions;Cardiopulmonary status limiting activity       PT Treatment Interventions DME instruction;Gait training;Stair training;Functional mobility training;Therapeutic activities;Therapeutic exercise;Balance training;Patient/family education    PT Goals (Current goals can be found in the Care Plan section)  Acute Rehab PT Goals Patient Stated Goal: get better PT Goal Formulation: With patient Time For Goal Achievement: 08/18/20 Potential to Achieve Goals: Good    Frequency Min 3X/week   Barriers to discharge        Co-evaluation PT/OT/SLP Co-Evaluation/Treatment:  ([place])             AM-PAC PT "6 Clicks" Mobility  Outcome Measure Help needed turning from your back to your side while in a flat bed without using bedrails?: A Little Help needed moving from lying on your back to sitting on the side of a flat bed without using bedrails?: A Little Help needed moving to and from a bed to a chair (including a wheelchair)?: A Little Help needed standing up  from a chair using your arms (e.g., wheelchair or bedside chair)?: A Little Help needed to walk in hospital room?: A Lot Help needed climbing 3-5 steps with a railing? : A Little 6 Click Score: 17    End of Session   Activity Tolerance: Patient tolerated treatment well Patient left: in chair;with call bell/phone within reach;with chair alarm set Nurse Communication: Mobility status PT Visit Diagnosis: Unsteadiness on feet (R26.81)    Time: 0945-1000 PT Time Calculation (min) (ACUTE ONLY): 15 min   Charges:   PT Evaluation $PT Eval Moderate Complexity: 1 Mod          Van Clines, [place]  Acute Rehabilitation Services Pager [phone] Office [phone]'   Levi Aland 07/28/2020, 2:53 PM

## 2020-07-29 DIAGNOSIS — J45901 Unspecified asthma with (acute) exacerbation: Secondary | ICD-10-CM | POA: Diagnosis not present

## 2020-07-29 LAB — CBC WITH DIFFERENTIAL/PLATELET
Abs Immature Granulocytes: 0.07 10*3/uL (ref 0.00–0.07)
Basophils Absolute: 0 10*3/uL (ref 0.0–0.1)
Basophils Relative: 0 %
Eosinophils Absolute: 0 10*3/uL (ref 0.0–0.5)
Eosinophils Relative: 0 %
HCT: 27.5 % — ABNORMAL LOW (ref 36.0–46.0)
Hemoglobin: 9.1 g/dL — ABNORMAL LOW (ref 12.0–15.0)
Immature Granulocytes: 1 %
Lymphocytes Relative: 9 %
Lymphs Abs: 1.1 10*3/uL (ref 0.7–4.0)
MCH: 31 pg (ref 26.0–34.0)
MCHC: 33.1 g/dL (ref 30.0–36.0)
MCV: 93.5 fL (ref 80.0–100.0)
Monocytes Absolute: 0.3 10*3/uL (ref 0.1–1.0)
Monocytes Relative: 2 %
Neutro Abs: 11.2 10*3/uL — ABNORMAL HIGH (ref 1.7–7.7)
Neutrophils Relative %: 88 %
Platelets: 212 10*3/uL (ref 150–400)
RBC: 2.94 MIL/uL — ABNORMAL LOW (ref 3.87–5.11)
RDW: 13.2 % (ref 11.5–15.5)
WBC: 12.7 10*3/uL — ABNORMAL HIGH (ref 4.0–10.5)
nRBC: 0 % (ref 0.0–0.2)

## 2020-07-29 LAB — GLUCOSE, CAPILLARY
Glucose-Capillary: 159 mg/dL — ABNORMAL HIGH (ref 70–99)
Glucose-Capillary: 129 mg/dL — ABNORMAL HIGH (ref 70–99)

## 2020-07-29 LAB — HEMOGLOBIN A1C
Hgb A1c MFr Bld: 5.6 % (ref 4.8–5.6)
Mean Plasma Glucose: 114 mg/dL

## 2020-07-29 LAB — HEPARIN LEVEL (UNFRACTIONATED)
Heparin Unfractionated: 0.43 IU/mL (ref 0.30–0.70)
Heparin Unfractionated: 0.41 IU/mL (ref 0.30–0.70)

## 2020-07-29 LAB — BASIC METABOLIC PANEL
Anion gap: 9 (ref 5–15)
BUN: 17 mg/dL (ref 8–23)
CO2: 22 mmol/L (ref 22–32)
Calcium: 9.9 mg/dL (ref 8.9–10.3)
Chloride: 99 mmol/L (ref 98–111)
Creatinine, Ser: 1 mg/dL (ref 0.44–1.00)
GFR, Estimated: 53 mL/min — ABNORMAL LOW (ref >60)
Glucose, Bld: 151 mg/dL — ABNORMAL HIGH (ref 70–99)
Potassium: 4.5 mmol/L (ref 3.5–5.1)
Sodium: 130 mmol/L — ABNORMAL LOW (ref 135–145)

## 2020-07-29 LAB — TROPONIN I (HIGH SENSITIVITY): Troponin I (High Sensitivity): 104 ng/L (ref <18)

## 2020-07-29 MED ORDER — IPRATROPIUM-ALBUTEROL 0.5-2.5 (3) MG/3ML IN SOLN: 3.0000 mL | Freq: Four times a day (QID) | RESPIRATORY_TRACT | 1 refills | Status: DC | PRN

## 2020-07-29 MED ORDER — ALBUTEROL SULFATE HFA 108 (90 BASE) MCG/ACT IN AERS: 2.0000 | INHALATION_SPRAY | Freq: Four times a day (QID) | RESPIRATORY_TRACT | 1 refills | Status: DC | PRN

## 2020-07-29 MED ORDER — PREDNISONE 20 MG PO TABS: 40.0000 mg | ORAL_TABLET | Freq: Every day | ORAL | 0 refills | Status: AC

## 2020-07-29 MED ORDER — DOXYCYCLINE HYCLATE 100 MG PO TABS: 100.0000 mg | ORAL_TABLET | Freq: Two times a day (BID) | ORAL | 0 refills | Status: AC

## 2020-07-29 MED ORDER — GUAIFENESIN ER 600 MG PO TB12: 600.0000 mg | ORAL_TABLET | Freq: Two times a day (BID) | ORAL | 0 refills | Status: AC | PRN

## 2020-07-29 NOTE — TOC Transition Note (Incomplete)
Transition of Care [facility]) - CM/SW Discharge Note   Patient Details  Name: Sarah Burns MRN: [id number] Date of Birth: 10-17-1924  Transition of Care [facility]) CM/SW Contact:  Epifanio Lesches, RN Phone Number: 07/29/2020, 4:28 PM   Clinical Narrative:     Patient will DC to: home Anticipated DC date:07/29/2020 Family notified: yes Transport by: car   Per MD patient ready for DC today . RN, patient, patient's family, and facility notified of DC. Discharge Summary and FL2 sent to facility. RN to call report prior to discharge (). DC packet on chart. Ambulance transport requested for patient.   RNCM will sign off for now as intervention is no longer needed. Please consult [place] again if new needs arise.   Final next level of care: Home w Home Health Services Barriers to Discharge: No Barriers Identified   Patient Goals and CMS Choice        Discharge Placement                       Discharge Plan and Services                DME Arranged: Nebulizer machine DME Agency: [employer] Date DME Agency Contacted: 07/29/20 Time DME Agency Contacted: 1620 Representative spoke with at DME Agency: Velna Hatchet HH Arranged: PT,OT HH Agency: [facility] (formerly [employer]support[employer]administrative officer Home Health) [facility]) Date [facility] Agency Contacted: 07/29/20 Time HH Agency Contacted: 1621 Representative spoke with at [facility] Agency: Misty Stanley  Social Determinants of Health (SDOH) Interventions     Readmission Risk Interventions Readmission Risk Prevention Plan 09/20/2019  Transportation Screening Complete  PCP or Specialist Appt within 5-7 Days Complete  Home Care Screening Complete  Medication Review (RN CM) Referral to Pharmacy

## 2020-07-29 NOTE — Progress Notes (Signed)
Nutrition Brief Note  Patient identified on the Malnutrition Screening Tool (MST) Report  Wt Readings from Last 15 Encounters:  07/27/20 62.1 kg  05/21/20 63.3 kg  04/08/20 61.7 kg  03/11/20 62.1 kg  09/19/19 54.4 kg   Sarah Burns is a 87 y.o. female with medical history significant of asthma, CHB s/p PPM, HTN, HLD.  Pt admitted with asthma exacerbation.   Reviewed I/O's: -1.1 L x 24 hours and -810 ml since admission  UOP: 1.1 L x 24 hours  Spoke with pt at bedside, who was pleasant and in good spirits today. She shares that she has a good appetite, consuming 100% of her breakfast this morning. PTA she consumes 3 meals per day (Breakfast: cold cereal or yogurt and fruit; Lunch: sandwich; Dinner: meat, starch, and vegetable).   Pt denies any weight loss. She reports mild wt gain due to being less active during the pandemic.   Nutrition-Focused physical exam completed. Findings are no fat depletion, no muscle depletion, and no edema.   Medications reviewed and include calcium with vitamin D and IV solu-medrol.   No results found for: HGBA1C PTA DM medications are none.   Labs reviewed: CBGS: 159-171 (inpatient orders for glycemic control are 0-6 units insulin aspart TID with meals).   Body mass index is 37.03 kg/m. Patient meets criteria for obesity, class II based on current BMI.   Current diet order is carb modified, patient is consuming approximately 100% of meals at this time. Labs and medications reviewed.   No nutrition interventions warranted at this time. If nutrition issues arise, please consult RD.   Levada Schilling, RD, LDN, CDCES Registered Dietitian II Certified Diabetes Care and Education Specialist Please refer to [facility] for RD and/or RD on-call/weekend/after hours pager

## 2020-07-29 NOTE — Progress Notes (Signed)
Occupational Therapy Treatment Patient Details Name: Sarah Burns MRN: [id number] DOB: Jan 19, 1925 Today's Date: 07/29/2020    History of present illness 87 yo female presents to ED with c/o SOB, wheezing, cough.  Symptoms onset this past day, worsening, severe, not helped by home inhaler. medical history significant of asthma, CHB s/p PPM, HTN, HLD.   OT comments  Sarah Burns is progressing towards her goals with d/c plans for today. Pt completed functional mobility within her room with rw and min guard A, pt required several cues for safety awareness. Pt completed sit<>stand trasnfers with min guard and verbal cues for appropriate hand placement.  Pt able to complete all ADLs at a min guard/supervision level for safety and impulsivity. HHOT discharge recommendation remains appropriate, supervision 24/7 initially for safety.    Follow Up Recommendations  Home health OT;Supervision/Assistance - 24 hour    Equipment Recommendations  3 in 1 bedside commode       Precautions / Restrictions Precautions Precautions: Fall Precaution Comments: Fall risk decreased with use of RW Restrictions Weight Bearing Restrictions: No          Balance Overall balance assessment: Needs assistance Sitting-balance support: No upper extremity supported;Feet supported Sitting balance-Leahy Scale: Fair Sitting balance - Comments: inital posterior lean until feet stablized   Standing balance support: Bilateral upper extremity supported;During functional activity;No upper extremity supported Standing balance-Leahy Scale: Poor                             ADL either performed or assessed with clinical judgement   ADL Overall ADL's : Needs assistance/impaired Eating/Feeding: Set up;Sitting   Grooming: Supervision/safety;Cueing for safety;Sitting   Upper Body Bathing: Set up;Sitting       Upper Body Dressing : Set up;Sitting       Toilet Transfer: Min guard;Ambulation;RW            Functional mobility during ADLs: Min guard;Rolling walker;Cueing for safety General ADL Comments: Pt with improved function in ADLs this session, pt required cueing for safety awareness               Cognition Arousal/Alertness: Awake/alert Behavior During Therapy: WFL for tasks assessed/performed Overall Cognitive Status: Within Functional Limits for tasks assessed                General Comments pt wtih no apparent skin integrity issues    Pertinent Vitals/ Pain       Pain Assessment: No/denies pain         Frequency  Min 2X/week        Progress Toward Goals  OT Goals(current goals can now be found in the care plan section)  Progress towards OT goals: Progressing toward goals  Acute Rehab OT Goals Patient Stated Goal: get better OT Goal Formulation: With patient Time For Goal Achievement: 08/11/20 Potential to Achieve Goals: Good ADL Goals Pt Will Perform Grooming: with modified independence;standing Pt Will Perform Lower Body Dressing: with supervision;sit to/from stand Pt Will Transfer to Toilet: with modified independence;ambulating Pt Will Perform Toileting - Clothing Manipulation and hygiene: with modified independence;sit to/from stand  Plan Discharge plan remains appropriate       AM-PAC OT "6 Clicks" Daily Activity     Outcome Measure   Help from another person eating meals?: None Help from another person taking care of personal grooming?: A Little Help from another person toileting, which includes using toliet, bedpan, or urinal?: A Little Help from another person bathing (  including washing, rinsing, drying)?: A Little Help from another person to put on and taking off regular upper body clothing?: A Little Help from another person to put on and taking off regular lower body clothing?: A Little 6 Click Score: 19    End of Session Equipment Utilized During Treatment: Rolling walker;Gait belt  OT Visit Diagnosis: Other abnormalities of gait  and mobility (R26.89);Muscle weakness (generalized) (M62.81)   Activity Tolerance Patient tolerated treatment well   Patient Left in chair;with call bell/phone within reach;with chair alarm set   Nurse Communication Mobility status (pt vitals)        Time: 1441 [phone] OT Time Calculation (min): 24 min  Charges: OT General Charges $OT Visit: 1 Visit OT Treatments $Self Care/Home Management : 23-37 mins    Sarah Burns A Cacie Gaskins 07/29/2020, 3:16 PM

## 2020-07-29 NOTE — Progress Notes (Signed)
Pt was given her AVS discharge summary and went over with her. IV was removed with catheter intact. Pt daughter transporting her home. Nebulizer machine needs to be delivered to room and then patient will be ready for discharge. Pt had no further questions.

## 2020-07-29 NOTE — Progress Notes (Signed)
ANTICOAGULATION CONSULT NOTE   Pharmacy Consult for heparin Indication: troponin elevation  Allergies  Allergen Reactions  . Lactose Intolerance (Gi) Nausea And Vomiting and Other (See Comments)    Stomach pain  . Penicillins Other (See Comments)    Unknown reaction     Patient Measurements: Height: 4\' 3"  (129.5 cm) Weight: 62.1 kg (137 lb) IBW/kg (Calculated) : 24.8 Heparin Dosing Weight: 40.3 kg  Vital Signs: Temp: 98.3 F (36.8 C) (04/17 2043) Temp Source: Oral (04/17 2043) BP: 132/70 (04/17 2043) Pulse Rate: 84 (04/17 2043)  Labs: Recent Labs    07/27/20 2245 07/28/20 0230 07/28/20 0933 07/28/20 1110 07/28/20 1250 07/29/20 0111  HGB 11.2*  --   --   --   --  9.1*  HCT 35.0*  --   --   --   --  27.5*  PLT 270  --   --   --   --  212  HEPARINUNFRC  --   --   --   --   --  0.43  CREATININE 1.27*  --  1.30*  --   --   --   TROPONINIHS  --    < > 78* 91* 99*  --    < > = values in this interval not displayed.    Estimated Creatinine Clearance: 16.9 mL/min (A) (by C-G formula based on SCr of 1.3 mg/dL (H)).  Assessment: 79 yoF admitted for asthma exacerbation. Found to have troponin elevation 47>78>91. No PTA oral anticoagulation. Pharmacy consulted to dose heparin.  Heparin level therapeutic (0.43) on gtt at 500 units/hr. No bleeding noted.  Per cards note, conservative management and continue heparin x 48 hours.   Goal of Therapy:  Heparin level 0.3-0.7 units/ml Monitor platelets by anticoagulation protocol: Yes   Plan:  Continue Heparin 500 units/hr (will add 48hr stop time per Dr. 83) 6 hr heparin level to confirm therapeutic  Joaquim Nam, PharmD, BCPS Please see amion for complete clinical pharmacist phone list 07/29/2020,1:53 AM

## 2020-07-29 NOTE — Progress Notes (Signed)
ANTICOAGULATION CONSULT NOTE  Pharmacy Consult for heparin Indication: troponin elevation  Allergies  Allergen Reactions  . Lactose Intolerance (Gi) Nausea And Vomiting and Other (See Comments)    Stomach pain  . Penicillins Other (See Comments)    Unknown reaction     Patient Measurements: Height: 4\' 3"  (129.5 cm) Weight: 62.1 kg (137 lb) IBW/kg (Calculated) : 24.8 Heparin Dosing Weight: 40.3 kg  Vital Signs: Temp: 97.6 F (36.4 C) (04/18 0937) Temp Source: Oral (04/18 0937) BP: 147/53 (04/18 0937) Pulse Rate: 88 (04/18 0937)  Labs: Recent Labs    07/27/20 2245 07/28/20 0230 07/28/20 0933 07/28/20 1110 07/28/20 1250 07/29/20 0111 07/29/20 0823  HGB 11.2*  --   --   --   --  9.1*  --   HCT 35.0*  --   --   --   --  27.5*  --   PLT 270  --   --   --   --  212  --   HEPARINUNFRC  --   --   --   --   --  0.43 0.41  CREATININE 1.27*  --  1.30*  --   --  1.00  --   TROPONINIHS  --    < > 78* 91* 99*  --  104*   < > = values in this interval not displayed.    Estimated Creatinine Clearance: 22 mL/min (by C-G formula based on SCr of 1 mg/dL).  Assessment: 45 yoF admitted for asthma exacerbation. Found to have troponin elevation and Pharmacy consulted to dose heparin.  Heparin level is therapeutic and stable.  Noted drop in hemoglobin/hematocrit; no bleeding per RN.  Per cards note, conservative management and continue heparin x 48 hours.   Goal of Therapy:  Heparin level 0.3-0.7 units/ml Monitor platelets by anticoagulation protocol: Yes   Plan:  Heparin gtt for elevated troponin for 48 hours - end 4/19 at 1330 Continue heparin infusion at 500 units/hr  Monitor daily heparin level, CBC, s/sx of bleeding  Ronnica Dreese D. 05-20-1980, PharmD, BCPS, BCCCP 07/29/2020, 9:55 AM

## 2020-07-29 NOTE — Plan of Care (Signed)
  Problem: Education: Goal: Knowledge of General Education information will improve Description: Including pain rating scale, medication(s)/side effects and non-pharmacologic comfort measures Outcome: Adequate for Discharge   Problem: Health Behavior/Discharge Planning: Goal: Ability to manage health-related needs will improve Outcome: Adequate for Discharge   Problem: Clinical Measurements: Goal: Ability to maintain clinical measurements within normal limits will improve Outcome: Adequate for Discharge Goal: Will remain free from infection Outcome: Adequate for Discharge Goal: Diagnostic test results will improve Outcome: Adequate for Discharge Goal: Respiratory complications will improve Outcome: Adequate for Discharge Goal: Cardiovascular complication will be avoided Outcome: Adequate for Discharge   Problem: Activity: Goal: Risk for activity intolerance will decrease Outcome: Adequate for Discharge   Problem: Nutrition: Goal: Adequate nutrition will be maintained Outcome: Adequate for Discharge   Problem: Coping: Goal: Level of anxiety will decrease Outcome: Adequate for Discharge   Problem: Elimination: Goal: Will not experience complications related to bowel motility Outcome: Adequate for Discharge Goal: Will not experience complications related to urinary retention Outcome: Adequate for Discharge   Problem: Pain Managment: Goal: General experience of comfort will improve Outcome: Adequate for Discharge   Problem: Safety: Goal: Ability to remain free from injury will improve Outcome: Adequate for Discharge   Problem: Skin Integrity: Goal: Risk for impaired skin integrity will decrease Outcome: Adequate for Discharge   Problem: Acute Rehab PT Goals(only PT should resolve) Goal: Pt Will Go Supine/Side To Sit Outcome: Adequate for Discharge Goal: Pt Will Go Sit To Supine/Side Outcome: Adequate for Discharge Goal: Patient Will Transfer Sit To/From  Stand Outcome: Adequate for Discharge Goal: Pt Will Ambulate Outcome: Adequate for Discharge Goal: Pt Will Go Up/Down Stairs Outcome: Adequate for Discharge   Problem: Acute Rehab OT Goals (only OT should resolve) Goal: Pt. Will Perform Grooming Outcome: Adequate for Discharge Goal: Pt. Will Perform Lower Body Dressing Outcome: Adequate for Discharge Goal: Pt. Will Transfer To Toilet Outcome: Adequate for Discharge Goal: Pt. Will Perform Toileting-Clothing Manipulation Outcome: Adequate for Discharge

## 2020-07-29 NOTE — Discharge Summary (Signed)
Physician Discharge Summary  Sarah MouldsLoretta Burns ZOX:[id number] DOB: [birth year] DOA: 07/27/2020  PCP: Renaye RakersBland, Veita, MD  Admit date: 07/27/2020 Discharge date: 07/29/2020  Admitted From: Home Disposition:  Home  Discharge Condition:Stable CODE STATUS: DNR Diet recommendation: Heart HealthyDysphagia   Brief/Interim Summary: Sarah MouldsLoretta Burns is a 87 y.o. female with medical history significant of asthma, CHB s/p PPM, HTN, HLD who presented to ED with c/o SOB, wheezing, cough.  Symptoms were severe, not helped by home inhaler. No fevers, chills.  She also developed some chest pain in the emergency department.  Chest pain was atypical.  Troponins were mildly elevated.  Cardiology consulted, she was started on heparin drip which was later stopped as per cardiology recommendation.  Cardiology thought that the elevated troponin is most likely secondary to supply demand ischemia/type II MI from asthma exacerbation and recommended outpatient follow-up.  She is already on aspirin, Lipitor.  Her echocardiogram did not show any wall motion abnormality, normal ejection fraction. Her chest pain has resolved.  She was also started on IV steroids for persistent wheezing.  This morning she was hemodynamically stable, saturating fine on room air.  She has mild expiratory wheezes bilaterally, her cough is significantly improved. Patient seen by PT and OT and recommended home health.  She is medically stable for discharge to home today with oral steroids. as per the daughter's request, we will also put her on nebulization machine.  We have requested for arranging home health.  I called her daughter and discussed about my discharge plan.  We recommend to follow-up with her PCP in a week.   Discharge Diagnoses:  Principal Problem:   Asthma exacerbation Active Problems:   Hypertension   Pacemaker Dual chamber Medtronic Advisa DR MRI SureScan 01/29/2014    Discharge Instructions  Discharge Instructions    Diet - low  sodium heart healthy   Complete by: As directed    Discharge instructions   Complete by: As directed    1)Please take prescribed medication as instructed 2)Follow up with your PCP in a week.  Do a CBC, BMP tests during the  follow-up 3)Follow up with home health 4)You will be called by cardiology for follow-up appointment.   Increase activity slowly   Complete by: As directed      Allergies as of 07/29/2020      Reactions   Lactose Intolerance (gi) Nausea And Vomiting, Other (See Comments)   Stomach pain   Penicillins Other (See Comments)   Unknown reaction      Medication List    TAKE these medications   acetaminophen 500 MG tablet Commonly known as: TYLENOL Take 500 mg by mouth daily as needed for headache (pain).   amLODipine 10 MG tablet Commonly known as: NORVASC Take 10 mg by mouth daily.   aspirin EC 81 MG tablet Take 1 tablet (81 mg total) by mouth daily. Swallow whole.   atorvastatin 80 MG tablet Commonly known as: LIPITOR Take 80 mg by mouth daily.   CALCIUM 600-D PO Take 1 tablet by mouth 2 (two) times daily.   cetirizine 10 MG tablet Commonly known as: ZYRTEC Take 10 mg by mouth daily as needed for allergies.   diclofenac Sodium 1 % Gel Commonly known as: VOLTAREN Apply 1 application topically 2 (two) times daily as needed (knee pain).   doxycycline 100 MG tablet Commonly known as: VIBRA-TABS Take 1 tablet (100 mg total) by mouth every 12 (twelve) hours for 3 days. Start taking on: July 30, 2020   fluticasone-salmeterol  115-21 MCG/ACT inhaler Commonly known as: ADVAIR HFA Inhale 2 puffs into the lungs 2 (two) times daily as needed (shortness of breath/wheezing).   guaiFENesin 600 MG 12 hr tablet Commonly known as: MUCINEX Take 1 tablet (600 mg total) by mouth 2 (two) times daily as needed.   ipratropium-albuterol 0.5-2.5 (3) MG/3ML Soln Commonly known as: DUONEB Take 3 mLs by nebulization every 6 (six) hours as needed.   LACTAID PO Take 1  tablet by mouth as needed (when consuming dairy).   losartan 50 MG tablet Commonly known as: COZAAR Take 50 mg by mouth daily.   montelukast 10 MG tablet Commonly known as: SINGULAIR Take 10 mg by mouth at bedtime.   OVER THE COUNTER MEDICATION Take 1 tablet by mouth daily. Super beets   predniSONE 20 MG tablet Commonly known as: DELTASONE Take 2 tablets (40 mg total) by mouth daily for 5 days. Start taking on: July 30, 2020   psyllium 58.6 % powder Commonly known as: METAMUCIL Take 1 packet by mouth daily as needed (mild constipation).            Durable Medical Equipment  (From admission, onward)         Start     Ordered   07/29/20 1340  For home use only DME Nebulizer machine  Once       Question Answer Comment  Patient needs a nebulizer to treat with the following condition COPD exacerbation (HCC)   Length of Need Lifetime      07/29/20 1339          Follow-up Information    Renaye Rakers, MD. Schedule an appointment as soon as possible for a visit in 1 week(s).   Specialty: Family Medicine Contact information: [address] [place] [address] [phone]              Allergies  Allergen Reactions  . Lactose Intolerance (Gi) Nausea And Vomiting and Other (See Comments)    Stomach pain  . Penicillins Other (See Comments)    Unknown reaction     Consultations:  Cardiology   Procedures/Studies: DG Chest 2 View  Result Date: 07/27/2020 CLINICAL DATA:  Respiratory distress EXAM: CHEST - 2 VIEW COMPARISON:  03/22/2020 FINDINGS: Cardiac shadow is stable. Pacing device is again seen. Aortic calcifications are noted. No bony abnormality is noted. Lungs are clear bilaterally. IMPRESSION: No acute abnormality noted. Electronically Signed   By: Alcide Clever M.D.   On: 07/27/2020 23:14   ECHOCARDIOGRAM COMPLETE  Result Date: 07/28/2020    ECHOCARDIOGRAM REPORT   Patient Name:   Sarah Burns Date of Exam: 07/28/2020 Medical Rec #:   [id number]    Height:       51.0 in Accession #:    [id number]   Weight:       137.0 lb Date of Birth:  July 14, 1924    BSA:          1.412 m Patient Age:    87 years     BP:           146/51 mmHg Patient Gender: F            HR:           87 bpm. Exam Location:  Inpatient Procedure: 2D Echo, Cardiac Doppler and Color Doppler Indications:    Elevated troponin  History:        Patient has no prior history of Echocardiogram examinations.  COPD; Signs/Symptoms:Chest Pain.  Sonographer:    Roosvelt Maser RDCS Referring Phys: [id number] FANG XU IMPRESSIONS  1. Left ventricular ejection fraction, by estimation, is 65 to 70%. The left ventricle has normal function. The left ventricle has no regional wall motion abnormalities. There is mild concentric left ventricular hypertrophy. Left ventricular diastolic parameters are indeterminate.  2. Right ventricular systolic function is normal. The right ventricular size is normal.  3. Left atrial size was moderately dilated.  4. Right atrial size was mild to moderately dilated.  5. The mitral valve is abnormal. No evidence of mitral valve regurgitation.  6. The aortic valve is tricuspid. There is moderate calcification of the aortic valve. Aortic valve regurgitation is not visualized. Mild aortic valve stenosis.  7. The inferior vena cava is normal in size with greater than 50% respiratory variability, suggesting right atrial pressure of 3 mmHg. Comparison(s): A prior study was performed on 09/18/19. No significant change from prior study. Prior images reviewed side by side. FINDINGS  Left Ventricle: Left ventricular ejection fraction, by estimation, is 65 to 70%. The left ventricle has normal function. The left ventricle has no regional wall motion abnormalities. The left ventricular internal cavity size was small. There is mild concentric left ventricular hypertrophy. Left ventricular diastolic parameters are indeterminate. Right Ventricle: The right ventricular size is  normal. No increase in right ventricular wall thickness. Right ventricular systolic function is normal. Left Atrium: Left atrial size was moderately dilated. Right Atrium: Right atrial size was mild to moderately dilated. Pericardium: There is no evidence of pericardial effusion. Mitral Valve: The mitral valve is abnormal. Mild mitral annular calcification. No evidence of mitral valve regurgitation. Tricuspid Valve: The tricuspid valve is normal in structure. Tricuspid valve regurgitation is not demonstrated. Aortic Valve: The aortic valve is tricuspid. There is moderate calcification of the aortic valve. Aortic valve regurgitation is not visualized. Mild aortic stenosis is present. Aortic valve mean gradient measures 8.0 mmHg. Aortic valve peak gradient measures 14.9 mmHg. Aortic valve area, by VTI measures 2.22 cm. Pulmonic Valve: The pulmonic valve was not well visualized. Pulmonic valve regurgitation is not visualized. No evidence of pulmonic stenosis. Aorta: The aortic root and ascending aorta are structurally normal, with no evidence of dilitation. Venous: The inferior vena cava is normal in size with greater than 50% respiratory variability, suggesting right atrial pressure of 3 mmHg. IAS/Shunts: No atrial level shunt detected by color flow Doppler. Additional Comments: A device lead is visualized.  LEFT VENTRICLE PLAX 2D LVIDd:         3.80 cm  Diastology LVIDs:         2.10 cm  LV e' medial:    11.00 cm/s LV PW:         1.10 cm  LV E/e' medial:  11.9 LV IVS:        1.20 cm  LV e' lateral:   8.70 cm/s LVOT diam:     1.90 cm  LV E/e' lateral: 15.1 LV SV:         79 LV SV Index:   56 LVOT Area:     2.84 cm  RIGHT VENTRICLE RV Basal diam:  3.50 cm LEFT ATRIUM             Index       RIGHT ATRIUM           Index LA diam:        3.60 cm 2.55 cm/m  RA Area:     19.00 cm  LA Vol (A2C):   70.0 ml 49.56 ml/m RA Volume:   57.80 ml  40.92 ml/m LA Vol (A4C):   56.1 ml 39.72 ml/m LA Biplane Vol: 62.7 ml 44.39 ml/m   AORTIC VALVE AV Area (Vmax):    2.07 cm AV Area (Vmean):   2.00 cm AV Area (VTI):     2.22 cm AV Vmax:           193.00 cm/s AV Vmean:          136.000 cm/s AV VTI:            0.354 m AV Peak Grad:      14.9 mmHg AV Mean Grad:      8.0 mmHg LVOT Vmax:         141.00 cm/s LVOT Vmean:        96.000 cm/s LVOT VTI:          0.277 m LVOT/AV VTI ratio: 0.78  AORTA Ao Root diam: 2.70 cm Ao Asc diam:  3.10 cm MITRAL VALVE                TRICUSPID VALVE MV Area (PHT): 4.36 cm     TR Peak grad:   42.2 mmHg MV Decel Time: 174 msec     TR Vmax:        325.00 cm/s MV E velocity: 131.00 cm/s MV A velocity: 182.00 cm/s  SHUNTS MV E/A ratio:  0.72         Systemic VTI:  0.28 m                             Systemic Diam: 1.90 cm Riley Lam MD Electronically signed by Riley Lam MD Signature Date/Time: 07/28/2020/5:30:59 PM    Final        Subjective: Patient seen and examined at bedside this morning.  Hemodynamically stable for discharge.  Discharge Exam: Vitals:   07/29/20 0708 07/29/20 0937  BP:  (!) 147/53  Pulse:  88  Resp:    Temp:  97.6 F (36.4 C)  SpO2: 95% 96%   Vitals:   07/28/20 2206 07/29/20 0416 07/29/20 0708 07/29/20 0937  BP:  (!) 166/59  (!) 147/53  Pulse:  80  88  Resp:  15    Temp:  98.6 F (37 C)  97.6 F (36.4 C)  TempSrc:  Oral  Oral  SpO2: 93% 94% 95% 96%  Weight:      Height:        General: Pt is alert, awake, not in acute distress Cardiovascular: RRR, S1/S2 +, no rubs, no gallops Respiratory: Mild expiratory wheezes Abdominal: Soft, NT, ND, bowel sounds + Extremities: no edema, no cyanosis    The results of significant diagnostics from this hospitalization (including imaging, microbiology, ancillary and laboratory) are listed below for reference.     Microbiology: Recent Results (from the past 240 hour(s))  Resp Panel by RT-PCR (Flu A&B, Covid) Nasopharyngeal Swab     Status: None   Collection Time: 07/27/20 11:18 PM   Specimen:  Nasopharyngeal Swab; Nasopharyngeal(NP) swabs in vial transport medium  Result Value Ref Range Status   SARS Coronavirus 2 by RT PCR NEGATIVE NEGATIVE Final    Comment: (NOTE) SARS-CoV-2 target nucleic acids are NOT DETECTED.  The SARS-CoV-2 RNA is generally detectable in upper respiratory specimens during the acute phase of infection. The lowest concentration of SARS-CoV-2 viral copies this assay can detect is 138 copies/mL.  A negative result does not preclude SARS-Cov-2 infection and should not be used as the sole basis for treatment or other patient management decisions. A negative result may occur with  improper specimen collection/handling, submission of specimen other than nasopharyngeal swab, presence of viral mutation(s) within the areas targeted by this assay, and inadequate number of viral copies(<138 copies/mL). A negative result must be combined with clinical observations, patient history, and epidemiological information. The expected result is Negative.  Fact Sheet for Patients:  BloggerCourse.com  Fact Sheet for Healthcare Providers:  SeriousBroker.it  This test is no t yet approved or cleared by the [place] FDA and  has been authorized for detection and/or diagnosis of SARS-CoV-2 by FDA under an Emergency Use Authorization (EUA). This EUA will remain  in effect (meaning this test can be used) for the duration of the COVID-19 declaration under Section 564(b)(1) of the Act, 21 U.S.C.section 360bbb-3(b)(1), unless the authorization is terminated  or revoked sooner.       Influenza A by PCR NEGATIVE NEGATIVE Final   Influenza B by PCR NEGATIVE NEGATIVE Final    Comment: (NOTE) The Xpert Xpress SARS-CoV-2/FLU/RSV plus assay is intended as an aid in the diagnosis of influenza from Nasopharyngeal swab specimens and should not be used as a sole basis for treatment. Nasal washings and aspirates are unacceptable for  Xpert Xpress SARS-CoV-2/FLU/RSV testing.  Fact Sheet for Patients: BloggerCourse.com  Fact Sheet for Healthcare Providers: SeriousBroker.it  This test is not yet approved or cleared by the [place] FDA and has been authorized for detection and/or diagnosis of SARS-CoV-2 by FDA under an Emergency Use Authorization (EUA). This EUA will remain in effect (meaning this test can be used) for the duration of the COVID-19 declaration under Section 564(b)(1) of the Act, 21 U.S.C. section 360bbb-3(b)(1), unless the authorization is terminated or revoked.  Performed at [facility], [address] [place], [place] [address]      Labs: BNP (last 3 results) Recent Labs    07/27/20 2311  BNP 65.9   Basic Metabolic Panel: Recent Labs  Lab 07/27/20 2245 07/28/20 0933 07/29/20 0111  NA 130* 131* 130*  K 4.1 3.9 4.5  CL 100 99 99  CO2 22 18* 22  GLUCOSE 179* 262* 151*  BUN CREATININE 1.27* 1.30* 1.00  CALCIUM 9.3 9.4 9.9   Liver Function Tests: No results for input(s): AST, ALT, ALKPHOS, BILITOT, PROT, ALBUMIN in the last 168 hours. No results for input(s): LIPASE, AMYLASE in the last 168 hours. No results for input(s): AMMONIA in the last 168 hours. CBC: Recent Labs  Lab 07/27/20 2245 07/29/20 0111  WBC 8.6 12.7*  NEUTROABS 3.6 11.2*  HGB 11.2* 9.1*  HCT 35.0* 27.5*  MCV 95.4 93.5  PLT 270 212   Cardiac Enzymes: No results for input(s): CKTOTAL, CKMB, CKMBINDEX, TROPONINI in the last 168 hours. BNP: Invalid input(s): POCBNP CBG: Recent Labs  Lab 07/28/20 1214 07/28/20 1647 07/28/20 2043 07/29/20 0639 07/29/20 1144  GLUCAP 183* 171* 167* 159* 129*   D-Dimer No results for input(s): DDIMER in the last 72 hours. Hgb A1c No results for input(s): HGBA1C in the last 72 hours. Lipid Profile No results for input(s): CHOL, HDL, LDLCALC, TRIG, CHOLHDL, LDLDIRECT in the last 72 hours. Thyroid  function studies No results for input(s): TSH, T4TOTAL, T3FREE, THYROIDAB in the last 72 hours.  Invalid input(s): FREET3 Anemia work up No results for input(s): VITAMINB12, FOLATE, FERRITIN, TIBC, IRON, RETICCTPCT in the  last 72 hours. Urinalysis    Component Value Date/Time   COLORURINE YELLOW 09/18/2019 1030   APPEARANCEUR CLEAR 09/18/2019 1030   LABSPEC 1.018 09/18/2019 1030   PHURINE 6.0 09/18/2019 1030   GLUCOSEU NEGATIVE 09/18/2019 1030   HGBUR MODERATE (A) 09/18/2019 1030   BILIRUBINUR NEGATIVE 09/18/2019 1030   KETONESUR NEGATIVE 09/18/2019 1030   PROTEINUR >=300 (A) 09/18/2019 1030   NITRITE NEGATIVE 09/18/2019 1030   LEUKOCYTESUR NEGATIVE 09/18/2019 1030   Sepsis Labs Invalid input(s): PROCALCITONIN,  WBC,  LACTICIDVEN Microbiology Recent Results (from the past 240 hour(s))  Resp Panel by RT-PCR (Flu A&B, Covid) Nasopharyngeal Swab     Status: None   Collection Time: 07/27/20 11:18 PM   Specimen: Nasopharyngeal Swab; Nasopharyngeal(NP) swabs in vial transport medium  Result Value Ref Range Status   SARS Coronavirus 2 by RT PCR NEGATIVE NEGATIVE Final    Comment: (NOTE) SARS-CoV-2 target nucleic acids are NOT DETECTED.  The SARS-CoV-2 RNA is generally detectable in upper respiratory specimens during the acute phase of infection. The lowest concentration of SARS-CoV-2 viral copies this assay can detect is 138 copies/mL. A negative result does not preclude SARS-Cov-2 infection and should not be used as the sole basis for treatment or other patient management decisions. A negative result may occur with  improper specimen collection/handling, submission of specimen other than nasopharyngeal swab, presence of viral mutation(s) within the areas targeted by this assay, and inadequate number of viral copies(<138 copies/mL). A negative result must be combined with clinical observations, patient history, and epidemiological information. The expected result is  Negative.  Fact Sheet for Patients:  BloggerCourse.com  Fact Sheet for Healthcare Providers:  SeriousBroker.it  This test is no t yet approved or cleared by the [place] FDA and  has been authorized for detection and/or diagnosis of SARS-CoV-2 by FDA under an Emergency Use Authorization (EUA). This EUA will remain  in effect (meaning this test can be used) for the duration of the COVID-19 declaration under Section 564(b)(1) of the Act, 21 U.S.C.section 360bbb-3(b)(1), unless the authorization is terminated  or revoked sooner.       Influenza A by PCR NEGATIVE NEGATIVE Final   Influenza B by PCR NEGATIVE NEGATIVE Final    Comment: (NOTE) The Xpert Xpress SARS-CoV-2/FLU/RSV plus assay is intended as an aid in the diagnosis of influenza from Nasopharyngeal swab specimens and should not be used as a sole basis for treatment. Nasal washings and aspirates are unacceptable for Xpert Xpress SARS-CoV-2/FLU/RSV testing.  Fact Sheet for Patients: BloggerCourse.com  Fact Sheet for Healthcare Providers: SeriousBroker.it  This test is not yet approved or cleared by the [place] FDA and has been authorized for detection and/or diagnosis of SARS-CoV-2 by FDA under an Emergency Use Authorization (EUA). This EUA will remain in effect (meaning this test can be used) for the duration of the COVID-19 declaration under Section 564(b)(1) of the Act, 21 U.S.C. section 360bbb-3(b)(1), unless the authorization is terminated or revoked.  Performed at [facility], [address], [place], [place] [address]     Please note: You were cared for by a hospitalist during your hospital stay. Once you are discharged, your primary care physician will handle any further medical issues. Please note that NO REFILLS for any discharge medications will be authorized once you are discharged, as it is  imperative that you return to your primary care physician (or establish a relationship with a primary care physician if you do not have one) for your post hospital  discharge needs so that they can reassess your need for medications and monitor your lab values.    Time coordinating discharge: 40 minutes  SIGNED:   Burnadette Pop, MD  [facility] 07/29/2020, 1:46 PM Pager [phone]  If 7PM-7AM, please contact night-coverage [email or web address] Password [email or web address]

## 2020-08-02 ENCOUNTER — Other Ambulatory Visit: Payer: Self-pay | Admitting: Cardiology

## 2020-08-02 DIAGNOSIS — Z7982 Long term (current) use of aspirin: Secondary | ICD-10-CM | POA: Diagnosis not present

## 2020-08-02 DIAGNOSIS — D649 Anemia, unspecified: Secondary | ICD-10-CM | POA: Diagnosis not present

## 2020-08-02 DIAGNOSIS — I10 Essential (primary) hypertension: Secondary | ICD-10-CM | POA: Diagnosis not present

## 2020-08-02 DIAGNOSIS — I442 Atrioventricular block, complete: Secondary | ICD-10-CM | POA: Diagnosis not present

## 2020-08-02 DIAGNOSIS — I700 Atherosclerosis of aorta: Secondary | ICD-10-CM

## 2020-08-02 DIAGNOSIS — J4551 Severe persistent asthma with (acute) exacerbation: Secondary | ICD-10-CM | POA: Diagnosis not present

## 2020-08-02 DIAGNOSIS — E785 Hyperlipidemia, unspecified: Secondary | ICD-10-CM | POA: Diagnosis not present

## 2020-08-02 DIAGNOSIS — E871 Hypo-osmolality and hyponatremia: Secondary | ICD-10-CM | POA: Diagnosis not present

## 2020-08-02 DIAGNOSIS — Z87891 Personal history of nicotine dependence: Secondary | ICD-10-CM | POA: Diagnosis not present

## 2020-08-02 DIAGNOSIS — Z950 Presence of cardiac pacemaker: Secondary | ICD-10-CM | POA: Diagnosis not present

## 2020-08-02 DIAGNOSIS — J45901 Unspecified asthma with (acute) exacerbation: Secondary | ICD-10-CM | POA: Diagnosis not present

## 2020-08-06 DIAGNOSIS — Z950 Presence of cardiac pacemaker: Secondary | ICD-10-CM | POA: Diagnosis not present

## 2020-08-06 DIAGNOSIS — Z87891 Personal history of nicotine dependence: Secondary | ICD-10-CM | POA: Diagnosis not present

## 2020-08-06 DIAGNOSIS — E785 Hyperlipidemia, unspecified: Secondary | ICD-10-CM | POA: Diagnosis not present

## 2020-08-06 DIAGNOSIS — D649 Anemia, unspecified: Secondary | ICD-10-CM | POA: Diagnosis not present

## 2020-08-06 DIAGNOSIS — E871 Hypo-osmolality and hyponatremia: Secondary | ICD-10-CM | POA: Diagnosis not present

## 2020-08-06 DIAGNOSIS — I10 Essential (primary) hypertension: Secondary | ICD-10-CM | POA: Diagnosis not present

## 2020-08-06 DIAGNOSIS — J4551 Severe persistent asthma with (acute) exacerbation: Secondary | ICD-10-CM | POA: Diagnosis not present

## 2020-08-06 DIAGNOSIS — Z7982 Long term (current) use of aspirin: Secondary | ICD-10-CM | POA: Diagnosis not present

## 2020-08-06 DIAGNOSIS — I442 Atrioventricular block, complete: Secondary | ICD-10-CM | POA: Diagnosis not present

## 2020-08-06 DIAGNOSIS — I739 Peripheral vascular disease, unspecified: Secondary | ICD-10-CM | POA: Diagnosis not present

## 2020-08-06 DIAGNOSIS — J4520 Mild intermittent asthma, uncomplicated: Secondary | ICD-10-CM | POA: Diagnosis not present

## 2020-08-06 DIAGNOSIS — E782 Mixed hyperlipidemia: Secondary | ICD-10-CM | POA: Diagnosis not present

## 2020-08-08 DIAGNOSIS — E785 Hyperlipidemia, unspecified: Secondary | ICD-10-CM | POA: Diagnosis not present

## 2020-08-08 DIAGNOSIS — I10 Essential (primary) hypertension: Secondary | ICD-10-CM | POA: Diagnosis not present

## 2020-08-08 DIAGNOSIS — M130 Polyarthritis, unspecified: Secondary | ICD-10-CM | POA: Diagnosis not present

## 2020-08-08 DIAGNOSIS — R69 Illness, unspecified: Secondary | ICD-10-CM | POA: Diagnosis not present

## 2020-08-09 DIAGNOSIS — I442 Atrioventricular block, complete: Secondary | ICD-10-CM | POA: Diagnosis not present

## 2020-08-09 DIAGNOSIS — Z950 Presence of cardiac pacemaker: Secondary | ICD-10-CM | POA: Diagnosis not present

## 2020-08-09 DIAGNOSIS — Z7982 Long term (current) use of aspirin: Secondary | ICD-10-CM | POA: Diagnosis not present

## 2020-08-09 DIAGNOSIS — I10 Essential (primary) hypertension: Secondary | ICD-10-CM | POA: Diagnosis not present

## 2020-08-09 DIAGNOSIS — E871 Hypo-osmolality and hyponatremia: Secondary | ICD-10-CM | POA: Diagnosis not present

## 2020-08-09 DIAGNOSIS — Z87891 Personal history of nicotine dependence: Secondary | ICD-10-CM | POA: Diagnosis not present

## 2020-08-09 DIAGNOSIS — D649 Anemia, unspecified: Secondary | ICD-10-CM | POA: Diagnosis not present

## 2020-08-09 DIAGNOSIS — E785 Hyperlipidemia, unspecified: Secondary | ICD-10-CM | POA: Diagnosis not present

## 2020-08-09 DIAGNOSIS — J4551 Severe persistent asthma with (acute) exacerbation: Secondary | ICD-10-CM | POA: Diagnosis not present

## 2020-08-14 DIAGNOSIS — D649 Anemia, unspecified: Secondary | ICD-10-CM | POA: Diagnosis not present

## 2020-08-14 DIAGNOSIS — E785 Hyperlipidemia, unspecified: Secondary | ICD-10-CM | POA: Diagnosis not present

## 2020-08-14 DIAGNOSIS — Z7982 Long term (current) use of aspirin: Secondary | ICD-10-CM | POA: Diagnosis not present

## 2020-08-14 DIAGNOSIS — Z87891 Personal history of nicotine dependence: Secondary | ICD-10-CM | POA: Diagnosis not present

## 2020-08-14 DIAGNOSIS — I10 Essential (primary) hypertension: Secondary | ICD-10-CM | POA: Diagnosis not present

## 2020-08-14 DIAGNOSIS — Z950 Presence of cardiac pacemaker: Secondary | ICD-10-CM | POA: Diagnosis not present

## 2020-08-14 DIAGNOSIS — J4551 Severe persistent asthma with (acute) exacerbation: Secondary | ICD-10-CM | POA: Diagnosis not present

## 2020-08-14 DIAGNOSIS — I442 Atrioventricular block, complete: Secondary | ICD-10-CM | POA: Diagnosis not present

## 2020-08-14 DIAGNOSIS — E871 Hypo-osmolality and hyponatremia: Secondary | ICD-10-CM | POA: Diagnosis not present

## 2020-08-19 DIAGNOSIS — D649 Anemia, unspecified: Secondary | ICD-10-CM | POA: Diagnosis not present

## 2020-08-19 DIAGNOSIS — E871 Hypo-osmolality and hyponatremia: Secondary | ICD-10-CM | POA: Diagnosis not present

## 2020-08-19 DIAGNOSIS — I10 Essential (primary) hypertension: Secondary | ICD-10-CM | POA: Diagnosis not present

## 2020-08-19 DIAGNOSIS — Z950 Presence of cardiac pacemaker: Secondary | ICD-10-CM | POA: Diagnosis not present

## 2020-08-19 DIAGNOSIS — J4551 Severe persistent asthma with (acute) exacerbation: Secondary | ICD-10-CM | POA: Diagnosis not present

## 2020-08-19 DIAGNOSIS — E785 Hyperlipidemia, unspecified: Secondary | ICD-10-CM | POA: Diagnosis not present

## 2020-08-19 DIAGNOSIS — I442 Atrioventricular block, complete: Secondary | ICD-10-CM | POA: Diagnosis not present

## 2020-08-19 DIAGNOSIS — Z87891 Personal history of nicotine dependence: Secondary | ICD-10-CM | POA: Diagnosis not present

## 2020-08-19 DIAGNOSIS — Z7982 Long term (current) use of aspirin: Secondary | ICD-10-CM | POA: Diagnosis not present

## 2020-08-21 ENCOUNTER — Encounter: Payer: Self-pay | Admitting: Cardiology

## 2020-08-21 ENCOUNTER — Ambulatory Visit: Payer: Medicare HMO | Admitting: Cardiology

## 2020-08-21 ENCOUNTER — Other Ambulatory Visit: Payer: Self-pay

## 2020-08-21 VITALS — BP 140/64 | HR 83 | Resp 16 | Ht <= 58 in | Wt 139.0 lb

## 2020-08-21 DIAGNOSIS — I480 Paroxysmal atrial fibrillation: Secondary | ICD-10-CM | POA: Diagnosis not present

## 2020-08-21 DIAGNOSIS — I700 Atherosclerosis of aorta: Secondary | ICD-10-CM | POA: Diagnosis not present

## 2020-08-21 DIAGNOSIS — Z950 Presence of cardiac pacemaker: Secondary | ICD-10-CM

## 2020-08-21 DIAGNOSIS — I10 Essential (primary) hypertension: Secondary | ICD-10-CM | POA: Diagnosis not present

## 2020-08-21 DIAGNOSIS — R0989 Other specified symptoms and signs involving the circulatory and respiratory systems: Secondary | ICD-10-CM | POA: Diagnosis not present

## 2020-08-21 DIAGNOSIS — E7800 Pure hypercholesterolemia, unspecified: Secondary | ICD-10-CM

## 2020-08-21 DIAGNOSIS — Z87891 Personal history of nicotine dependence: Secondary | ICD-10-CM

## 2020-08-21 DIAGNOSIS — I442 Atrioventricular block, complete: Secondary | ICD-10-CM | POA: Diagnosis not present

## 2020-08-21 DIAGNOSIS — R0602 Shortness of breath: Secondary | ICD-10-CM | POA: Diagnosis not present

## 2020-08-21 MED ORDER — APIXABAN 5 MG PO TABS
5.0000 mg | ORAL_TABLET | Freq: Two times a day (BID) | ORAL | 0 refills | Status: DC
Start: 2020-08-21 — End: 2020-11-21

## 2020-08-21 NOTE — Progress Notes (Signed)
ID:  Sarah Burns, DOB 10/09/1924, MRN [id number]  PCP:  Lucianne Lei, MD  Cardiologist:  Rex Kras, DO, [facility] (established care 03/11/2020) Former Cardiology Providers: Dr. Anders Simmonds at [facility]  Date: 08/21/20 Last Office Visit: 05/21/2020  Chief Complaint  Patient presents with  . Shortness of Breath  . Hospitalization Follow-up    HPI  Sarah Burns is a 87 y.o. female who presents to the office with a chief complaint of " shortness of breath and recent ER visit." Patient's past medical history and cardiovascular risk factors include: Paroxysmal atrial fibrillation, presence of a permanent pacemaker, hypertension, hypercholesterolemia, former smoker, postmenopausal female, advanced age.  She is referred to the office at the request of Lucianne Lei, MD for evaluation of heart disease.  Patient is accompanied by her daughter Sarah Burns at today's office visit.   She provides verbal consent in regards to discussing her medical history and plan of care in the presence of her daughter.  Patient states that she recently moved from [place] to [place] back in April 2021 to be closer to her daughter.  Based on the pacemaker card the Medtronic device was implanted on January 29, 2014 and it is a Advisa DR MRI SureScan Pacemaker.  Since then patient has undergone a pacemaker interrogation results noted below and reviewed with both the patient and her daughter at today's visit.  Since last office visit she has been seen at the ER in April 2022 for progressive shortness of breath.  Patient symptoms improved after breathing treatment and the underlying diagnosis was asthma exacerbation.  However, during that hospitalization they did check a troponin which noted a small delta change in the peak high sensitive troponin was 104.  Patient did not have any chest pain or anginal discomfort.  Since last office visit she is now successfully transitioned her pacemaker care to our  practice.  Based on the most recent pacemaker interrogation she has had multiple mode switch and episodes of atrial fibrillation.  Patient denies any formal diagnosis of atrial fibrillation.  No prior history of intracranial or gastrointestinal bleeding.  Patient states that she uses a cane to walk and has not had any recent falls.  No recent surgeries.  We discussed the pathophysiology and the management of atrial fibrillation with regards to rate control strategy,  rhythm control, and thromboembolic prophylaxis.  Home blood pressures are within excellent control SBP 341-937 mmHg and diastolic blood pressure 90-24 mmHg.  ALLERGIES: Allergies  Allergen Reactions  . Lactose Intolerance (Gi) Nausea And Vomiting and Other (See Comments)    Stomach pain  . Penicillins Other (See Comments)    Unknown reaction     MEDICATION LIST PRIOR TO VISIT: Current Meds  Medication Sig  . acetaminophen (TYLENOL) 500 MG tablet Take 500 mg by mouth daily as needed for headache (pain).  Marland Kitchen albuterol (VENTOLIN HFA) 108 (90 Base) MCG/ACT inhaler Inhale 2 puffs into the lungs every 6 (six) hours as needed for wheezing or shortness of breath.  Marland Kitchen amLODipine (NORVASC) 10 MG tablet Take 10 mg by mouth daily.  Marland Kitchen apixaban (ELIQUIS) 5 MG TABS tablet Take 1 tablet (5 mg total) by mouth 2 (two) times daily.  . ASPIRIN LOW DOSE 81 MG EC tablet TAKE 1 TABLET(81 MG) BY MOUTH DAILY. SWALLOW WHOLE  . atorvastatin (LIPITOR) 80 MG tablet Take 80 mg by mouth daily.  . Calcium Carb-Cholecalciferol (CALCIUM 600-D PO) Take 1 tablet by mouth 2 (two) times daily.  . cetirizine (ZYRTEC) 10  MG tablet Take 10 mg by mouth daily as needed for allergies.  Marland Kitchen diclofenac Sodium (VOLTAREN) 1 % GEL Apply 1 application topically 2 (two) times daily as needed (knee pain).  . fluticasone-salmeterol (ADVAIR HFA) 115-21 MCG/ACT inhaler Inhale 2 puffs into the lungs 2 (two) times daily as needed (shortness of breath/wheezing).  Marland Kitchen guaiFENesin (MUCINEX)  600 MG 12 hr tablet Take 1 tablet (600 mg total) by mouth 2 (two) times daily as needed.  Marland Kitchen ipratropium-albuterol (DUONEB) 0.5-2.5 (3) MG/3ML SOLN Take 3 mLs by nebulization every 6 (six) hours as needed.  . Lactase (LACTAID PO) Take 1 tablet by mouth as needed (when consuming dairy).  Marland Kitchen losartan (COZAAR) 50 MG tablet Take 50 mg by mouth daily.  . metoprolol succinate (TOPROL XL) 25 MG 24 hr tablet Take 1 tablet (25 mg total) by mouth daily.  . montelukast (SINGULAIR) 10 MG tablet Take 10 mg by mouth at bedtime.  Marland Kitchen OVER THE COUNTER MEDICATION Take 1 tablet by mouth daily. Super beets  . psyllium (METAMUCIL) 58.6 % powder Take 1 packet by mouth daily as needed (mild constipation).     PAST MEDICAL HISTORY: Past Medical History:  Diagnosis Date  . Acute cholecystitis 09/17/2019  . Allergies   . Asthma   . Encounter for care of pacemaker 06/17/2020  . Heart block AV complete ([place]) 06/17/2020  . Hyperlipidemia   . Hypertension   . Pacemaker Dual chamber Medtronic Advisa DR MRI SureScan 01/29/2014 06/17/2020  . Paroxysmal A-fib ([place])     PAST SURGICAL HISTORY: Past Surgical History:  Procedure Laterality Date  . CHOLECYSTECTOMY N/A 09/19/2019   Procedure: LAPAROSCOPIC CHOLECYSTECTOMY;  Surgeon: Ralene Ok, MD;  Location: [facility];  Service: General;  Laterality: N/A;  . GROIN DISSECTION    . INSERT / REPLACE / REMOVE PACEMAKER    . nasal polyp removal      FAMILY HISTORY: The patient family history includes Allergies in an other family member; Asthma in her brother and another family member. No family history of premature coronary disease or sudden cardiac death.  SOCIAL HISTORY:  The patient  reports that she has quit smoking. Her smoking use included cigarettes. She has a 5.00 pack-year smoking history. She has never used smokeless tobacco. She reports previous alcohol use.  REVIEW OF SYSTEMS: Review of Systems  Constitutional: Negative for chills and fever.  HENT: Negative for  hoarse voice and nosebleeds.   Eyes: Negative for discharge, double vision and pain.  Cardiovascular: Negative for chest pain, claudication, dyspnea on exertion, leg swelling, near-syncope, orthopnea, palpitations, paroxysmal nocturnal dyspnea and syncope.  Respiratory: Positive for shortness of breath (Chronic and stable). Negative for hemoptysis and wheezing.   Musculoskeletal: Negative for muscle cramps and myalgias.  Gastrointestinal: Negative for abdominal pain, constipation, diarrhea, hematemesis, hematochezia, melena, nausea and vomiting.  Neurological: Negative for dizziness and light-headedness.   PHYSICAL EXAM: Vitals with BMI 08/21/2020 07/29/2020 07/29/2020  Height '4\' 3"'  - -  Weight 139 lbs - -  BMI 16.9 - -  Systolic 450 388 828  Diastolic 64 53 59  Pulse 83 88 80   CONSTITUTIONAL: Age-appropriate female, hemodynamically stable, well-developed and well-nourished. No acute distress.  SKIN: Skin is warm and dry. No rash noted. No cyanosis. No pallor. No jaundice HEAD: Normocephalic and atraumatic.  EYES: No scleral icterus MOUTH/THROAT: Moist oral membranes.  NECK: No JVD present. No thyromegaly noted. Right carotid bruits  LYMPHATIC: No visible cervical adenopathy.  CHEST Normal respiratory effort. No intercostal retractions.  Pacemaker  located in the left infraclavicular region.  Site is clean dry and intact. LUNGS: Clear to auscultation bilaterally.  No stridor. No wheezes. No rales.  CARDIOVASCULAR: Positive S1-S2, regular, soft holosystolic murmur heard at the apex, no gallops or rubs. ABDOMINAL: Obese, soft, nontender, nondistended, positive bowel sounds all 4 quadrants no apparent ascites.  EXTREMITIES: No peripheral edema  HEMATOLOGIC: No significant bruising NEUROLOGIC: Oriented to person, place, and time. Nonfocal. Normal muscle tone.  PSYCHIATRIC: Normal mood and affect. Normal behavior. Cooperative  RADIOLOGY: Chest x-ray 03/22/2020: Left pacer in place as  described above. No acute cardiopulmonary disease. Bibasilar scarring.  Aortic atherosclerosis.  CARDIAC DATABASE: Scheduled Remote pacemaker check  06/17/2020: 101 mode switch episodes.  EGMs reveal paroxysmal atrial fibrillation, latest episode 09/24/2019.  Longest episode 09/21/2019 for 2 hours and 40 minutes.  There are 4 ventricular high rate episodes, EGM consistent with brief less than 2-second NSVT. AP 18%, VP 100%. Longevity 2 years.  Normal pacemaker function.  Scheduled  In office pacemaker check 06/17/2020 (Pacemaker Dual chamber Medtronic Advisa DR MRI SureScan 01/29/2014) Single (S)/Dual (D)/BV: D. Presenting ASVP @ 82/min. Pacemaker dependant:  YES. Underlying AS VP @ 40/min. AP 18%, VP 100%. AMS Episodes 4.  AT/AF burden < 0.1% since Dec 2019 . Longest 2H 40 M of A. Fib. Latest 09/24/2019 for 7 minutes. HVR 4. Longest 2Sec of NSVT. Longevity 2 Years. Magnet rate: >85%. Lead measurements: Stable. Histogram: Low (L)/normal (N)/high (H)  Normal. Patient activity Low.   EKG: 08/21/2020: Normal sinus rhythm, 74 bpm, poor R wave progression, atrial sensed ventricular paced rhythm.  Echocardiogram: 07/28/2020: LVEF 65 to 70%, mild LVH, diastolic parameters indeterminate, moderately dilated left atrium, moderately dilated right atrium, aortic valve sclerosis with mild stenosis.   Stress Testing: No results found for this or any previous visit from the past 1095 days.  Heart Catheterization: None  LABORATORY DATA: CBC Latest Ref Rng & Units 07/29/2020 07/27/2020 09/23/2019  WBC 4.0 - 10.5 K/uL 12.7(H) 8.6 19.7(H)  Hemoglobin 12.0 - 15.0 g/dL 9.1(L) 11.2(L) 9.3(L)  Hematocrit 36.0 - 46.0 % 27.5(L) 35.0(L) 28.0(L)  Platelets 150 - 400 K/uL 212 270 349    CMP Latest Ref Rng & Units 07/29/2020 07/28/2020 07/27/2020  Glucose 70 - 99 mg/dL 151(H) 262(H) 179(H)  BUN 8 - 23 mg/dL '17 16 15  ' Creatinine 0.44 - 1.00 mg/dL 1.00 1.30(H) 1.27(H)  Sodium 135 - 145 mmol/L 130(L) 131(L)  130(L)  Potassium 3.5 - 5.1 mmol/L 4.5 3.9 4.1  Chloride 98 - 111 mmol/L 99 99 100  CO2 22 - 32 mmol/L 22 18(L) 22  Calcium 8.9 - 10.3 mg/dL 9.9 9.4 9.3  Total Protein 6.5 - 8.1 g/dL - - -  Total Bilirubin 0.3 - 1.2 mg/dL - - -  Alkaline Phos 38 - 126 U/L - - -  AST 15 - 41 U/L - - -  ALT 0 - 44 U/L - - -    Lipid Panel  No results found for: CHOL, TRIG, HDL, CHOLHDL, VLDL, LDLCALC, LDLDIRECT, LABVLDL  No components found for: NTPROBNP No results for input(s): PROBNP in the last 8760 hours. No results for input(s): TSH in the last 8760 hours.  BMP Recent Labs    09/21/19 0630 09/22/19 0744 09/23/19 1057 07/27/20 2245 07/28/20 0933 07/29/20 0111  NA 134* 129* 131* 130* 131* 130*  K 3.4* 4.2 3.6 4.1 3.9 4.5  CL 101 101 101 100 99 99  CO2 19* 20* 19* 22 18* 22  GLUCOSE 146* 143* 138*  179* 262* 151*  BUN 8 8 7* '15 16 17  ' CREATININE 0.79 0.75 0.65 1.27* 1.30* 1.00  CALCIUM 8.5* 8.6* 8.2* 9.3 9.4 9.9  GFRNONAA >60 >60 >60 39* 38* 53*  GFRAA >60 >60 >60  --   --   --     HEMOGLOBIN A1C Lab Results  Component Value Date   HGBA1C 5.6 07/29/2020   MPG 114 07/29/2020     External Labs: Collected: 09/15/2019 Creatinine 0.74 mg/dL. eGFR: 81 mL/min per 1.73 m Hemoglobin 10.5 g/dL. Hematocrit 32.3% Lipid profile: Total cholesterol 211, triglycerides 42, HDL 100, LDL 99  External Labs: Collected: 02/27/2020 Creatinine 1 mg/dL. eGFR: 56 mL/min per 1.73 m Hemoglobin 10.8 g/dL Lipid profile: Total cholesterol 187, triglycerides 46, HDL 98, LDL 76, non-HDL 89.  External Labs: Collected: 05/13/2020 labs received from her PCPs office after the appointment. Creatinine 0.95 mg/dL. eGFR: 60 mL/min per 1.73 m AST 20, ALT 16, alkaline phosphatase 76 Hemoglobin 10.6 g/dL, hematocrit 32.4% Sodium 132, potassium 4.4, chloride 100, bicarb 26 Lipid profile: Total cholesterol 184, triglycerides 67, HDL 87, LDL 83, non-HDL 97  IMPRESSION:    ICD-10-CM   1. Shortness of breath   R06.02 EKG 12-Lead  2. Pacemaker Dual chamber Medtronic Advisa DR MRI SureScan 01/29/2014  Z95.0   3. Heart block AV complete (HCC)  I44.2   4. Paroxysmal atrial fibrillation (HCC)  I48.0 Hemoglobin and hematocrit, blood    Basic metabolic panel    Magnesium    apixaban (ELIQUIS) 5 MG TABS tablet    metoprolol succinate (TOPROL XL) 25 MG 24 hr tablet  5. Right carotid bruit  R09.89 PCV CAROTID DUPLEX (BILATERAL)  6. Essential hypertension, benign  I10   7. Hypercholesteremia  E78.00   8. Former smoker  Z87.891   28. Atherosclerosis of aorta (HCC)  I70.0      RECOMMENDATIONS: Sarah Burns is a 87 y.o. female whose past medical history and cardiac risk factors include: Paroxysmal atrial fibrillation, presence of a permanent pacemaker, hypertension, hypercholesterolemia, former smoker, postmenopausal female, advanced age.  Presence of permanent pacemaker:  Pacemaker implanted due to second-degree and high degree AV block.    Most recent pacemaker interrogation report reviewed. Patient is V paced 100% of the time and her battery life is for approximately 2 years.  She has had a history of not following with her regular pacemaker checks and therefore is educated of these findings.  Patient verbalizes understanding and states that she will follow-up with her remote transmissions and in office checks  Paroxysmal atrial fibrillation:  Noted on pacemaker interrogation.  Rate control: Metoprolol 25 mg p.o. daily  Rhythm control: N/A  Thromboembolic prophylaxis: Eliquis  Despite her advanced age patient is very active, independent, and does rate activities of daily living.  We had a long discussion with regards to risks, benefits, and alternatives to oral anticoagulation.  Patient wishes to initiate oral anticoagulation for thromboembolic prophylaxis.  Long-term oral anticoagulation:  Indication: Paroxysmal atrial fibrillation  CHA2DS2-VASc SCORE is 5 which correlates to 6.7% risk of  stroke per year (age greater than 75, hypertension, aortic atherosclerosis, gender).  Patient and daughter has been educated on the importance of monitoring for evidence of bleeding which includes but not limited to hemoptysis, hematochezia, melanotic stools, and hematuria. Patient and daughter educated on fall precautions and if she was to be injured despite the mechanism of injury she is to seek medical attention at the closest ER since she is on blood thinners.  Patient and her daughter  understands importance of this because if internal bleeding is not treated in a timely manner it may further lead to morbidity and/or mortality.  Patient and daughter voices understanding of these recommendations and provides verbal feedback.  We will check a BMP, hemoglobin and hematocrit prior to the next office visit.  Right carotid bruit: Currently on aspirin and statin therapy. We will check carotid duplex.  Shortness of breath: Chronic and stable  Reviewed the results of the last echocardiogram.   Based on the records received no recent stress test have been performed.    This is a 87 year old African-American female who is very functional for her age and therefore we discussed an ischemic evaluation due to comorbid conditions, NSVT noted on pacemaker interrogation, and newly discovered atrial fibrillation.  However, both the patient and her daughter would like to hold off on stress testing at this time and re discussed this at the next office visit.  Aortic atherosclerosis: Continue aspirin and statin therapy.  Benign essential hypertension:  Office blood pressure within acceptable range.   Medications reconciled.  Currently managed by primary care provider.  Hypercholesterolemia: Continue statin therapy. Currently managed by primary care provider.  Former smoker: Educated on the importance of continued smoking cessation.   FINAL MEDICATION LIST END OF ENCOUNTER: Meds ordered this encounter   Medications  . apixaban (ELIQUIS) 5 MG TABS tablet    Sig: Take 1 tablet (5 mg total) by mouth 2 (two) times daily.    Dispense:  180 tablet    Refill:  0  . metoprolol succinate (TOPROL XL) 25 MG 24 hr tablet    Sig: Take 1 tablet (25 mg total) by mouth daily.    Dispense:  90 tablet    Refill:  0    There are no discontinued medications.   Current Outpatient Medications:  .  acetaminophen (TYLENOL) 500 MG tablet, Take 500 mg by mouth daily as needed for headache (pain)., Disp: , Rfl:  .  albuterol (VENTOLIN HFA) 108 (90 Base) MCG/ACT inhaler, Inhale 2 puffs into the lungs every 6 (six) hours as needed for wheezing or shortness of breath., Disp: 8 g, Rfl: 1 .  amLODipine (NORVASC) 10 MG tablet, Take 10 mg by mouth daily., Disp: , Rfl:  .  apixaban (ELIQUIS) 5 MG TABS tablet, Take 1 tablet (5 mg total) by mouth 2 (two) times daily., Disp: 180 tablet, Rfl: 0 .  ASPIRIN LOW DOSE 81 MG EC tablet, TAKE 1 TABLET(81 MG) BY MOUTH DAILY. SWALLOW WHOLE, Disp: 30 tablet, Rfl: 11 .  atorvastatin (LIPITOR) 80 MG tablet, Take 80 mg by mouth daily., Disp: , Rfl:  .  Calcium Carb-Cholecalciferol (CALCIUM 600-D PO), Take 1 tablet by mouth 2 (two) times daily., Disp: , Rfl:  .  cetirizine (ZYRTEC) 10 MG tablet, Take 10 mg by mouth daily as needed for allergies., Disp: , Rfl:  .  diclofenac Sodium (VOLTAREN) 1 % GEL, Apply 1 application topically 2 (two) times daily as needed (knee pain)., Disp: , Rfl:  .  fluticasone-salmeterol (ADVAIR HFA) 115-21 MCG/ACT inhaler, Inhale 2 puffs into the lungs 2 (two) times daily as needed (shortness of breath/wheezing)., Disp: , Rfl:  .  guaiFENesin (MUCINEX) 600 MG 12 hr tablet, Take 1 tablet (600 mg total) by mouth 2 (two) times daily as needed., Disp: 14 tablet, Rfl: 0 .  ipratropium-albuterol (DUONEB) 0.5-2.5 (3) MG/3ML SOLN, Take 3 mLs by nebulization every 6 (six) hours as needed., Disp: 360 mL, Rfl: 1 .  Lactase (LACTAID  PO), Take 1 tablet by mouth as needed (when  consuming dairy)., Disp: , Rfl:  .  losartan (COZAAR) 50 MG tablet, Take 50 mg by mouth daily., Disp: , Rfl:  .  metoprolol succinate (TOPROL XL) 25 MG 24 hr tablet, Take 1 tablet (25 mg total) by mouth daily., Disp: 90 tablet, Rfl: 0 .  montelukast (SINGULAIR) 10 MG tablet, Take 10 mg by mouth at bedtime., Disp: , Rfl:  .  OVER THE COUNTER MEDICATION, Take 1 tablet by mouth daily. Super beets, Disp: , Rfl:  .  psyllium (METAMUCIL) 58.6 % powder, Take 1 packet by mouth daily as needed (mild constipation)., Disp: , Rfl:   Orders Placed This Encounter  Procedures  . Hemoglobin and hematocrit, blood  . Basic metabolic panel  . Magnesium  . EKG 12-Lead  . PCV CAROTID DUPLEX (BILATERAL)   There are no Patient Instructions on file for this visit.   --Continue cardiac medications as reconciled in final medication list. --Return in about 4 weeks (around 09/18/2020) for Follow up, A. fib. Or sooner if needed. --Continue follow-up with your primary care physician regarding the management of your other chronic comorbid conditions.  Patient's questions and concerns were addressed to her satisfaction. She voices understanding of the instructions provided during this encounter.   This note was created using a voice recognition software as a result there may be grammatical errors inadvertently enclosed that do not reflect the nature of this encounter. Every attempt is made to correct such errors.  Rex Kras, [place], [facility]  Pager: [phone] Office: [phone]

## 2020-08-22 MED ORDER — METOPROLOL SUCCINATE ER 25 MG PO TB24: 25.0000 mg | ORAL_TABLET | Freq: Every day | ORAL | 0 refills | Status: DC

## 2020-08-23 ENCOUNTER — Other Ambulatory Visit: Payer: Self-pay

## 2020-08-23 DIAGNOSIS — I480 Paroxysmal atrial fibrillation: Secondary | ICD-10-CM

## 2020-08-27 DIAGNOSIS — I10 Essential (primary) hypertension: Secondary | ICD-10-CM | POA: Diagnosis not present

## 2020-08-27 DIAGNOSIS — Z7982 Long term (current) use of aspirin: Secondary | ICD-10-CM | POA: Diagnosis not present

## 2020-08-27 DIAGNOSIS — E785 Hyperlipidemia, unspecified: Secondary | ICD-10-CM | POA: Diagnosis not present

## 2020-08-27 DIAGNOSIS — I442 Atrioventricular block, complete: Secondary | ICD-10-CM | POA: Diagnosis not present

## 2020-08-27 DIAGNOSIS — D649 Anemia, unspecified: Secondary | ICD-10-CM | POA: Diagnosis not present

## 2020-08-27 DIAGNOSIS — Z87891 Personal history of nicotine dependence: Secondary | ICD-10-CM | POA: Diagnosis not present

## 2020-08-27 DIAGNOSIS — J4551 Severe persistent asthma with (acute) exacerbation: Secondary | ICD-10-CM | POA: Diagnosis not present

## 2020-08-27 DIAGNOSIS — Z950 Presence of cardiac pacemaker: Secondary | ICD-10-CM | POA: Diagnosis not present

## 2020-08-27 DIAGNOSIS — E871 Hypo-osmolality and hyponatremia: Secondary | ICD-10-CM | POA: Diagnosis not present

## 2020-09-03 DIAGNOSIS — Z87891 Personal history of nicotine dependence: Secondary | ICD-10-CM | POA: Diagnosis not present

## 2020-09-03 DIAGNOSIS — Z950 Presence of cardiac pacemaker: Secondary | ICD-10-CM | POA: Diagnosis not present

## 2020-09-03 DIAGNOSIS — D649 Anemia, unspecified: Secondary | ICD-10-CM | POA: Diagnosis not present

## 2020-09-03 DIAGNOSIS — E871 Hypo-osmolality and hyponatremia: Secondary | ICD-10-CM | POA: Diagnosis not present

## 2020-09-03 DIAGNOSIS — I10 Essential (primary) hypertension: Secondary | ICD-10-CM | POA: Diagnosis not present

## 2020-09-03 DIAGNOSIS — Z7982 Long term (current) use of aspirin: Secondary | ICD-10-CM | POA: Diagnosis not present

## 2020-09-03 DIAGNOSIS — J4551 Severe persistent asthma with (acute) exacerbation: Secondary | ICD-10-CM | POA: Diagnosis not present

## 2020-09-03 DIAGNOSIS — E785 Hyperlipidemia, unspecified: Secondary | ICD-10-CM | POA: Diagnosis not present

## 2020-09-03 DIAGNOSIS — I442 Atrioventricular block, complete: Secondary | ICD-10-CM | POA: Diagnosis not present

## 2020-09-04 DIAGNOSIS — E871 Hypo-osmolality and hyponatremia: Secondary | ICD-10-CM | POA: Diagnosis not present

## 2020-09-04 DIAGNOSIS — J4551 Severe persistent asthma with (acute) exacerbation: Secondary | ICD-10-CM | POA: Diagnosis not present

## 2020-09-04 DIAGNOSIS — E785 Hyperlipidemia, unspecified: Secondary | ICD-10-CM | POA: Diagnosis not present

## 2020-09-04 DIAGNOSIS — Z950 Presence of cardiac pacemaker: Secondary | ICD-10-CM | POA: Diagnosis not present

## 2020-09-04 DIAGNOSIS — Z7982 Long term (current) use of aspirin: Secondary | ICD-10-CM | POA: Diagnosis not present

## 2020-09-04 DIAGNOSIS — D649 Anemia, unspecified: Secondary | ICD-10-CM | POA: Diagnosis not present

## 2020-09-04 DIAGNOSIS — I442 Atrioventricular block, complete: Secondary | ICD-10-CM | POA: Diagnosis not present

## 2020-09-04 DIAGNOSIS — I10 Essential (primary) hypertension: Secondary | ICD-10-CM | POA: Diagnosis not present

## 2020-09-04 DIAGNOSIS — Z87891 Personal history of nicotine dependence: Secondary | ICD-10-CM | POA: Diagnosis not present

## 2020-09-10 DIAGNOSIS — I10 Essential (primary) hypertension: Secondary | ICD-10-CM | POA: Diagnosis not present

## 2020-09-10 DIAGNOSIS — Z950 Presence of cardiac pacemaker: Secondary | ICD-10-CM | POA: Diagnosis not present

## 2020-09-10 DIAGNOSIS — E871 Hypo-osmolality and hyponatremia: Secondary | ICD-10-CM | POA: Diagnosis not present

## 2020-09-10 DIAGNOSIS — E785 Hyperlipidemia, unspecified: Secondary | ICD-10-CM | POA: Diagnosis not present

## 2020-09-10 DIAGNOSIS — D649 Anemia, unspecified: Secondary | ICD-10-CM | POA: Diagnosis not present

## 2020-09-10 DIAGNOSIS — I442 Atrioventricular block, complete: Secondary | ICD-10-CM | POA: Diagnosis not present

## 2020-09-10 DIAGNOSIS — J4551 Severe persistent asthma with (acute) exacerbation: Secondary | ICD-10-CM | POA: Diagnosis not present

## 2020-09-10 DIAGNOSIS — Z7982 Long term (current) use of aspirin: Secondary | ICD-10-CM | POA: Diagnosis not present

## 2020-09-10 DIAGNOSIS — Z87891 Personal history of nicotine dependence: Secondary | ICD-10-CM | POA: Diagnosis not present

## 2020-09-11 ENCOUNTER — Ambulatory Visit: Payer: Medicare HMO

## 2020-09-11 ENCOUNTER — Other Ambulatory Visit: Payer: Self-pay

## 2020-09-11 DIAGNOSIS — R0989 Other specified symptoms and signs involving the circulatory and respiratory systems: Secondary | ICD-10-CM | POA: Diagnosis not present

## 2020-09-13 DIAGNOSIS — J4551 Severe persistent asthma with (acute) exacerbation: Secondary | ICD-10-CM | POA: Diagnosis not present

## 2020-09-13 DIAGNOSIS — Z7982 Long term (current) use of aspirin: Secondary | ICD-10-CM | POA: Diagnosis not present

## 2020-09-13 DIAGNOSIS — I10 Essential (primary) hypertension: Secondary | ICD-10-CM | POA: Diagnosis not present

## 2020-09-13 DIAGNOSIS — E785 Hyperlipidemia, unspecified: Secondary | ICD-10-CM | POA: Diagnosis not present

## 2020-09-13 DIAGNOSIS — E871 Hypo-osmolality and hyponatremia: Secondary | ICD-10-CM | POA: Diagnosis not present

## 2020-09-13 DIAGNOSIS — Z950 Presence of cardiac pacemaker: Secondary | ICD-10-CM | POA: Diagnosis not present

## 2020-09-13 DIAGNOSIS — I442 Atrioventricular block, complete: Secondary | ICD-10-CM | POA: Diagnosis not present

## 2020-09-13 DIAGNOSIS — D649 Anemia, unspecified: Secondary | ICD-10-CM | POA: Diagnosis not present

## 2020-09-13 DIAGNOSIS — Z87891 Personal history of nicotine dependence: Secondary | ICD-10-CM | POA: Diagnosis not present

## 2020-09-19 DIAGNOSIS — E785 Hyperlipidemia, unspecified: Secondary | ICD-10-CM | POA: Diagnosis not present

## 2020-09-19 DIAGNOSIS — J4551 Severe persistent asthma with (acute) exacerbation: Secondary | ICD-10-CM | POA: Diagnosis not present

## 2020-09-19 DIAGNOSIS — Z87891 Personal history of nicotine dependence: Secondary | ICD-10-CM | POA: Diagnosis not present

## 2020-09-19 DIAGNOSIS — J4520 Mild intermittent asthma, uncomplicated: Secondary | ICD-10-CM | POA: Diagnosis not present

## 2020-09-19 DIAGNOSIS — Z7982 Long term (current) use of aspirin: Secondary | ICD-10-CM | POA: Diagnosis not present

## 2020-09-19 DIAGNOSIS — E871 Hypo-osmolality and hyponatremia: Secondary | ICD-10-CM | POA: Diagnosis not present

## 2020-09-19 DIAGNOSIS — D649 Anemia, unspecified: Secondary | ICD-10-CM | POA: Diagnosis not present

## 2020-09-19 DIAGNOSIS — I10 Essential (primary) hypertension: Secondary | ICD-10-CM | POA: Diagnosis not present

## 2020-09-19 DIAGNOSIS — I442 Atrioventricular block, complete: Secondary | ICD-10-CM | POA: Diagnosis not present

## 2020-09-19 DIAGNOSIS — Z950 Presence of cardiac pacemaker: Secondary | ICD-10-CM | POA: Diagnosis not present

## 2020-09-24 DIAGNOSIS — E871 Hypo-osmolality and hyponatremia: Secondary | ICD-10-CM | POA: Diagnosis not present

## 2020-09-24 DIAGNOSIS — J4551 Severe persistent asthma with (acute) exacerbation: Secondary | ICD-10-CM | POA: Diagnosis not present

## 2020-09-24 DIAGNOSIS — I10 Essential (primary) hypertension: Secondary | ICD-10-CM | POA: Diagnosis not present

## 2020-09-24 DIAGNOSIS — Z7982 Long term (current) use of aspirin: Secondary | ICD-10-CM | POA: Diagnosis not present

## 2020-09-24 DIAGNOSIS — Z87891 Personal history of nicotine dependence: Secondary | ICD-10-CM | POA: Diagnosis not present

## 2020-09-24 DIAGNOSIS — I442 Atrioventricular block, complete: Secondary | ICD-10-CM | POA: Diagnosis not present

## 2020-09-24 DIAGNOSIS — E785 Hyperlipidemia, unspecified: Secondary | ICD-10-CM | POA: Diagnosis not present

## 2020-09-24 DIAGNOSIS — D649 Anemia, unspecified: Secondary | ICD-10-CM | POA: Diagnosis not present

## 2020-09-24 DIAGNOSIS — Z950 Presence of cardiac pacemaker: Secondary | ICD-10-CM | POA: Diagnosis not present

## 2020-09-30 DIAGNOSIS — I10 Essential (primary) hypertension: Secondary | ICD-10-CM | POA: Diagnosis not present

## 2020-09-30 DIAGNOSIS — I480 Paroxysmal atrial fibrillation: Secondary | ICD-10-CM | POA: Diagnosis not present

## 2020-09-30 DIAGNOSIS — J449 Chronic obstructive pulmonary disease, unspecified: Secondary | ICD-10-CM | POA: Diagnosis not present

## 2020-10-01 LAB — BASIC METABOLIC PANEL
BUN/Creatinine Ratio: 16 (ref 12–28)
BUN: 16 mg/dL (ref 10–36)
CO2: 22 mmol/L (ref 20–29)
Calcium: 9.5 mg/dL (ref 8.7–10.3)
Chloride: 96 mmol/L (ref 96–106)
Creatinine, Ser: 1.01 mg/dL — ABNORMAL HIGH (ref 0.57–1.00)
Glucose: 67 mg/dL (ref 65–99)
Potassium: 4.1 mmol/L (ref 3.5–5.2)
Sodium: 132 mmol/L — ABNORMAL LOW (ref 134–144)
eGFR: 52 mL/min/{1.73_m2} — ABNORMAL LOW (ref >59)

## 2020-10-01 LAB — HEMOGLOBIN AND HEMATOCRIT, BLOOD
Hematocrit: 34.2 % (ref 34.0–46.6)
Hemoglobin: 11.6 g/dL (ref 11.1–15.9)

## 2020-10-01 LAB — MAGNESIUM: Magnesium: 1.9 mg/dL (ref 1.6–2.3)

## 2020-10-03 ENCOUNTER — Encounter: Payer: Self-pay | Admitting: Cardiology

## 2020-10-03 ENCOUNTER — Ambulatory Visit: Payer: Medicare HMO | Admitting: Cardiology

## 2020-10-03 ENCOUNTER — Other Ambulatory Visit: Payer: Self-pay

## 2020-10-03 VITALS — BP 139/42 | HR 72 | Temp 97.9°F | Resp 16 | Ht <= 58 in | Wt 138.0 lb

## 2020-10-03 DIAGNOSIS — I480 Paroxysmal atrial fibrillation: Secondary | ICD-10-CM

## 2020-10-03 DIAGNOSIS — I10 Essential (primary) hypertension: Secondary | ICD-10-CM | POA: Diagnosis not present

## 2020-10-03 DIAGNOSIS — E7800 Pure hypercholesterolemia, unspecified: Secondary | ICD-10-CM

## 2020-10-03 DIAGNOSIS — I700 Atherosclerosis of aorta: Secondary | ICD-10-CM | POA: Diagnosis not present

## 2020-10-03 DIAGNOSIS — I442 Atrioventricular block, complete: Secondary | ICD-10-CM

## 2020-10-03 DIAGNOSIS — Z7901 Long term (current) use of anticoagulants: Secondary | ICD-10-CM

## 2020-10-03 DIAGNOSIS — Z950 Presence of cardiac pacemaker: Secondary | ICD-10-CM | POA: Diagnosis not present

## 2020-10-03 DIAGNOSIS — Z87891 Personal history of nicotine dependence: Secondary | ICD-10-CM

## 2020-10-03 DIAGNOSIS — I6523 Occlusion and stenosis of bilateral carotid arteries: Secondary | ICD-10-CM

## 2020-10-03 NOTE — Progress Notes (Signed)
ID:  Sarah Burns, DOB 07-20-1924, MRN [id number]  PCP:  Lucianne Lei, MD  Cardiologist:  Rex Kras, DO, [facility] (established care 03/11/2020) Former Cardiology Providers: Dr. Anders Simmonds at [facility]  Date: 10/03/20 Last Office Visit: 08/21/2020   Chief Complaint  Patient presents with   Atrial Fibrillation   Follow-up   Shortness of Breath    HPI  Sarah Burns is a 87 y.o. female who presents to the office with a chief complaint of " shortness of breath and atrial fibrillation management" Patient's past medical history and cardiovascular risk factors include: Paroxysmal atrial fibrillation, presence of a permanent pacemaker, hypertension, hypercholesterolemia, former smoker, postmenopausal female, advanced age.  She is referred to the office at the request of Lucianne Lei, MD for evaluation of heart disease.  Patient is accompanied by her daughter [name] at today's office visit.   She provides verbal consent in regards to discussing her medical history and plan of care in the presence of her daughter.  Patient moved from [place] to [place] in April 2021 to be closer to her daughter.  She establish care with [facility] cardiovascular for long-term follow-up and management of her pacemaker.  She had a  Advisa DR MRI SureScan Pacemaker from [employer] implanted on January 29, 2014.   Most recent pacemaker interrogation report reviewed with the patient and her daughter at today's visit.  She was hospitalized in April 2022 for shortness of breath most likely secondary to asthma exacerbation; however she had a small delta change in high sensitive troponins at that time.  At the last office visit the shared decision was to not proceed with stress test as she remained asymptomatic.  Patient states that her shortness of breath has improved significantly with the initiation of prednisone by her other providers.  She does not have any chest pain at rest or with effort related  activities.  No hospitalizations or urgent care visits for cardiovascular symptoms.  She is currently on Eliquis for thromboembolic prophylaxis given her underlying atrial fibrillation.  Patient's ventricular rate remains stable and does not endorse any evidence of bleeding.   At the last office visit patient was noted to have a carotid bruit and therefore underwent a carotid duplex.  She is noted to have bilateral carotid artery stenosis left greater than right.  ALLERGIES: Allergies  Allergen Reactions   Lactose Intolerance (Gi) Nausea And Vomiting and Other (See Comments)    Stomach pain   Penicillins Other (See Comments)    Unknown reaction     MEDICATION LIST PRIOR TO VISIT: Current Meds  Medication Sig   acetaminophen (TYLENOL) 500 MG tablet Take 500 mg by mouth daily as needed for headache (pain).   albuterol (VENTOLIN HFA) 108 (90 Base) MCG/ACT inhaler Inhale 2 puffs into the lungs every 6 (six) hours as needed for wheezing or shortness of breath.   amLODipine (NORVASC) 10 MG tablet Take 10 mg by mouth daily.   apixaban (ELIQUIS) 5 MG TABS tablet Take 1 tablet (5 mg total) by mouth 2 (two) times daily.   ASPIRIN LOW DOSE 81 MG EC tablet TAKE 1 TABLET(81 MG) BY MOUTH DAILY. SWALLOW WHOLE   atorvastatin (LIPITOR) 80 MG tablet Take 80 mg by mouth daily.   Calcium Carb-Cholecalciferol (CALCIUM 600-D PO) Take 1 tablet by mouth 2 (two) times daily.   diclofenac Sodium (VOLTAREN) 1 % GEL Apply 1 application topically 2 (two) times daily as needed (knee pain).   guaiFENesin (MUCINEX) 600 MG 12 hr tablet  Take 1 tablet (600 mg total) by mouth 2 (two) times daily as needed.   ipratropium-albuterol (DUONEB) 0.5-2.5 (3) MG/3ML SOLN Take 3 mLs by nebulization every 6 (six) hours as needed.   Lactase (LACTAID PO) Take 1 tablet by mouth as needed (when consuming dairy).   losartan (COZAAR) 50 MG tablet Take 50 mg by mouth daily.   montelukast (SINGULAIR) 10 MG tablet Take 10 mg by mouth at  bedtime.   OVER THE COUNTER MEDICATION Take 1 tablet by mouth daily. Super beets   predniSONE (DELTASONE) 20 MG tablet Take by mouth.     PAST MEDICAL HISTORY: Past Medical History:  Diagnosis Date   Acute cholecystitis 09/17/2019   Allergies    Asthma    Encounter for care of pacemaker 06/17/2020   Heart block AV complete ([place]) 06/17/2020   Hyperlipidemia    Hypertension    Pacemaker Dual chamber [employer] Advisa DR MRI SureScan 01/29/2014 06/17/2020   Paroxysmal A-fib ([place])     PAST SURGICAL HISTORY: Past Surgical History:  Procedure Laterality Date   CHOLECYSTECTOMY N/A 09/19/2019   Procedure: LAPAROSCOPIC CHOLECYSTECTOMY;  Surgeon: Ralene Ok, MD;  Location: [facility] OR;  Service: General;  Laterality: N/A;   GROIN DISSECTION     INSERT / REPLACE / REMOVE PACEMAKER     nasal polyp removal      FAMILY HISTORY: The patient family history includes Allergies in an other family member; Asthma in her brother and another family member. No family history of premature coronary disease or sudden cardiac death.  SOCIAL HISTORY:  The patient  reports that she has quit smoking. Her smoking use included cigarettes. She has a 5.00 pack-year smoking history. She has never used smokeless tobacco. She reports previous alcohol use.  REVIEW OF SYSTEMS: Review of Systems  Constitutional: Negative for chills and fever.  HENT:  Negative for hoarse voice and nosebleeds.   Eyes:  Negative for discharge, double vision and pain.  Cardiovascular:  Negative for chest pain, claudication, dyspnea on exertion, leg swelling, near-syncope, orthopnea, palpitations, paroxysmal nocturnal dyspnea and syncope.  Respiratory:  Positive for shortness of breath (Chronic and stable). Negative for hemoptysis and wheezing.   Musculoskeletal:  Negative for muscle cramps and myalgias.  Gastrointestinal:  Negative for abdominal pain, constipation, diarrhea, hematemesis, hematochezia, melena, nausea and vomiting.   Neurological:  Negative for dizziness and light-headedness.  PHYSICAL EXAM: Vitals with BMI 10/03/2020 08/21/2020 07/29/2020  Height '4\' 3"'  '4\' 3"'  -  Weight 138 lbs 139 lbs -  BMI 99.24 26.8 -  Systolic 341 962 229  Diastolic 42 64 53  Pulse 72 83 88   CONSTITUTIONAL: Age-appropriate female, hemodynamically stable, well-developed and well-nourished. No acute distress.  SKIN: Skin is warm and dry. No rash noted. No cyanosis. No pallor. No jaundice HEAD: Normocephalic and atraumatic.  EYES: No scleral icterus MOUTH/THROAT: Moist oral membranes.  NECK: No JVD present. No thyromegaly noted. Bilateral carotid bruits  LYMPHATIC: No visible cervical adenopathy.  CHEST Normal respiratory effort. No intercostal retractions.  Pacemaker located in the left infraclavicular region.  Site is clean dry and intact. LUNGS: Clear to auscultation bilaterally.  No stridor. No wheezes. No rales.  CARDIOVASCULAR: Positive S1-S2, regular, soft holosystolic murmur heard at the apex, no gallops or rubs. ABDOMINAL: Obese, soft, nontender, nondistended, positive bowel sounds all 4 quadrants no apparent ascites.  EXTREMITIES: No peripheral edema  HEMATOLOGIC: No significant bruising NEUROLOGIC: Oriented to person, place, and time. Nonfocal. Normal muscle tone.  PSYCHIATRIC: Normal mood and affect. Normal  behavior. Cooperative  RADIOLOGY: Chest x-ray 03/22/2020: Left pacer in place as described above. No acute cardiopulmonary disease. Bibasilar scarring.  Aortic atherosclerosis.  CARDIAC DATABASE: Scheduled Remote pacemaker check  06/17/2020: 101 mode switch episodes.  EGMs reveal paroxysmal atrial fibrillation, latest episode 09/24/2019.  Longest episode 09/21/2019 for 2 hours and 40 minutes.  There are 4 ventricular high rate episodes, EGM consistent with brief less than 2-second NSVT. AP 18%, VP 100%. Longevity 2 years.  Normal pacemaker function.   Scheduled  In office pacemaker check 06/17/2020 (Pacemaker  Dual chamber [employer] Advisa DR MRI SureScan 01/29/2014) Single (S)/Dual (D)/BV: D. Presenting ASVP @ 82/min. Pacemaker dependant:  YES. Underlying AS VP @ 40/min. AP 18%, VP 100%. AMS Episodes 4.  AT/AF burden < 0.1% since Dec 2019 . Longest 2H 40 M of A. Fib. Latest 09/24/2019 for 7 minutes. HVR 4. Longest 2Sec of NSVT. Longevity 2 Years. Magnet rate: >85%. Lead measurements: Stable. Histogram: Low (L)/normal (N)/high (H)  Normal. Patient activity Low.   EKG: 08/21/2020: Normal sinus rhythm, 74 bpm, poor R wave progression, atrial sensed ventricular paced rhythm.  Echocardiogram: 07/28/2020: LVEF 65 to 70%, mild LVH, diastolic parameters indeterminate, moderately dilated left atrium, moderately dilated right atrium, aortic valve sclerosis with mild stenosis.   Stress Testing: No results found for this or any previous visit from the past 1095 days.  Heart Catheterization: None  Carotid artery duplex 09/11/2020:  Stenosis in the right internal carotid artery (50-69%), upper limit of the spectrum.  Stenosis in the right external carotid artery (<50%).  Stenosis in the left internal carotid artery (50-69%). The left PSV internal/common carotid artery ratio of 4.13 is consistent with a stenosis  of >70%.  Stenosis in the left external carotid artery (<50%).  Antegrade right vertebral artery flow. Antegrade left vertebral artery flow.  Follow up in six months is appropriate if clinically indicated.  LABORATORY DATA: CBC Latest Ref Rng & Units 09/30/2020 07/29/2020 07/27/2020  WBC 4.0 - 10.5 K/uL - 12.7(H) 8.6  Hemoglobin 11.1 - 15.9 g/dL 11.6 9.1(L) 11.2(L)  Hematocrit 34.0 - 46.6 % 34.2 27.5(L) 35.0(L)  Platelets 150 - 400 K/uL - 212 270    CMP Latest Ref Rng & Units 09/30/2020 07/29/2020 07/28/2020  Glucose 65 - 99 mg/dL 67 151(H) 262(H)  BUN 10 - 36 mg/dL '16 17 16  ' Creatinine 0.57 - 1.00 mg/dL 1.01(H) 1.00 1.30(H)  Sodium 134 - 144 mmol/L 132(L) 130(L) 131(L)  Potassium 3.5 - 5.2  mmol/L 4.1 4.5 3.9  Chloride 96 - 106 mmol/L 96 99 99  CO2 20 - 29 mmol/L 22 22 18(L)  Calcium 8.7 - 10.3 mg/dL 9.5 9.9 9.4  Total Protein 6.5 - 8.1 g/dL - - -  Total Bilirubin 0.3 - 1.2 mg/dL - - -  Alkaline Phos 38 - 126 U/L - - -  AST 15 - 41 U/L - - -  ALT 0 - 44 U/L - - -    Lipid Panel  No results found for: CHOL, TRIG, HDL, CHOLHDL, VLDL, LDLCALC, LDLDIRECT, LABVLDL  No components found for: NTPROBNP No results for input(s): PROBNP in the last 8760 hours. No results for input(s): TSH in the last 8760 hours.  BMP Recent Labs    07/27/20 2245 07/28/20 0933 07/29/20 0111 09/30/20 1022  NA 130* 131* 130* 132*  K 4.1 3.9 4.5 4.1  CL 100 99 99 96  CO2 22 18* 22 22  GLUCOSE 179* 262* 151* 67  BUN '15 16 17 16  ' CREATININE  1.27* 1.30* 1.00 1.01*  CALCIUM 9.3 9.4 9.9 9.5  GFRNONAA 39* 38* 53*  --     HEMOGLOBIN A1C Lab Results  Component Value Date   HGBA1C 5.6 07/29/2020   MPG 114 07/29/2020     External Labs: Collected: 05/13/2020 labs received from her PCPs office after the appointment. Creatinine 0.95 mg/dL. eGFR: 60 mL/min per 1.73 m AST 20, ALT 16, alkaline phosphatase 76 Hemoglobin 10.6 g/dL, hematocrit 32.4% Sodium 132, potassium 4.4, chloride 100, bicarb 26 Lipid profile: Total cholesterol 184, triglycerides 67, HDL 87, LDL 83, non-HDL 97  IMPRESSION:    ICD-10-CM   1. Paroxysmal atrial fibrillation (HCC)  I48.0     2. Long term (current) use of anticoagulants  Z79.01     3. Pacemaker Dual chamber [employer] Advisa DR MRI SureScan 01/29/2014  Z95.0     4. Heart block AV complete (HCC)  I44.2     5. Essential hypertension, benign  I10     6. Bilateral carotid artery stenosis  I65.23 CT ANGIO HEAD W OR WO CONTRAST    CT ANGIO NECK W OR WO CONTRAST    7. Hypercholesteremia  E78.00     8. Former smoker  Z87.891     3. Atherosclerosis of aorta (HCC)  I70.0        RECOMMENDATIONS: Kinsleigh Ludolph is a 87 y.o. female whose past medical history  and cardiac risk factors include: Paroxysmal atrial fibrillation, presence of a permanent pacemaker, hypertension, hypercholesterolemia, former smoker, postmenopausal female, advanced age.  Bilateral carotid artery stenosis: Asymptomatic. Carotid duplex results reviewed. Disease involving the left ICA more progressive than the right ICA. Continue aspirin and statin therapy. Shared decision is to proceed with CTA of the head and neck to further evaluate the disease progression prior to considering vascular consultation.  Presence of permanent pacemaker: Pacemaker implanted due to second-degree and high degree AV block.   Most recent pacemaker interrogation report reviewed. Patient is V paced 100% of the time and her battery life is for approximately 2 years.  Paroxysmal atrial fibrillation: Noted on pacemaker interrogation. Rate control: Metoprolol 25 mg p.o. daily Rhythm control: N/A Thromboembolic prophylaxis: Eliquis Despite her advanced age patient is very active, independent, and does rate activities of daily living.  We had a long discussion with regards to risks, benefits, and alternatives to oral anticoagulation.  Patient wishes to initiate oral anticoagulation for thromboembolic prophylaxis.  Long-term oral anticoagulation: Indication: Paroxysmal atrial fibrillation CHA2DS2-VASc SCORE is 5 which correlates to 6.7% risk of stroke per year (age greater than 75, hypertension, aortic atherosclerosis, gender). Most recent labs 09/30/2020 independently reviewed.  Hemoglobin trending up.  Kidney function relatively stable. Patient does not endorse any evidence of bleeding.  Shortness of breath: Improving. Reviewed the results of the last echocardiogram.  Symptoms have improved after the initiation of prednisone. Patient was noted to have episodes of NSVT on pacemaker interrogation report.  We discussed proceeding with stress testing; however, patient refuses as she is asymptomatic and  given her age.    Aortic atherosclerosis: Continue aspirin and statin therapy.  Benign essential hypertension: Office blood pressure within acceptable range.  Medications reconciled. Currently managed by primary care provider.  Hypercholesterolemia: Continue statin therapy. Currently managed by primary care provider.  Former smoker: Educated on the importance of continued smoking cessation.   FINAL MEDICATION LIST END OF ENCOUNTER: No orders of the defined types were placed in this encounter.   Medications Discontinued During This Encounter  Medication Reason   cetirizine (ZYRTEC)  10 MG tablet Error   fluticasone-salmeterol (ADVAIR HFA) 115-21 MCG/ACT inhaler Error   metoprolol succinate (TOPROL XL) 25 MG 24 hr tablet Error   psyllium (METAMUCIL) 58.6 % powder Error     Current Outpatient Medications:    acetaminophen (TYLENOL) 500 MG tablet, Take 500 mg by mouth daily as needed for headache (pain)., Disp: , Rfl:    albuterol (VENTOLIN HFA) 108 (90 Base) MCG/ACT inhaler, Inhale 2 puffs into the lungs every 6 (six) hours as needed for wheezing or shortness of breath., Disp: 8 g, Rfl: 1   amLODipine (NORVASC) 10 MG tablet, Take 10 mg by mouth daily., Disp: , Rfl:    apixaban (ELIQUIS) 5 MG TABS tablet, Take 1 tablet (5 mg total) by mouth 2 (two) times daily., Disp: 180 tablet, Rfl: 0   ASPIRIN LOW DOSE 81 MG EC tablet, TAKE 1 TABLET(81 MG) BY MOUTH DAILY. SWALLOW WHOLE, Disp: 30 tablet, Rfl: 11   atorvastatin (LIPITOR) 80 MG tablet, Take 80 mg by mouth daily., Disp: , Rfl:    Calcium Carb-Cholecalciferol (CALCIUM 600-D PO), Take 1 tablet by mouth 2 (two) times daily., Disp: , Rfl:    diclofenac Sodium (VOLTAREN) 1 % GEL, Apply 1 application topically 2 (two) times daily as needed (knee pain)., Disp: , Rfl:    guaiFENesin (MUCINEX) 600 MG 12 hr tablet, Take 1 tablet (600 mg total) by mouth 2 (two) times daily as needed., Disp: 14 tablet, Rfl: 0   ipratropium-albuterol (DUONEB) 0.5-2.5  (3) MG/3ML SOLN, Take 3 mLs by nebulization every 6 (six) hours as needed., Disp: 360 mL, Rfl: 1   Lactase (LACTAID PO), Take 1 tablet by mouth as needed (when consuming dairy)., Disp: , Rfl:    losartan (COZAAR) 50 MG tablet, Take 50 mg by mouth daily., Disp: , Rfl:    montelukast (SINGULAIR) 10 MG tablet, Take 10 mg by mouth at bedtime., Disp: , Rfl:    OVER THE COUNTER MEDICATION, Take 1 tablet by mouth daily. Super beets, Disp: , Rfl:    predniSONE (DELTASONE) 20 MG tablet, Take by mouth., Disp: , Rfl:   Orders Placed This Encounter  Procedures   CT ANGIO HEAD W OR WO CONTRAST   CT ANGIO NECK W OR WO CONTRAST   There are no Patient Instructions on file for this visit.   --Continue cardiac medications as reconciled in final medication list. --Return in about 4 weeks (around 10/31/2020) for Follow up carotid disease. . Or sooner if needed. --Continue follow-up with your primary care physician regarding the management of your other chronic comorbid conditions.  Patient's questions and concerns were addressed to her satisfaction. She voices understanding of the instructions provided during this encounter.   This note was created using a voice recognition software as a result there may be grammatical errors inadvertently enclosed that do not reflect the nature of this encounter. Every attempt is made to correct such errors.  Rex Kras, [place], [facility]  Pager: [phone] Office: [phone]

## 2020-10-08 DIAGNOSIS — I442 Atrioventricular block, complete: Secondary | ICD-10-CM | POA: Diagnosis not present

## 2020-10-08 DIAGNOSIS — Z45018 Encounter for adjustment and management of other part of cardiac pacemaker: Secondary | ICD-10-CM | POA: Diagnosis not present

## 2020-10-08 DIAGNOSIS — Z950 Presence of cardiac pacemaker: Secondary | ICD-10-CM | POA: Diagnosis not present

## 2020-10-16 DIAGNOSIS — R69 Illness, unspecified: Secondary | ICD-10-CM | POA: Diagnosis not present

## 2020-10-16 DIAGNOSIS — I739 Peripheral vascular disease, unspecified: Secondary | ICD-10-CM | POA: Diagnosis not present

## 2020-10-16 DIAGNOSIS — E785 Hyperlipidemia, unspecified: Secondary | ICD-10-CM | POA: Diagnosis not present

## 2020-10-16 DIAGNOSIS — M130 Polyarthritis, unspecified: Secondary | ICD-10-CM | POA: Diagnosis not present

## 2020-10-16 DIAGNOSIS — J4520 Mild intermittent asthma, uncomplicated: Secondary | ICD-10-CM | POA: Diagnosis not present

## 2020-10-16 DIAGNOSIS — R609 Edema, unspecified: Secondary | ICD-10-CM | POA: Diagnosis not present

## 2020-10-16 DIAGNOSIS — I10 Essential (primary) hypertension: Secondary | ICD-10-CM | POA: Diagnosis not present

## 2020-10-23 ENCOUNTER — Ambulatory Visit
Admission: RE | Admit: 2020-10-23 | Discharge: 2020-10-23 | Disposition: A | Discharge status: Home / Self Care | Payer: Medicare HMO | Source: Ambulatory Visit | Attending: Cardiology | Admitting: Cardiology

## 2020-10-23 DIAGNOSIS — Q283 Other malformations of cerebral vessels: Secondary | ICD-10-CM | POA: Diagnosis not present

## 2020-10-23 DIAGNOSIS — I6523 Occlusion and stenosis of bilateral carotid arteries: Secondary | ICD-10-CM

## 2020-10-23 DIAGNOSIS — M47812 Spondylosis without myelopathy or radiculopathy, cervical region: Secondary | ICD-10-CM | POA: Diagnosis not present

## 2020-10-23 DIAGNOSIS — I700 Atherosclerosis of aorta: Secondary | ICD-10-CM | POA: Diagnosis not present

## 2020-10-23 MED ORDER — IOPAMIDOL (ISOVUE-370) INJECTION 76%
75.0000 mL | Freq: Once | INTRAVENOUS | Status: AC | PRN
  Administered 2020-10-23: 75 mL via INTRAVENOUS

## 2020-10-31 ENCOUNTER — Encounter: Payer: Self-pay | Admitting: Cardiology

## 2020-10-31 ENCOUNTER — Other Ambulatory Visit: Payer: Self-pay

## 2020-10-31 ENCOUNTER — Ambulatory Visit: Payer: Medicare HMO | Admitting: Cardiology

## 2020-10-31 VITALS — BP 143/56 | HR 86 | Temp 98.0°F | Resp 16 | Ht <= 58 in | Wt 136.8 lb

## 2020-10-31 DIAGNOSIS — Z950 Presence of cardiac pacemaker: Secondary | ICD-10-CM | POA: Diagnosis not present

## 2020-10-31 DIAGNOSIS — Z7901 Long term (current) use of anticoagulants: Secondary | ICD-10-CM | POA: Diagnosis not present

## 2020-10-31 DIAGNOSIS — I10 Essential (primary) hypertension: Secondary | ICD-10-CM | POA: Diagnosis not present

## 2020-10-31 DIAGNOSIS — I442 Atrioventricular block, complete: Secondary | ICD-10-CM | POA: Diagnosis not present

## 2020-10-31 DIAGNOSIS — I700 Atherosclerosis of aorta: Secondary | ICD-10-CM

## 2020-10-31 DIAGNOSIS — Z87891 Personal history of nicotine dependence: Secondary | ICD-10-CM | POA: Diagnosis not present

## 2020-10-31 DIAGNOSIS — E7800 Pure hypercholesterolemia, unspecified: Secondary | ICD-10-CM | POA: Diagnosis not present

## 2020-10-31 DIAGNOSIS — I6523 Occlusion and stenosis of bilateral carotid arteries: Secondary | ICD-10-CM

## 2020-10-31 DIAGNOSIS — I480 Paroxysmal atrial fibrillation: Secondary | ICD-10-CM

## 2020-10-31 MED ORDER — METOPROLOL SUCCINATE ER 25 MG PO TB24: 25.0000 mg | ORAL_TABLET | Freq: Every morning | ORAL | 0 refills | Status: DC

## 2020-10-31 NOTE — Progress Notes (Signed)
ID:  Sarah Burns, DOB 03/14/1925, MRN [id number]  PCP:  Sarah Lei, MD  Cardiologist:  Sarah Kras, DO, [facility] (established care 03/11/2020) Former Cardiology Providers: Dr. Anders Burns at [facility]  Date: 10/31/20 Last Office Visit: 10/03/2020  Chief Complaint  Patient presents with   Bilateral carotid artery stenosis   Follow-up    HPI  Sarah Burns is a 87 y.o. female who presents to the office with a chief complaint of " follow-up regarding carotid disease management. " Patient's past medical history and cardiovascular risk factors include: Paroxysmal atrial fibrillation, presence of a permanent pacemaker, hypertension, hypercholesterolemia, former smoker, bilateral asymptomatic carotid artery disease, postmenopausal female, advanced age.  She is referred to the office at the request of Sarah Lei, MD for evaluation of heart disease.  Patient is accompanied by her daughter Sarah Burns at today's office visit.   She provides verbal consent in regards to discussing her medical history and plan of care in the presence of her daughter.  Patient moved from [place] to [place] in April 2021 to be closer to her daughter.  She establish care with [facility] for long-term follow-up and management of her pacemaker.  She had a  Advisa DR MRI SureScan Pacemaker from [employer] implanted on January 29, 2014.   Most recent pacemaker interrogation report reviewed with the patient and her daughter at today's visit.  Patient was noted to have carotid bruits on physical examination and followed up with a carotid duplex which noted bilateral carotid disease.  She has undergone a CTA of head and neck since last office visit results reviewed with her in great detail and noted below for further reference.  With regards to paroxysmal atrial fibrillation patient continues to be on AV nodal blocking agents as well as thromboembolic prophylaxis for stroke prevention.  She does  not endorse any evidence of bleeding.  On her pacemaker she was noted to have NSVT and she also had a recent hospitalization for shortness of breath.  Despite her age she is fairly active and therefore we discussed undergoing an ischemic evaluation.  At the last office visit she had refused to do so and she continues to echo the same.  ALLERGIES: Allergies  Allergen Reactions   Lactose Intolerance (Gi) Nausea And Vomiting and Other (See Comments)    Stomach pain   Penicillins Other (See Comments)    Unknown reaction     MEDICATION LIST PRIOR TO VISIT: Current Meds  Medication Sig   acetaminophen (TYLENOL) 500 MG tablet Take 500 mg by mouth daily as needed for headache (pain).   amLODipine (NORVASC) 10 MG tablet Take 10 mg by mouth daily.   apixaban (ELIQUIS) 5 MG TABS tablet Take 1 tablet (5 mg total) by mouth 2 (two) times daily.   ASPIRIN LOW DOSE 81 MG EC tablet TAKE 1 TABLET(81 MG) BY MOUTH DAILY. SWALLOW WHOLE   atorvastatin (LIPITOR) 80 MG tablet Take 80 mg by mouth daily.   BREZTRI AEROSPHERE 160-9-4.8 MCG/ACT AERO SMARTSIG:2 Puff(s) By Mouth Twice Daily   Calcium Carb-Cholecalciferol (CALCIUM 600-D PO) Take 1 tablet by mouth 2 (two) times daily.   guaiFENesin (MUCINEX) 600 MG 12 hr tablet Take 1 tablet (600 mg total) by mouth 2 (two) times daily as needed.   hydrochlorothiazide (HYDRODIURIL) 12.5 MG tablet Take 12.5 mg by mouth every morning.   ipratropium-albuterol (DUONEB) 0.5-2.5 (3) MG/3ML SOLN Take 3 mLs by nebulization every 6 (six) hours as needed.   Lactase (LACTAID PO) Take 1 tablet  by mouth as needed (when consuming dairy).   losartan (COZAAR) 50 MG tablet Take 50 mg by mouth daily.   metoprolol succinate (TOPROL XL) 25 MG 24 hr tablet Take 1 tablet (25 mg total) by mouth every morning. Hold if systolic blood pressure (top number) less than 100 mmHg or pulse less than 60 bpm.   montelukast (SINGULAIR) 10 MG tablet Take 10 mg by mouth at bedtime.   OVER THE COUNTER  MEDICATION Take 1 tablet by mouth daily. Super beets     PAST MEDICAL HISTORY: Past Medical History:  Diagnosis Date   Acute cholecystitis 09/17/2019   Allergies    Asthma    Bilateral carotid artery disease ([place])    Encounter for care of pacemaker 06/17/2020   Heart block AV complete ([place]) 06/17/2020   Hyperlipidemia    Hypertension    Pacemaker Dual chamber [employer]onic Advisa DR MRI SureScan 01/29/2014 06/17/2020   Paroxysmal A-fib ([place])     PAST SURGICAL HISTORY: Past Surgical History:  Procedure Laterality Date   CHOLECYSTECTOMY N/A 09/19/2019   Procedure: LAPAROSCOPIC CHOLECYSTECTOMY;  Surgeon: Ralene Ok, MD;  Location: [place];  Service: General;  Laterality: N/A;   GROIN DISSECTION     INSERT / REPLACE / REMOVE PACEMAKER     nasal polyp removal      FAMILY HISTORY: The patient family history includes Allergies in an other family member; Asthma in her brother and another family member. No family history of premature coronary disease or sudden cardiac death.  SOCIAL HISTORY:  The patient  reports that she has quit smoking. Her smoking use included cigarettes. She has a 5.00 pack-year smoking history. She has never used smokeless tobacco. She reports previous alcohol use.  REVIEW OF SYSTEMS: Review of Systems  Constitutional: Negative for chills and fever.  HENT:  Negative for hoarse voice and nosebleeds.   Eyes:  Negative for discharge, double vision and pain.  Cardiovascular:  Negative for chest pain, claudication, dyspnea on exertion, leg swelling, near-syncope, orthopnea, palpitations, paroxysmal nocturnal dyspnea and syncope.  Respiratory:  Positive for shortness of breath (Chronic and stable). Negative for hemoptysis and wheezing.   Musculoskeletal:  Negative for muscle cramps and myalgias.  Gastrointestinal:  Negative for abdominal pain, constipation, diarrhea, hematemesis, hematochezia, melena, nausea and vomiting.  Neurological:  Negative for dizziness and  light-headedness.  PHYSICAL EXAM: Vitals with BMI 10/31/2020 10/03/2020 08/21/2020  Height 4' 3" 4' 3" 4' 3"  Weight 136 lbs 13 oz 138 lbs 139 lbs  BMI 37 57.01 77.9  Systolic 390 300 923  Diastolic 56 42 64  Pulse 86 72 83   CONSTITUTIONAL: Age-appropriate female, hemodynamically stable, well-developed and well-nourished. No acute distress.  SKIN: Skin is warm and dry. No rash noted. No cyanosis. No pallor. No jaundice HEAD: Normocephalic and atraumatic.  EYES: No scleral icterus MOUTH/THROAT: Moist oral membranes.  NECK: No JVD present. No thyromegaly noted. Bilateral carotid bruits  LYMPHATIC: No visible cervical adenopathy.  CHEST Normal respiratory effort. No intercostal retractions.  Pacemaker located in the left infraclavicular region.  Site is clean dry and intact. LUNGS: Clear to auscultation bilaterally.  No stridor. No wheezes. No rales.  CARDIOVASCULAR: Positive S1-S2, regular, soft holosystolic murmur heard at the apex, no gallops or rubs. ABDOMINAL: Obese, soft, nontender, nondistended, positive bowel sounds all 4 quadrants no apparent ascites.  EXTREMITIES: No peripheral edema  HEMATOLOGIC: No significant bruising NEUROLOGIC: Oriented to person, place, and time. Nonfocal. Normal muscle tone.  PSYCHIATRIC: Normal mood and affect. Normal behavior.  Cooperative  RADIOLOGY: Chest x-ray 03/22/2020: Left pacer in place as described above. No acute cardiopulmonary disease. Bibasilar scarring.  Aortic atherosclerosis.  CARDIAC DATABASE: Scheduled Remote pacemaker check  06/17/2020: 101 mode switch episodes.  EGMs reveal paroxysmal atrial fibrillation, latest episode 09/24/2019.  Longest episode 09/21/2019 for 2 hours and 40 minutes.  There are 4 ventricular high rate episodes, EGM consistent with brief less than 2-second NSVT. AP 18%, VP 100%. Longevity 2 years.  Normal pacemaker function.   Scheduled  In office pacemaker check 06/17/2020 (Pacemaker Dual chamber [employer]  Advisa DR MRI SureScan 01/29/2014) Single (S)/Dual (D)/BV: D. Presenting ASVP @ 82/min. Pacemaker dependant:  YES. Underlying AS VP @ 40/min. AP 18%, VP 100%. AMS Episodes 4.  AT/AF burden < 0.1% since Dec 2019 . Longest 2H 40 M of A. Fib. Latest 09/24/2019 for 7 minutes. HVR 4. Longest 2Sec of NSVT. Longevity 2 Years. Magnet rate: >85%. Lead measurements: Stable. Histogram: Low (L)/normal (N)/high (H)  Normal. Patient activity Low.   Remote pacemaker transmission 10/04/2020:  Longevity 2 years and 4 months.  AP 16%, VP 100%.  There were 2 brief NSVT episodes, longest 5 beats since last transmission on 07/09/2020.  There were no mode switches.  Normal pacemaker function.  No further atrial fibrillation since 09/24/2019.  EKG: 08/21/2020: Normal sinus rhythm, 74 bpm, poor R wave progression, atrial sensed ventricular paced rhythm.  Echocardiogram: 07/28/2020: LVEF 65 to 70%, mild LVH, diastolic parameters indeterminate, moderately dilated left atrium, moderately dilated right atrium, aortic valve sclerosis with mild stenosis.   Stress Testing: No results found for this or any previous visit from the past 1095 days.  Heart Catheterization: None  Carotid artery duplex 09/11/2020:  Stenosis in the right internal carotid artery (50-69%), upper limit of the spectrum.  Stenosis in the right external carotid artery (<50%).  Stenosis in the left internal carotid artery (50-69%). The left PSV internal/common carotid artery ratio of 4.13 is consistent with a stenosis  of >70%.  Stenosis in the left external carotid artery (<50%).  Antegrade right vertebral artery flow. Antegrade left vertebral artery flow.  Follow up in six months is appropriate if clinically indicated.  CTA head and neck with and without contrast: 10/24/2020 1. No acute intracranial abnormality.  Normal for age. 2. Less than 50% diameter stenosis proximal right internal carotid artery and moderate to severe stenosis origin of  right external carotid artery. Moderate stenosis right cavernous carotid due to calcific disease. 3. 50% diameter stenosis distal left common carotid artery and 60% diameter stenosis proximal left internal carotid artery. Moderate to severe stenosis origin of left external carotid artery. Moderate stenosis left cavernous carotid due to calcific disease. 4. Both vertebral arteries widely patent 5. No intracranial large vessel occlusion or flow limiting stenosis.  LABORATORY DATA: CBC Latest Ref Rng & Units 09/30/2020 07/29/2020 07/27/2020  WBC 4.0 - 10.5 K/uL - 12.7(H) 8.6  Hemoglobin 11.1 - 15.9 g/dL 11.6 9.1(L) 11.2(L)  Hematocrit 34.0 - 46.6 % 34.2 27.5(L) 35.0(L)  Platelets 150 - 400 K/uL - 212 270    CMP Latest Ref Rng & Units 09/30/2020 07/29/2020 07/28/2020  Glucose 65 - 99 mg/dL 67 151(H) 262(H)  BUN 10 - 36 mg/dL _0 Creatinine 0.57 - 1.00 mg/dL 1.01(H) 1.00 1.30(H)  Sodium 134 - 144 mmol/L 132(L) 130(L) 131(L)  Potassium 3.5 - 5.2 mmol/L 4.1 4.5 3.9  Chloride 96 - 106 mmol/L 96 99 99  CO2 20 - 29 mmol/L 22 22 18(L)  Calcium 8.7 -  10.3 mg/dL 9.5 9.9 9.4  Total Protein 6.5 - 8.1 g/dL - - -  Total Bilirubin 0.3 - 1.2 mg/dL - - -  Alkaline Phos 38 - 126 U/L - - -  AST 15 - 41 U/L - - -  ALT 0 - 44 U/L - - -    Lipid Panel  No results found for: CHOL, TRIG, HDL, CHOLHDL, VLDL, LDLCALC, LDLDIRECT, LABVLDL  No components found for: NTPROBNP No results for input(s): PROBNP in the last 8760 hours. No results for input(s): TSH in the last 8760 hours.  BMP Recent Labs    07/27/20 2245 07/28/20 0933 07/29/20 0111 09/30/20 1022  NA 130* 131* 130* 132*  K 4.1 3.9 4.5 4.1  CL 100 99 99 96  CO2 22 18* 22 22  GLUCOSE 179* 262* 151* 67  BUN _0 CREATININE 1.27* 1.30* 1.00 1.01*  CALCIUM 9.3 9.4 9.9 9.5  GFRNONAA 39* 38* 53*  --     HEMOGLOBIN A1C Lab Results  Component Value Date   HGBA1C 5.6 07/29/2020   MPG 114 07/29/2020     External Labs: Collected:  05/13/2020 labs received from her PCPs office after the appointment. Creatinine 0.95 mg/dL. eGFR: 60 mL/min per 1.73 m AST 20, ALT 16, alkaline phosphatase 76 Hemoglobin 10.6 g/dL, hematocrit 32.4% Sodium 132, potassium 4.4, chloride 100, bicarb 26 Lipid profile: Total cholesterol 184, triglycerides 67, HDL 87, LDL 83, non-HDL 97   IMPRESSION:    ICD-10-CM   1. Paroxysmal atrial fibrillation (HCC)  I48.0 metoprolol succinate (TOPROL XL) 25 MG 24 hr tablet    2. Long term (current) use of anticoagulants  Z79.01     3. Pacemaker Dual chamber Med[employer]onic Advisa DR MRI SureScan 01/29/2014  Z95.0     4. Heart block AV complete (HCC)  I44.2     5. Essential hypertension, benign  I10     6. Bilateral carotid artery stenosis  I65.23     7. Hypercholesteremia  E78.00     8. Former smoker  Z87.891     63. Atherosclerosis of aorta (HCC)  I70.0        RECOMMENDATIONS: Sarah Burns is a 87 y.o. female whose past medical history and cardiac risk factors include: Paroxysmal atrial fibrillation, presence of a permanent pacemaker, hypertension, hypercholesterolemia, former smoker, postmenopausal female, advanced age.  Bilateral carotid artery stenosis: Asymptomatic. Given the carotid duplex results patient underwent CTA head and neck with and without contrast results reviewed with the patient.  No hemodynamically significant stenosis involving the bilateral internal carotid arteries.  We will continue to monitor Continue aspirin and statin therapy.  Presence of permanent pacemaker: Pacemaker implanted due to second-degree and high degree AV block.   Most recent pacemaker interrogation report reviewed.  Paroxysmal atrial fibrillation: Noted on pacemaker interrogation. Rate control: Metoprolol 25 mg p.o. daily, refilled Rhythm control: N/A Thromboembolic prophylaxis: Eliquis  Long-term oral anticoagulation: Indication: Paroxysmal atrial fibrillation CHA2DS2-VASc SCORE is 5 which  correlates to 6.7% risk of stroke per year (age greater than 75, hypertension, aortic atherosclerosis/Carotid artery disease, gender). Patient does not endorse any evidence of bleeding.  Shortness of breath: Improving. Echo results reviewed as a part of today's office visit. Given her symptoms of shortness of breath and episodes of NSVT on her pacemaker we have discussed undergoing an ischemic evaluation.  She has refused stress test in the past and continues to refuse it at today's visit. Continue to monitor for ischemic symptoms.  Currently asymptomatic  Aortic atherosclerosis: Continue aspirin and statin therapy.  Benign essential hypertension: Office blood pressure within acceptable range.  Medications reconciled. Currently managed by primary care provider.  Hypercholesterolemia: Continue statin therapy. Currently managed by primary care provider.  Former smoker: Educated on the importance of continued smoking cessation.   FINAL MEDICATION LIST END OF ENCOUNTER: Meds ordered this encounter  Medications   metoprolol succinate (TOPROL XL) 25 MG 24 hr tablet    Sig: Take 1 tablet (25 mg total) by mouth every morning. Hold if systolic blood pressure (top number) less than 100 mmHg or pulse less than 60 bpm.    Dispense:  90 tablet    Refill:  0     Medications Discontinued During This Encounter  Medication Reason   albuterol (VENTOLIN HFA) 108 (90 Base) MCG/ACT inhaler Error   diclofenac Sodium (VOLTAREN) 1 % GEL Error   predniSONE (DELTASONE) 20 MG tablet Error     Current Outpatient Medications:    acetaminophen (TYLENOL) 500 MG tablet, Take 500 mg by mouth daily as needed for headache (pain)., Disp: , Rfl:    amLODipine (NORVASC) 10 MG tablet, Take 10 mg by mouth daily., Disp: , Rfl:    apixaban (ELIQUIS) 5 MG TABS tablet, Take 1 tablet (5 mg total) by mouth 2 (two) times daily., Disp: 180 tablet, Rfl: 0   ASPIRIN LOW DOSE 81 MG EC tablet, TAKE 1 TABLET(81 MG) BY MOUTH  DAILY. SWALLOW WHOLE, Disp: 30 tablet, Rfl: 11   atorvastatin (LIPITOR) 80 MG tablet, Take 80 mg by mouth daily., Disp: , Rfl:    BREZTRI AEROSPHERE 160-9-4.8 MCG/ACT AERO, SMARTSIG:2 Puff(s) By Mouth Twice Daily, Disp: , Rfl:    Calcium Carb-Cholecalciferol (CALCIUM 600-D PO), Take 1 tablet by mouth 2 (two) times daily., Disp: , Rfl:    guaiFENesin (MUCINEX) 600 MG 12 hr tablet, Take 1 tablet (600 mg total) by mouth 2 (two) times daily as needed., Disp: 14 tablet, Rfl: 0   hydrochlorothiazide (HYDRODIURIL) 12.5 MG tablet, Take 12.5 mg by mouth every morning., Disp: , Rfl:    ipratropium-albuterol (DUONEB) 0.5-2.5 (3) MG/3ML SOLN, Take 3 mLs by nebulization every 6 (six) hours as needed., Disp: 360 mL, Rfl: 1   Lactase (LACTAID PO), Take 1 tablet by mouth as needed (when consuming dairy)., Disp: , Rfl:    losartan (COZAAR) 50 MG tablet, Take 50 mg by mouth daily., Disp: , Rfl:    metoprolol succinate (TOPROL XL) 25 MG 24 hr tablet, Take 1 tablet (25 mg total) by mouth every morning. Hold if systolic blood pressure (top number) less than 100 mmHg or pulse less than 60 bpm., Disp: 90 tablet, Rfl: 0   montelukast (SINGULAIR) 10 MG tablet, Take 10 mg by mouth at bedtime., Disp: , Rfl:    OVER THE COUNTER MEDICATION, Take 1 tablet by mouth daily. Super beets, Disp: , Rfl:   No orders of the defined types were placed in this encounter.  There are no Patient Instructions on file for this visit.   --Continue cardiac medications as reconciled in final medication list. --Return in about 6 months (around 05/03/2021) for Follow up, A. fib, PPM. Or sooner if needed. --Continue follow-up with your primary care physician regarding the management of your other chronic comorbid conditions.  Patient's questions and concerns were addressed to her satisfaction. She voices understanding of the instructions provided during this encounter.   This note was created using a voice recognition software as a result there  may be grammatical errors inadvertently  enclosed that do not reflect the nature of this encounter. Every attempt is made to correct such errors.  Sarah Burns, [place], [facility]  Pager: [phone] Office: [phone]

## 2020-11-04 DIAGNOSIS — K429 Umbilical hernia without obstruction or gangrene: Secondary | ICD-10-CM | POA: Diagnosis not present

## 2020-11-07 ENCOUNTER — Encounter: Payer: Self-pay | Admitting: Pulmonary Disease

## 2020-11-07 ENCOUNTER — Other Ambulatory Visit: Payer: Self-pay

## 2020-11-07 ENCOUNTER — Ambulatory Visit (INDEPENDENT_AMBULATORY_CARE_PROVIDER_SITE_OTHER): Payer: Medicare HMO | Admitting: Pulmonary Disease

## 2020-11-07 VITALS — BP 128/62 | HR 80 | Ht <= 58 in | Wt 135.2 lb

## 2020-11-07 DIAGNOSIS — J4541 Moderate persistent asthma with (acute) exacerbation: Secondary | ICD-10-CM | POA: Diagnosis not present

## 2020-11-07 MED ORDER — PREDNISONE 10 MG PO TABS: ORAL_TABLET | ORAL | 0 refills | Status: AC

## 2020-11-07 MED ORDER — BREZTRI AEROSPHERE 160-9-4.8 MCG/ACT IN AERO: INHALATION_SPRAY | RESPIRATORY_TRACT | 5 refills | Status: AC

## 2020-11-07 NOTE — Patient Instructions (Addendum)
Prednisone taper:  Take 4 tablets (40 mg total) by mouth daily with breakfast for 3 days, THEN 3 tablets (30 mg total) daily with breakfast for 3 days, THEN 2 tablets (20 mg total) daily with breakfast for 3 days, THEN 1 tablet (10 mg total) daily withbreakfast for 3 days.   Use duoneb nebulizer treatments schedule twice daily over the next 1 week  Continue to use breztri inhaler 2 puffs twice dialy  Continue to use albuterol inhaler 1-2 puffs every 4-6 hours as needed

## 2020-11-07 NOTE — Progress Notes (Signed)
Synopsis: Referred in July 2022 for asthma by Renaye RakersVeita Bland, MD  Subjective:   PATIENT ID: Sarah MouldsLoretta Husser GENDER: female DOB: Aug 18, 1924, MRN: [id number]   HPI  Chief Complaint  Patient presents with   Consult    Referred by PCP for history of asthma. Can not remember when she was first diagnosed with asthma. States the heat and humidity have caused her to have multiple asthma flare ups.     Sarah MouldsLoretta Hemrick is a 87 year old woman, former smoker with hypertension, hyperlipidemia, complete heart block s/p pacemaker and paroxysmal atrial fibrillation who is referred to pulmonary clinic for asthma.   She had hospital admission 4/17 to 4/18 for asthma exacerbation. She was treated with doxycycline and prednisone taper. She is currently on breztri inhaler, monteklukast and duonebs. She has been doing well since her hospitalization and has not needed her albuterol inhaler as much since starting on breztri inhaler but she reports she has frequent wheezing.  She quit smoking 50 years ago and was more of a social smoker at the time.   She does have trouble with seasonal allergies, more so in the spring time which can bother her asthma as well.  Past Medical History:  Diagnosis Date   Acute cholecystitis 09/17/2019   Allergies    Asthma    Bilateral carotid artery disease (HCC)    Encounter for care of pacemaker 06/17/2020   Heart block AV complete (HCC) 06/17/2020   Hyperlipidemia    Hypertension    Pacemaker Dual chamber [employer]onic Advisa DR MRI SureScan 01/29/2014 06/17/2020   Paroxysmal A-fib (HCC)      Family History  Problem Relation Age of Onset   Asthma Brother    Allergies Other    Asthma Other      Social History   Socioeconomic History   Marital status: Single    Spouse name: Not on file   Number of children: Not on file   Years of education: Not on file   Highest education level: Not on file  Occupational History   Not on file  Tobacco Use   Smoking status: Former     Packs/day: 1.00    Years: 5.00    Pack years: 5.00    Types: Cigarettes   Smokeless tobacco: Never   Tobacco comments:    pt didnt inhale, 1 pack per week   Vaping Use   Vaping Use: Never used  Substance and Sexual Activity   Alcohol use: Not Currently    Comment: occ   Drug use: Not on file   Sexual activity: Not on file  Other Topics Concern   Not on file  Social History Narrative   Not on file   Social Determinants of Health   Financial Resource Strain: Not on file  Food Insecurity: Not on file  Transportation Needs: Not on file  Physical Activity: Not on file  Stress: Not on file  Social Connections: Not on file  Intimate Partner Violence: Not on file     Allergies  Allergen Reactions   Lactose Intolerance (Gi) Nausea And Vomiting and Other (See Comments)    Stomach pain   Penicillins Other (See Comments)    Unknown reaction      Outpatient Medications Prior to Visit  Medication Sig Dispense Refill   acetaminophen (TYLENOL) 500 MG tablet Take 500 mg by mouth daily as needed for headache (pain).     amLODipine (NORVASC) 10 MG tablet Take 10 mg by mouth daily.  apixaban (ELIQUIS) 5 MG TABS tablet Take 1 tablet (5 mg total) by mouth 2 (two) times daily. 180 tablet 0   ASPIRIN LOW DOSE 81 MG EC tablet TAKE 1 TABLET(81 MG) BY MOUTH DAILY. SWALLOW WHOLE 30 tablet 11   atorvastatin (LIPITOR) 80 MG tablet Take 80 mg by mouth daily.     Calcium Carb-Cholecalciferol (CALCIUM 600-D PO) Take 1 tablet by mouth 2 (two) times daily.     guaiFENesin (MUCINEX) 600 MG 12 hr tablet Take 1 tablet (600 mg total) by mouth 2 (two) times daily as needed. 14 tablet 0   hydrochlorothiazide (HYDRODIURIL) 12.5 MG tablet Take 12.5 mg by mouth every morning.     ipratropium-albuterol (DUONEB) 0.5-2.5 (3) MG/3ML SOLN Take 3 mLs by nebulization every 6 (six) hours as needed. 360 mL 1   Lactase (LACTAID PO) Take 1 tablet by mouth as needed (when consuming dairy).     losartan (COZAAR) 50 MG  tablet Take 50 mg by mouth daily.     metoprolol succinate (TOPROL XL) 25 MG 24 hr tablet Take 1 tablet (25 mg total) by mouth every morning. Hold if systolic blood pressure (top number) less than 100 mmHg or pulse less than 60 bpm. 90 tablet 0   montelukast (SINGULAIR) 10 MG tablet Take 10 mg by mouth at bedtime.     OVER THE COUNTER MEDICATION Take 1 tablet by mouth daily. Super beets     BREZTRI AEROSPHERE 160-9-4.8 MCG/ACT AERO SMARTSIG:2 Puff(s) By Mouth Twice Daily     No facility-administered medications prior to visit.    Review of Systems  Constitutional:  Negative for chills, fever, malaise/fatigue and weight loss.  HENT:  Negative for congestion, sinus pain and sore throat.   Eyes: Negative.   Respiratory:  Positive for shortness of breath and wheezing. Negative for cough, hemoptysis and sputum production.   Cardiovascular:  Negative for chest pain, palpitations, orthopnea, claudication and leg swelling.  Gastrointestinal:  Negative for abdominal pain, heartburn, nausea and vomiting.  Genitourinary: Negative.   Musculoskeletal:  Negative for joint pain and myalgias.  Skin:  Negative for rash.  Neurological:  Negative for weakness.  Endo/Heme/Allergies: Negative.   Psychiatric/Behavioral: Negative.       Objective:   Vitals:   11/07/20 1035  BP: 128/62  Pulse: 80  SpO2: 97%  Weight: 135 lb 3.2 oz (61.3 kg)  Height: 4\' 3"  (1.295 m)     Physical Exam Constitutional:      General: She is not in acute distress.    Appearance: Normal appearance. She is not ill-appearing.  HENT:     Head: Normocephalic and atraumatic.  Eyes:     General: No scleral icterus.    Conjunctiva/sclera: Conjunctivae normal.     Pupils: Pupils are equal, round, and reactive to light.  Cardiovascular:     Rate and Rhythm: Normal rate and regular rhythm.     Pulses: Normal pulses.     Heart sounds: Normal heart sounds. No murmur heard. Pulmonary:     Effort: Pulmonary effort is normal.      Breath sounds: Wheezing (left mid lung field) present. No rhonchi or rales.  Abdominal:     General: Bowel sounds are normal.     Palpations: Abdomen is soft.  Musculoskeletal:     Right lower leg: No edema.     Left lower leg: No edema.  Lymphadenopathy:     Cervical: No cervical adenopathy.  Skin:    General: Skin is warm and dry.  Neurological:     General: No focal deficit present.     Mental Status: She is alert.  Psychiatric:        Mood and Affect: Mood normal.        Behavior: Behavior normal.        Thought Content: Thought content normal.        Judgment: Judgment normal.   CBC    Component Value Date/Time   WBC 12.7 (H) 07/29/2020 0111   RBC 2.94 (L) 07/29/2020 0111   HGB 11.6 09/30/2020 1022   HCT 34.2 09/30/2020 1022   PLT 212 07/29/2020 0111   MCV 93.5 07/29/2020 0111   MCH 31.0 07/29/2020 0111   MCHC 33.1 07/29/2020 0111   RDW 13.2 07/29/2020 0111   LYMPHSABS 1.1 07/29/2020 0111   MONOABS 0.3 07/29/2020 0111   EOSABS 0.0 07/29/2020 0111   BASOSABS 0.0 07/29/2020 0111   Chest imaging: CXR 07/27/20 Cardiac shadow is stable. Pacing device is again seen. Aortic calcifications are noted. No bony abnormality is noted. Lungs are clear bilaterally.  PFT: No flowsheet data found.  Echo 07/28/20: LVEF 65-70%. Mild concentric LVH. RV systolic function is normal. RV size is normal. LA moderately dilated. RA mild to moderately dilated.     Assessment & Plan:   Moderate persistent asthma with acute exacerbation - Plan: predniSONE (DELTASONE) 10 MG tablet  Discussion: Sarah Burns is a 87 year old woman, former smoker with hypertension, hyperlipidemia, complete heart block s/p pacemaker and paroxysmal atrial fibrillation who is referred to pulmonary clinic for asthma.   She has moderate persistent asthma with acute exacerbation given her on going wheezing on exam. We will provide her with a steroid taper as outlined in her patient instructions.   She is to  continue breztri inhaler therapy along with as needed albuterol. She is to also continue montelukast daily.   Follow up in 4 months.  Melody Comas, MD [facility] Office: [phone]   Current Outpatient Medications:    acetaminophen (TYLENOL) 500 MG tablet, Take 500 mg by mouth daily as needed for headache (pain)., Disp: , Rfl:    amLODipine (NORVASC) 10 MG tablet, Take 10 mg by mouth daily., Disp: , Rfl:    apixaban (ELIQUIS) 5 MG TABS tablet, Take 1 tablet (5 mg total) by mouth 2 (two) times daily., Disp: 180 tablet, Rfl: 0   ASPIRIN LOW DOSE 81 MG EC tablet, TAKE 1 TABLET(81 MG) BY MOUTH DAILY. SWALLOW WHOLE, Disp: 30 tablet, Rfl: 11   atorvastatin (LIPITOR) 80 MG tablet, Take 80 mg by mouth daily., Disp: , Rfl:    Calcium Carb-Cholecalciferol (CALCIUM 600-D PO), Take 1 tablet by mouth 2 (two) times daily., Disp: , Rfl:    guaiFENesin (MUCINEX) 600 MG 12 hr tablet, Take 1 tablet (600 mg total) by mouth 2 (two) times daily as needed., Disp: 14 tablet, Rfl: 0   hydrochlorothiazide (HYDRODIURIL) 12.5 MG tablet, Take 12.5 mg by mouth every morning., Disp: , Rfl:    ipratropium-albuterol (DUONEB) 0.5-2.5 (3) MG/3ML SOLN, Take 3 mLs by nebulization every 6 (six) hours as needed., Disp: 360 mL, Rfl: 1   Lactase (LACTAID PO), Take 1 tablet by mouth as needed (when consuming dairy)., Disp: , Rfl:    losartan (COZAAR) 50 MG tablet, Take 50 mg by mouth daily., Disp: , Rfl:    metoprolol succinate (TOPROL XL) 25 MG 24 hr tablet, Take 1 tablet (25 mg total) by mouth every morning. Hold if systolic blood pressure (  top number) less than 100 mmHg or pulse less than 60 bpm., Disp: 90 tablet, Rfl: 0   montelukast (SINGULAIR) 10 MG tablet, Take 10 mg by mouth at bedtime., Disp: , Rfl:    OVER THE COUNTER MEDICATION, Take 1 tablet by mouth daily. Super beets, Disp: , Rfl:    predniSONE (DELTASONE) 10 MG tablet, Take 4 tablets (40 mg total) by mouth daily with breakfast for 3 days,  THEN 3 tablets (30 mg total) daily with breakfast for 3 days, THEN 2 tablets (20 mg total) daily with breakfast for 3 days, THEN 1 tablet (10 mg total) daily with breakfast for 3 days., Disp: 30 tablet, Rfl: 0   BREZTRI AEROSPHERE 160-9-4.8 MCG/ACT AERO, SMARTSIG:2 Puff(s) By Mouth Twice Daily, Disp: 10.7 g, Rfl: 5

## 2020-11-12 ENCOUNTER — Encounter: Payer: Self-pay | Admitting: Pulmonary Disease

## 2020-11-15 DIAGNOSIS — E782 Mixed hyperlipidemia: Secondary | ICD-10-CM | POA: Diagnosis not present

## 2020-11-15 DIAGNOSIS — J449 Chronic obstructive pulmonary disease, unspecified: Secondary | ICD-10-CM | POA: Diagnosis not present

## 2020-11-15 DIAGNOSIS — I10 Essential (primary) hypertension: Secondary | ICD-10-CM | POA: Diagnosis not present

## 2020-11-15 DIAGNOSIS — J4520 Mild intermittent asthma, uncomplicated: Secondary | ICD-10-CM | POA: Diagnosis not present

## 2020-11-21 ENCOUNTER — Other Ambulatory Visit: Payer: Self-pay | Admitting: Cardiology

## 2020-11-21 DIAGNOSIS — I480 Paroxysmal atrial fibrillation: Secondary | ICD-10-CM

## 2020-12-19 DIAGNOSIS — E782 Mixed hyperlipidemia: Secondary | ICD-10-CM | POA: Diagnosis not present

## 2020-12-19 DIAGNOSIS — J449 Chronic obstructive pulmonary disease, unspecified: Secondary | ICD-10-CM | POA: Diagnosis not present

## 2020-12-19 DIAGNOSIS — I110 Hypertensive heart disease with heart failure: Secondary | ICD-10-CM | POA: Diagnosis not present

## 2020-12-19 DIAGNOSIS — M130 Polyarthritis, unspecified: Secondary | ICD-10-CM | POA: Diagnosis not present

## 2021-01-01 ENCOUNTER — Other Ambulatory Visit: Payer: Self-pay

## 2021-01-01 DIAGNOSIS — I480 Paroxysmal atrial fibrillation: Secondary | ICD-10-CM

## 2021-01-01 MED ORDER — APIXABAN 5 MG PO TABS
5.0000 mg | ORAL_TABLET | Freq: Two times a day (BID) | ORAL | 1 refills | Status: DC
Start: 2021-01-01 — End: 2021-07-03

## 2021-01-02 DIAGNOSIS — E782 Mixed hyperlipidemia: Secondary | ICD-10-CM | POA: Diagnosis not present

## 2021-01-02 DIAGNOSIS — J449 Chronic obstructive pulmonary disease, unspecified: Secondary | ICD-10-CM | POA: Diagnosis not present

## 2021-01-02 DIAGNOSIS — I10 Essential (primary) hypertension: Secondary | ICD-10-CM | POA: Diagnosis not present

## 2021-01-02 DIAGNOSIS — I110 Hypertensive heart disease with heart failure: Secondary | ICD-10-CM | POA: Diagnosis not present

## 2021-01-02 DIAGNOSIS — R069 Unspecified abnormalities of breathing: Secondary | ICD-10-CM | POA: Diagnosis not present

## 2021-01-06 DIAGNOSIS — Z950 Presence of cardiac pacemaker: Secondary | ICD-10-CM | POA: Diagnosis not present

## 2021-01-06 DIAGNOSIS — I442 Atrioventricular block, complete: Secondary | ICD-10-CM | POA: Diagnosis not present

## 2021-01-06 DIAGNOSIS — Z45018 Encounter for adjustment and management of other part of cardiac pacemaker: Secondary | ICD-10-CM | POA: Diagnosis not present

## 2021-01-08 DIAGNOSIS — Z45018 Encounter for adjustment and management of other part of cardiac pacemaker: Secondary | ICD-10-CM | POA: Diagnosis not present

## 2021-01-08 DIAGNOSIS — I442 Atrioventricular block, complete: Secondary | ICD-10-CM | POA: Diagnosis not present

## 2021-01-08 DIAGNOSIS — Z950 Presence of cardiac pacemaker: Secondary | ICD-10-CM | POA: Diagnosis not present

## 2021-01-23 DIAGNOSIS — Z0000 Encounter for general adult medical examination without abnormal findings: Secondary | ICD-10-CM | POA: Diagnosis not present

## 2021-01-23 DIAGNOSIS — J449 Chronic obstructive pulmonary disease, unspecified: Secondary | ICD-10-CM | POA: Diagnosis not present

## 2021-01-23 DIAGNOSIS — Z23 Encounter for immunization: Secondary | ICD-10-CM | POA: Diagnosis not present

## 2021-01-23 DIAGNOSIS — H6123 Impacted cerumen, bilateral: Secondary | ICD-10-CM | POA: Diagnosis not present

## 2021-01-23 DIAGNOSIS — E782 Mixed hyperlipidemia: Secondary | ICD-10-CM | POA: Diagnosis not present

## 2021-01-23 DIAGNOSIS — I110 Hypertensive heart disease with heart failure: Secondary | ICD-10-CM | POA: Diagnosis not present

## 2021-02-19 DIAGNOSIS — H6123 Impacted cerumen, bilateral: Secondary | ICD-10-CM | POA: Diagnosis not present

## 2021-04-03 DIAGNOSIS — I110 Hypertensive heart disease with heart failure: Secondary | ICD-10-CM | POA: Diagnosis not present

## 2021-04-03 DIAGNOSIS — M79671 Pain in right foot: Secondary | ICD-10-CM | POA: Diagnosis not present

## 2021-04-03 DIAGNOSIS — M79672 Pain in left foot: Secondary | ICD-10-CM | POA: Diagnosis not present

## 2021-04-03 DIAGNOSIS — E785 Hyperlipidemia, unspecified: Secondary | ICD-10-CM | POA: Diagnosis not present

## 2021-04-07 DIAGNOSIS — Z45018 Encounter for adjustment and management of other part of cardiac pacemaker: Secondary | ICD-10-CM | POA: Diagnosis not present

## 2021-04-07 DIAGNOSIS — I442 Atrioventricular block, complete: Secondary | ICD-10-CM | POA: Diagnosis not present

## 2021-04-09 DIAGNOSIS — I442 Atrioventricular block, complete: Secondary | ICD-10-CM | POA: Diagnosis not present

## 2021-04-09 DIAGNOSIS — Z45018 Encounter for adjustment and management of other part of cardiac pacemaker: Secondary | ICD-10-CM | POA: Diagnosis not present

## 2021-04-25 DIAGNOSIS — R6889 Other general symptoms and signs: Secondary | ICD-10-CM | POA: Diagnosis not present

## 2021-04-25 DIAGNOSIS — I10 Essential (primary) hypertension: Secondary | ICD-10-CM | POA: Diagnosis not present

## 2021-04-25 DIAGNOSIS — E782 Mixed hyperlipidemia: Secondary | ICD-10-CM | POA: Diagnosis not present

## 2021-04-25 DIAGNOSIS — J449 Chronic obstructive pulmonary disease, unspecified: Secondary | ICD-10-CM | POA: Diagnosis not present

## 2021-04-25 DIAGNOSIS — Z6825 Body mass index (BMI) 25.0-25.9, adult: Secondary | ICD-10-CM | POA: Diagnosis not present

## 2021-04-25 DIAGNOSIS — M130 Polyarthritis, unspecified: Secondary | ICD-10-CM | POA: Diagnosis not present

## 2021-04-25 DIAGNOSIS — I739 Peripheral vascular disease, unspecified: Secondary | ICD-10-CM | POA: Diagnosis not present

## 2021-05-02 ENCOUNTER — Encounter: Payer: Self-pay | Admitting: Cardiology

## 2021-05-02 ENCOUNTER — Ambulatory Visit: Payer: Medicare HMO | Admitting: Cardiology

## 2021-05-02 ENCOUNTER — Other Ambulatory Visit: Payer: Self-pay

## 2021-05-02 VITALS — BP 139/53 | HR 84 | Temp 98.0°F | Resp 16 | Ht <= 58 in | Wt 129.6 lb

## 2021-05-02 DIAGNOSIS — I442 Atrioventricular block, complete: Secondary | ICD-10-CM

## 2021-05-02 DIAGNOSIS — Z950 Presence of cardiac pacemaker: Secondary | ICD-10-CM | POA: Diagnosis not present

## 2021-05-02 DIAGNOSIS — Z7901 Long term (current) use of anticoagulants: Secondary | ICD-10-CM | POA: Diagnosis not present

## 2021-05-02 DIAGNOSIS — I10 Essential (primary) hypertension: Secondary | ICD-10-CM

## 2021-05-02 DIAGNOSIS — I700 Atherosclerosis of aorta: Secondary | ICD-10-CM

## 2021-05-02 DIAGNOSIS — I480 Paroxysmal atrial fibrillation: Secondary | ICD-10-CM

## 2021-05-02 DIAGNOSIS — E7800 Pure hypercholesterolemia, unspecified: Secondary | ICD-10-CM

## 2021-05-02 DIAGNOSIS — Z87891 Personal history of nicotine dependence: Secondary | ICD-10-CM

## 2021-05-02 DIAGNOSIS — R6889 Other general symptoms and signs: Secondary | ICD-10-CM | POA: Diagnosis not present

## 2021-05-02 NOTE — Progress Notes (Signed)
ID:  Sarah Burns, DOB [birth year], MRN [id number]  PCP:  Sarah Lei, MD  Cardiologist:  Sarah Kras, DO, [facility] (established care 03/11/2020) Former Cardiology Providers: Dr. Anders Burns at [facility]  Date: 05/02/21 Last Office Visit: 10/31/2020  Chief Complaint  Patient presents with   Atrial Fibrillation   Follow-up    HPI  Sarah Burns is a 88 y.o. female who presents to the office with a chief complaint of "10-monthfollow-up, pacemaker, atrial fibrillation" Patient's past medical history and cardiovascular risk factors include: Paroxysmal atrial fibrillation, presence of a permanent pacemaker, hypertension, hypercholesterolemia, former smoker, bilateral asymptomatic carotid artery disease, postmenopausal female, advanced age.  She is referred to the office at the request of BLucianne Lei MD for evaluation of heart disease.  Patient is accompanied by her daughter Sarah Mcjunkinat today's office visit.   She provides verbal consent in regards to discussing her medical history and plan of care in the presence of her daughter.  [place] her daughter and [facility] her pacemaker, Advisa DR MRI SureScan Pacemaker from [employer] implanted on January 29, 2014.   Most recent pacemaker interrogation report from December 2022 reviewed with the patient and her daughter at today's visit.  With regards to her paroxysmal atrial fibrillation she is doing well on medical therapy.  Remains on oral anticoagulation for thromboembolic prophylaxis.  She does not endorse any evidence of bleeding.  Patient states that her shortness of breath has improved since last office visit and does not have anginal discomfort.   ALLERGIES: Allergies  Allergen Reactions   Lactose Intolerance (Gi) Nausea And Vomiting and Other (See Comments)    Stomach pain   Penicillins Other (See  Comments)    Unknown reaction     MEDICATION LIST PRIOR TO VISIT: Current Meds  Medication Sig   acetaminophen (TYLENOL) 500 MG tablet Take 500 mg by mouth daily as needed for headache (pain).   amLODipine (NORVASC) 10 MG tablet Take 10 mg by mouth daily.   apixaban (ELIQUIS) 5 MG TABS tablet Take 1 tablet (5 mg total) by mouth 2 (two) times daily.   ASPIRIN LOW DOSE 81 MG EC tablet TAKE 1 TABLET(81 MG) BY MOUTH DAILY. SWALLOW WHOLE   atorvastatin (LIPITOR) 80 MG tablet Take 80 mg by mouth daily.   BREZTRI AEROSPHERE 160-9-4.8 MCG/ACT AERO SMARTSIG:2 Puff(s) By Mouth Twice Daily   Calcium Carb-Cholecalciferol (CALCIUM 600-D PO) Take 1 tablet by mouth 2 (two) times daily.   guaiFENesin (MUCINEX) 600 MG 12 hr tablet Take 1 tablet (600 mg total) by mouth 2 (two) times daily as needed.   hydrochlorothiazide (HYDRODIURIL) 12.5 MG tablet Take 12.5 mg by mouth every morning.   ipratropium-albuterol (DUONEB) 0.5-2.5 (3) MG/3ML SOLN Take 3 mLs by nebulization every 6 (six) hours as needed.   Lactase (LACTAID PO) Take 1 tablet by mouth as needed (when consuming dairy).   losartan (COZAAR) 50 MG tablet Take 50 mg by mouth daily.   metoprolol succinate (TOPROL XL) 25 MG 24 hr tablet Take 1 tablet (25 mg total) by mouth every morning. Hold if systolic blood pressure (top number) less than 100 mmHg or pulse less than 60 bpm.   montelukast (SINGULAIR) 10 MG tablet Take 10 mg by mouth at bedtime.   OVER THE COUNTER MEDICATION Take 1 tablet by mouth daily. Super beets     PAST MEDICAL HISTORY: Past Medical History:  Diagnosis Date   Acute cholecystitis 09/17/2019   Allergies    Asthma    Bilateral carotid artery disease ([place])    Encounter for care of pacemaker 06/17/2020   Heart block AV complete ([place]) 06/17/2020   Hyperlipidemia    Hypertension    Pacemaker Dual chamber [employer]onic Advisa DR MRI SureScan 01/29/2014 06/17/2020   Paroxysmal A-fib (HCC)     PAST SURGICAL HISTORY: Past Surgical  History:  Procedure Laterality Date   CHOLECYSTECTOMY N/A 09/19/2019   Procedure: LAPAROSCOPIC CHOLECYSTECTOMY;  Surgeon: Ralene Ok, MD;  Location: [facility];  Service: General;  Laterality: N/A;   GROIN DISSECTION     INSERT / REPLACE / REMOVE PACEMAKER     nasal polyp removal      FAMILY HISTORY: The patient family history includes Allergies in an other family member; Asthma in her brother and another family member. No family history of premature coronary disease or sudden cardiac death.  SOCIAL HISTORY:  The patient  reports that she has quit smoking. Her smoking use included cigarettes. She has a 5.00 pack-year smoking history. She has never used smokeless tobacco. She reports that she does not currently use alcohol. She reports that she does not use drugs.  REVIEW OF SYSTEMS: Review of Systems  Constitutional: Negative for chills and fever.  HENT:  Negative for hoarse voice and nosebleeds.   Eyes:  Negative for discharge, double vision and pain.  Cardiovascular:  Negative for chest pain, claudication, dyspnea on exertion, leg swelling, near-syncope, orthopnea, palpitations, paroxysmal nocturnal dyspnea and syncope.  Respiratory:  Positive for shortness of breath (Chronic and stable). Negative for hemoptysis and wheezing.   Musculoskeletal:  Negative for muscle cramps and myalgias.  Gastrointestinal:  Negative for abdominal pain, constipation, diarrhea, hematemesis, hematochezia, melena, nausea and vomiting.  Neurological:  Negative for dizziness and light-headedness.  PHYSICAL EXAM: Vitals with BMI 05/02/2021 11/07/2020 10/31/2020  Height '4\' 3"'  '4\' 3"'  '4\' 3"'   Weight 129 lbs 10 oz 135 lbs 3 oz 136 lbs 13 oz  BMI 33.00 76.22 37  Systolic 633 354 562  Diastolic 53 62 56  Pulse 84 80 86   CONSTITUTIONAL: Age-appropriate female, hemodynamically stable, well-developed and well-nourished. No acute distress.  SKIN: Skin is warm and dry. No rash noted. No cyanosis. No pallor. No  jaundice HEAD: Normocephalic and atraumatic.  EYES: No scleral icterus MOUTH/THROAT: Moist oral membranes.  NECK: No JVD present. No thyromegaly noted. Bilateral carotid bruits  LYMPHATIC: No visible cervical adenopathy.  CHEST Normal respiratory effort. No intercostal retractions.  Pacemaker located in the left infraclavicular region.  Site is clean dry and intact. LUNGS: Clear to auscultation bilaterally.  No stridor. No wheezes. No rales.  CARDIOVASCULAR: Positive S1-S2, regular, soft holosystolic murmur heard at the apex, no gallops or rubs. ABDOMINAL: Obese, soft, nontender, nondistended, positive bowel sounds all 4 quadrants no apparent ascites.  EXTREMITIES: No peripheral edema  HEMATOLOGIC: No significant bruising NEUROLOGIC: Oriented to person, place, and time. Nonfocal. Normal muscle tone.  PSYCHIATRIC: Normal mood and affect. Normal behavior. Cooperative  RADIOLOGY: Chest x-ray 03/22/2020: Left pacer in place as described above. No acute cardiopulmonary disease. Bibasilar scarring.  Aortic atherosclerosis.  CARDIAC DATABASE: Remote dual-chamber pacemaker transmission 04/07/2021: AP 14%, VP 100%.  Lead impedance and thresholds within normal limits.  Longevity 1 year and 11 months.  There was no mode switches, no high ventricular rate episodes.  EKG: 05/02/2021: Atrial sensed ventricular paced rhythm.  Echocardiogram: 07/28/2020: LVEF 65 to 70%, mild LVH, diastolic parameters indeterminate, moderately  dilated left atrium, moderately dilated right atrium, aortic valve sclerosis with mild stenosis.   Stress Testing: No results found for this or any previous visit from the past 1095 days.  Heart Catheterization: None  Carotid artery duplex 09/11/2020:  Stenosis in the right internal carotid artery (50-69%), upper limit of the spectrum.  Stenosis in the right external carotid artery (<50%).  Stenosis in the left internal carotid artery (50-69%). The left PSV  internal/common carotid artery ratio of 4.13 is consistent with a stenosis  of >70%.  Stenosis in the left external carotid artery (<50%).  Antegrade right vertebral artery flow. Antegrade left vertebral artery flow.  Follow up in six months is appropriate if clinically indicated.  CTA head and neck with and without contrast: 10/24/2020 1. No acute intracranial abnormality.  Normal for age. 2. Less than 50% diameter stenosis proximal right internal carotid artery and moderate to severe stenosis origin of right external carotid artery. Moderate stenosis right cavernous carotid due to calcific disease. 3. 50% diameter stenosis distal left common carotid artery and 60% diameter stenosis proximal left internal carotid artery. Moderate to severe stenosis origin of left external carotid artery. Moderate stenosis left cavernous carotid due to calcific disease. 4. Both vertebral arteries widely patent 5. No intracranial large vessel occlusion or flow limiting stenosis.  LABORATORY DATA: CBC Latest Ref Rng & Units 09/30/2020 07/29/2020 07/27/2020  WBC 4.0 - 10.5 K/uL - 12.7(H) 8.6  Hemoglobin 11.1 - 15.9 g/dL 11.6 9.1(L) 11.2(L)  Hematocrit 34.0 - 46.6 % 34.2 27.5(L) 35.0(L)  Platelets 150 - 400 K/uL - 212 270    CMP Latest Ref Rng & Units 09/30/2020 07/29/2020 07/28/2020  Glucose 65 - 99 mg/dL 67 151(H) 262(H)  BUN 10 - 36 mg/dL '16 17 16  ' Creatinine 0.57 - 1.00 mg/dL 1.01(H) 1.00 1.30(H)  Sodium 134 - 144 mmol/L 132(L) 130(L) 131(L)  Potassium 3.5 - 5.2 mmol/L 4.1 4.5 3.9  Chloride 96 - 106 mmol/L 96 99 99  CO2 20 - 29 mmol/L 22 22 18(L)  Calcium 8.7 - 10.3 mg/dL 9.5 9.9 9.4  Total Protein 6.5 - 8.1 g/dL - - -  Total Bilirubin 0.3 - 1.2 mg/dL - - -  Alkaline Phos 38 - 126 U/L - - -  AST 15 - 41 U/L - - -  ALT 0 - 44 U/L - - -    Lipid Panel  No results found for: CHOL, TRIG, HDL, CHOLHDL, VLDL, LDLCALC, LDLDIRECT, LABVLDL  No components found for: NTPROBNP No results for input(s): PROBNP in  the last 8760 hours. No results for input(s): TSH in the last 8760 hours.  BMP Recent Labs    07/27/20 2245 07/28/20 0933 07/29/20 0111 09/30/20 1022  NA 130* 131* 130* 132*  K 4.1 3.9 4.5 4.1  CL 100 99 99 96  CO2 22 18* 22 22  GLUCOSE 179* 262* 151* 67  BUN '15 16 17 16  ' CREATININE 1.27* 1.30* 1.00 1.01*  CALCIUM 9.3 9.4 9.9 9.5  GFRNONAA 39* 38* 53*  --     HEMOGLOBIN A1C Lab Results  Component Value Date   HGBA1C 5.6 07/29/2020   MPG 114 07/29/2020     External Labs: Collected: 05/13/2020 labs received from her PCPs office after the appointment. Creatinine 0.95 mg/dL. eGFR: 60 mL/min per 1.73 m AST 20, ALT 16, alkaline phosphatase 76 Hemoglobin 10.6 g/dL, hematocrit 32.4% Sodium 132, potassium 4.4, chloride 100, bicarb 26 Lipid profile: Total cholesterol 184, triglycerides 67, HDL 87, LDL 83, non-HDL 97  IMPRESSION:    ICD-10-CM   1. Paroxysmal atrial fibrillation (HCC)  I48.0 EKG 12-Lead    2. Long term (current) use of anticoagulants  Z79.01     3. Pacemaker Dual chamber [employer] Advisa DR MRI SureScan 01/29/2014  Z95.0     4. Heart block AV complete (HCC)  I44.2     5. Essential hypertension, benign  I10     6. Hypercholesteremia  E78.00     7. Former smoker  Z87.891     43. Atherosclerosis of aorta (HCC)  I70.0        RECOMMENDATIONS: Sarah Burns is a 88 y.o. female whose past medical history and cardiac risk factors include: Paroxysmal atrial fibrillation, presence of a permanent pacemaker, hypertension, hypercholesterolemia, former smoker, postmenopausal female, advanced age.  Paroxysmal atrial fibrillation (HCC) Initially discovered on her pacemaker transmissions. Rate control: Metoprolol. Rhythm control: N/A. Thromboembolic prophylaxis: Eliquis  Long term (current) use of anticoagulants Indication: Paroxysmal atrial fibrillation CHA2DS2-VASc SCORE is 5 which correlates to 6.7% risk of stroke per year (age greater than 75,  hypertension, aortic atherosclerosis/Carotid artery disease, gender). Patient does not endorse any evidence of bleeding.  Heart block AV complete [facility]), s/p Pacemaker Dual chamber Medtronic Advisa DR MRI SureScan 01/29/2014 Pacemaker implanted prior to establishing care with our practice due to second-degree and high degree AV block Last remote transmission from December 2022 reviewed with the patient and her daughter at this visit. Scheduled for in office check in March 2023  Essential hypertension, benign Office blood pressures are within acceptable range. Medications reconciled. No medication changes during today's encounter  Hypercholesteremia Continue statin therapy. Currently managed by primary care provider.  FINAL MEDICATION LIST END OF ENCOUNTER: No orders of the defined types were placed in this encounter.    There are no discontinued medications.    Current Outpatient Medications:    acetaminophen (TYLENOL) 500 MG tablet, Take 500 mg by mouth daily as needed for headache (pain)., Disp: , Rfl:    amLODipine (NORVASC) 10 MG tablet, Take 10 mg by mouth daily., Disp: , Rfl:    apixaban (ELIQUIS) 5 MG TABS tablet, Take 1 tablet (5 mg total) by mouth 2 (two) times daily., Disp: 180 tablet, Rfl: 1   ASPIRIN LOW DOSE 81 MG EC tablet, TAKE 1 TABLET(81 MG) BY MOUTH DAILY. SWALLOW WHOLE, Disp: 30 tablet, Rfl: 11   atorvastatin (LIPITOR) 80 MG tablet, Take 80 mg by mouth daily., Disp: , Rfl:    BREZTRI AEROSPHERE 160-9-4.8 MCG/ACT AERO, SMARTSIG:2 Puff(s) By Mouth Twice Daily, Disp: 10.7 g, Rfl: 5   Calcium Carb-Cholecalciferol (CALCIUM 600-D PO), Take 1 tablet by mouth 2 (two) times daily., Disp: , Rfl:    guaiFENesin (MUCINEX) 600 MG 12 hr tablet, Take 1 tablet (600 mg total) by mouth 2 (two) times daily as needed., Disp: 14 tablet, Rfl: 0   hydrochlorothiazide (HYDRODIURIL) 12.5 MG tablet, Take 12.5 mg by mouth every morning., Disp: , Rfl:    ipratropium-albuterol (DUONEB) 0.5-2.5  (3) MG/3ML SOLN, Take 3 mLs by nebulization every 6 (six) hours as needed., Disp: 360 mL, Rfl: 1   Lactase (LACTAID PO), Take 1 tablet by mouth as needed (when consuming dairy)., Disp: , Rfl:    losartan (COZAAR) 50 MG tablet, Take 50 mg by mouth daily., Disp: , Rfl:    metoprolol succinate (TOPROL XL) 25 MG 24 hr tablet, Take 1 tablet (25 mg total) by mouth every morning. Hold if systolic blood pressure (top number) less than 100 mmHg or pulse  less than 60 bpm., Disp: 90 tablet, Rfl: 0   montelukast (SINGULAIR) 10 MG tablet, Take 10 mg by mouth at bedtime., Disp: , Rfl:    OVER THE COUNTER MEDICATION, Take 1 tablet by mouth daily. Super beets, Disp: , Rfl:   Orders Placed This Encounter  Procedures   EKG 12-Lead    There are no Patient Instructions on file for this visit.   --Continue cardiac medications as reconciled in final medication list. --Return in about 6 months (around 10/30/2021) for Follow up, A. fib, pacemaker. Or sooner if needed. --Continue follow-up with your primary care physician regarding the management of your other chronic comorbid conditions.  Patient's questions and concerns were addressed to her satisfaction. She voices understanding of the instructions provided during this encounter.   This note was created using a voice recognition software as a result there may be grammatical errors inadvertently enclosed that do not reflect the nature of this encounter. Every attempt is made to correct such errors.  Sarah Burns, [place], [facility]  Pager: [phone] Office: [phone]

## 2021-07-03 ENCOUNTER — Telehealth: Payer: Self-pay | Admitting: Cardiology

## 2021-07-03 ENCOUNTER — Other Ambulatory Visit: Payer: Self-pay

## 2021-07-03 DIAGNOSIS — I480 Paroxysmal atrial fibrillation: Secondary | ICD-10-CM

## 2021-07-03 MED ORDER — APIXABAN 5 MG PO TABS: 5.0000 mg | ORAL_TABLET | Freq: Two times a day (BID) | ORAL | 1 refills | Status: DC

## 2021-07-03 NOTE — Telephone Encounter (Signed)
Pt needs a refill on meds  ? ?apixaban (ELIQUIS) 5 MG TABS tablet ? ?[id number] [place], [place] - [address] AT [facility]  [phone] ?

## 2021-07-03 NOTE — Telephone Encounter (Signed)
Med has been refilled.

## 2021-07-24 IMAGING — CT CT ANGIO NECK
2 of 11 series · 6 of 35 positions shown · IV contrast (iopamidol)
Comparison: None.

CLINICAL DATA: Carotid stenosis screening.

EXAM:
CT ANGIOGRAPHY HEAD AND NECK
TECHNIQUE: Multidetector CT imaging of the head and neck was performed using
the standard protocol during bolus administration of intravenous
contrast. Multiplanar CT image reconstructions and MIPs were
obtained to evaluate the vascular anatomy. Carotid stenosis
measurements (when applicable) are obtained utilizing NASCET
criteria, using the distal internal carotid diameter as the
denominator.
CONTRAST:  75mL ROIR6G-MLB IOPAMIDOL (ROIR6G-MLB) INJECTION 76%

[Series 12: brain 3.00 hr40 s3 sag without ibhc · sagittal · non-contrast · 0.28mm/px · 3 of 56 slices shown]
[im 8/56  soft-tissue]
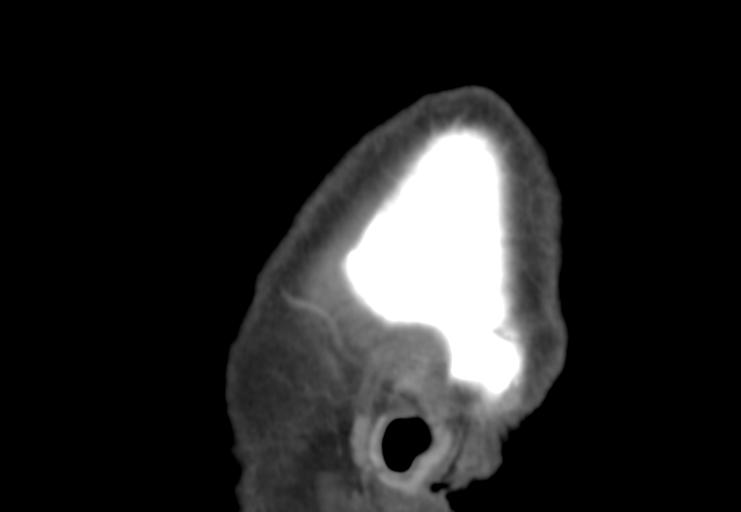
[im 28/56  soft-tissue]
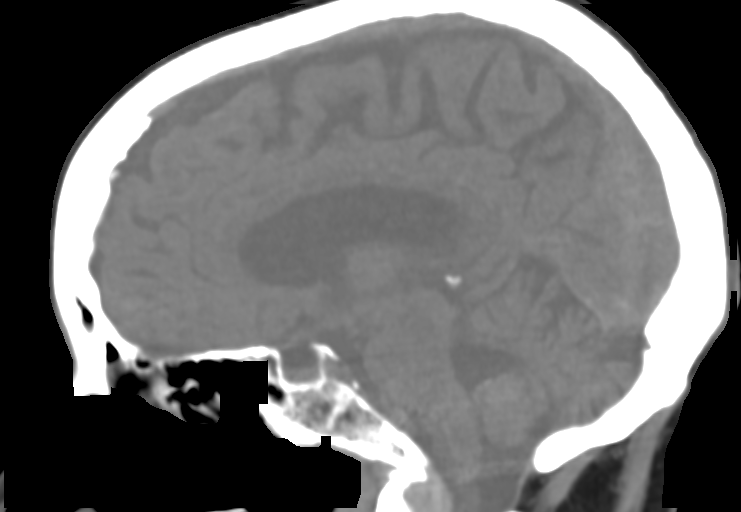
[im 49/56  soft-tissue]
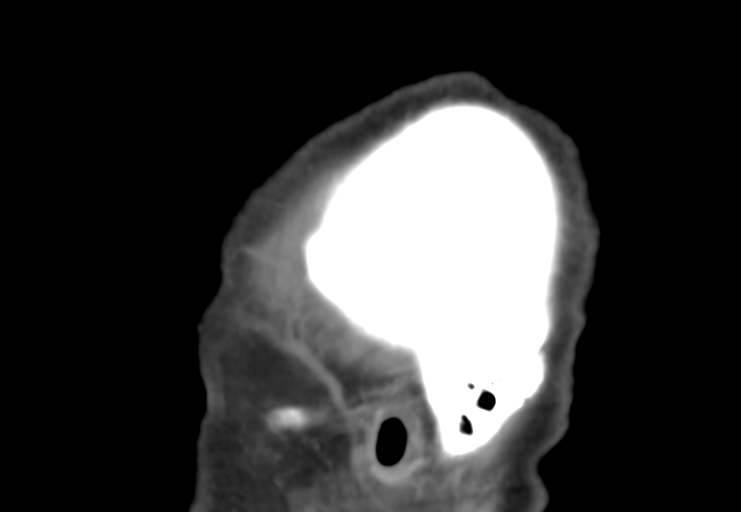

[Series 15: cta head & neck 1.00 hv48 s3 ax thin mips · axial · 0.44mm/px · z∈[-797,-456]mm · 3 of 342 slices shown]
[im 1/342  soft-tissue]
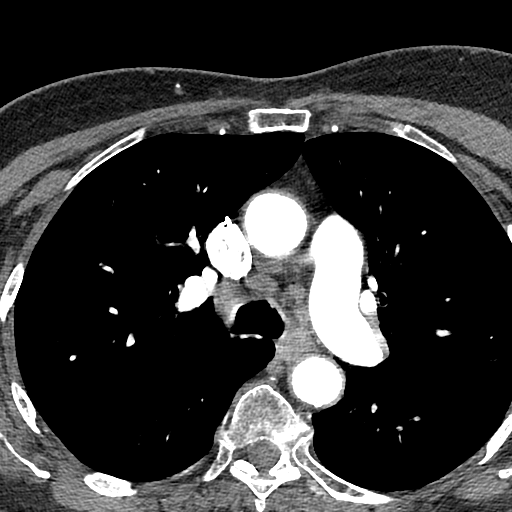
[im 171/342  bone]
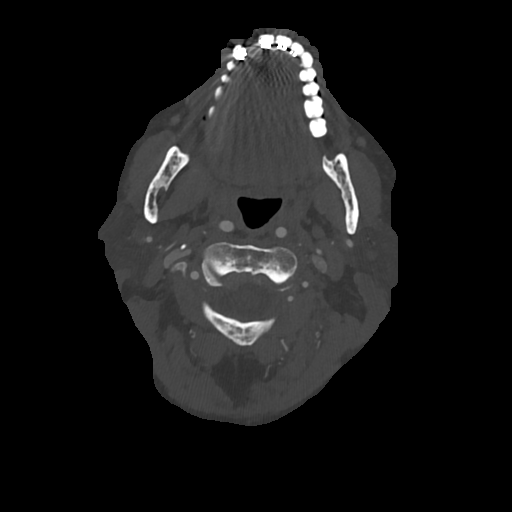
[im 342/342  soft-tissue]
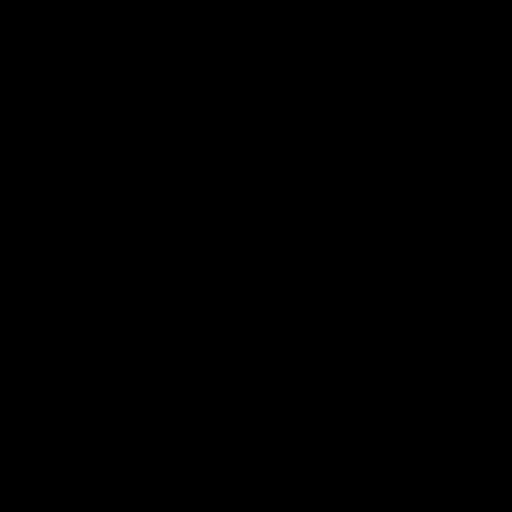

[6 of 35 positions shown; findings below may reference images not displayed]

FINDINGS: CT HEAD FINDINGS

Brain: Ventricle size and cerebral volume normal for age. No acute
infarct, hemorrhage, mass. Minimal if any white matter changes.

Vascular: Negative for hyperdense vessel

Skull: Negative

Sinuses: Minimal mucosal edema paranasal sinuses.

Orbits: Bilateral cataract surgery.

Review of the MIP images confirms the above findings

CTA NECK FINDINGS

Aortic arch: Atherosclerotic calcification aortic arch. Proximal
great vessels. No significant stenosis or aneurysm.

Right carotid system: Atherosclerotic calcification right carotid
bifurcation. Less than 50% diameter stenosis proximal right internal
carotid artery. Moderate to severe stenosis origin of right external
carotid artery

Left carotid system: Atherosclerotic calcification in the distal
common carotid artery with 50% diameter stenosis on the left.
Atherosclerotic calcification and 60% diameter stenosis proximal
left internal carotid artery. Moderate to severe stenosis origin of
left external carotid artery.

Vertebral arteries: Right vertebral artery dominant. Both vertebral
arteries widely patent to the skull base without stenosis.

Skeleton: Cervical spondylosis.  No acute skeletal abnormality.

Other neck: Negative for mass or adenopathy in the neck.

Upper chest: Lung apices clear bilaterally. Transvenous pacemaker
noted on the left.

Review of the MIP images confirms the above findings

CTA HEAD FINDINGS

Anterior circulation: Atherosclerotic calcification and moderate
stenosis in the cavernous carotid bilaterally. Anterior and middle
cerebral arteries patent without stenosis or large vessel occlusion

Posterior circulation: Both vertebral arteries patent to the basilar
without stenosis. PICA not visualized. AICA patent bilaterally.
Basilar widely patent. Superior cerebellar and posterior cerebral
arteries patent bilaterally. Fetal origin right posterior cerebral
artery. No large vessel occlusion or significant stenosis.

Venous sinuses: Normal venous enhancement

Anatomic variants: None

Review of the MIP images confirms the above findings
IMPRESSION: 1. No acute intracranial abnormality.  Normal for age.
2. Less than 50% diameter stenosis proximal right internal carotid
artery and moderate to severe stenosis origin of right external
carotid artery. Moderate stenosis right cavernous carotid due to
calcific disease.
3. 50% diameter stenosis distal left common carotid artery and 60%
diameter stenosis proximal left internal carotid artery. Moderate to
severe stenosis origin of left external carotid artery. Moderate
stenosis left cavernous carotid due to calcific disease.
4. Both vertebral arteries widely patent
5. No intracranial large vessel occlusion or flow limiting stenosis.

## 2021-08-11 DIAGNOSIS — H524 Presbyopia: Secondary | ICD-10-CM | POA: Diagnosis not present

## 2021-08-11 DIAGNOSIS — H52203 Unspecified astigmatism, bilateral: Secondary | ICD-10-CM | POA: Diagnosis not present

## 2021-08-11 DIAGNOSIS — Z961 Presence of intraocular lens: Secondary | ICD-10-CM | POA: Diagnosis not present

## 2021-08-11 DIAGNOSIS — H5202 Hypermetropia, left eye: Secondary | ICD-10-CM | POA: Diagnosis not present

## 2021-08-11 DIAGNOSIS — R6889 Other general symptoms and signs: Secondary | ICD-10-CM | POA: Diagnosis not present

## 2021-08-15 DIAGNOSIS — J449 Chronic obstructive pulmonary disease, unspecified: Secondary | ICD-10-CM | POA: Diagnosis not present

## 2021-08-15 DIAGNOSIS — I10 Essential (primary) hypertension: Secondary | ICD-10-CM | POA: Diagnosis not present

## 2021-08-22 DIAGNOSIS — R069 Unspecified abnormalities of breathing: Secondary | ICD-10-CM | POA: Diagnosis not present

## 2021-08-22 DIAGNOSIS — E785 Hyperlipidemia, unspecified: Secondary | ICD-10-CM | POA: Diagnosis not present

## 2021-08-22 DIAGNOSIS — I10 Essential (primary) hypertension: Secondary | ICD-10-CM | POA: Diagnosis not present

## 2021-08-22 DIAGNOSIS — M130 Polyarthritis, unspecified: Secondary | ICD-10-CM | POA: Diagnosis not present

## 2021-08-26 ENCOUNTER — Telehealth: Payer: Self-pay

## 2021-08-26 NOTE — Telephone Encounter (Signed)
Patient daughter called and stated that patient is due to transmit on 09/03/2021, but will be out of town. She will transmit when she gets back. Just FYI. ?

## 2021-09-25 DIAGNOSIS — M130 Polyarthritis, unspecified: Secondary | ICD-10-CM | POA: Diagnosis not present

## 2021-09-25 DIAGNOSIS — I10 Essential (primary) hypertension: Secondary | ICD-10-CM | POA: Diagnosis not present

## 2021-09-25 DIAGNOSIS — K429 Umbilical hernia without obstruction or gangrene: Secondary | ICD-10-CM | POA: Diagnosis not present

## 2021-09-25 DIAGNOSIS — R609 Edema, unspecified: Secondary | ICD-10-CM | POA: Diagnosis not present

## 2021-09-25 DIAGNOSIS — J449 Chronic obstructive pulmonary disease, unspecified: Secondary | ICD-10-CM | POA: Diagnosis not present

## 2021-09-25 DIAGNOSIS — R6889 Other general symptoms and signs: Secondary | ICD-10-CM | POA: Diagnosis not present

## 2021-10-06 DIAGNOSIS — Z45018 Encounter for adjustment and management of other part of cardiac pacemaker: Secondary | ICD-10-CM | POA: Diagnosis not present

## 2021-10-06 DIAGNOSIS — I442 Atrioventricular block, complete: Secondary | ICD-10-CM | POA: Diagnosis not present

## 2021-10-30 ENCOUNTER — Encounter: Payer: Self-pay | Admitting: Cardiology

## 2021-10-30 ENCOUNTER — Ambulatory Visit: Payer: Medicare HMO | Admitting: Cardiology

## 2021-10-30 ENCOUNTER — Telehealth: Payer: Self-pay | Admitting: Cardiology

## 2021-10-30 VITALS — BP 139/50 | HR 85 | Temp 97.7°F | Resp 16 | Ht <= 58 in | Wt 139.2 lb

## 2021-10-30 DIAGNOSIS — I6523 Occlusion and stenosis of bilateral carotid arteries: Secondary | ICD-10-CM | POA: Diagnosis not present

## 2021-10-30 DIAGNOSIS — Z45018 Encounter for adjustment and management of other part of cardiac pacemaker: Secondary | ICD-10-CM | POA: Diagnosis not present

## 2021-10-30 DIAGNOSIS — I700 Atherosclerosis of aorta: Secondary | ICD-10-CM | POA: Diagnosis not present

## 2021-10-30 DIAGNOSIS — I10 Essential (primary) hypertension: Secondary | ICD-10-CM

## 2021-10-30 DIAGNOSIS — Z7901 Long term (current) use of anticoagulants: Secondary | ICD-10-CM | POA: Diagnosis not present

## 2021-10-30 DIAGNOSIS — I442 Atrioventricular block, complete: Secondary | ICD-10-CM

## 2021-10-30 DIAGNOSIS — R6889 Other general symptoms and signs: Secondary | ICD-10-CM | POA: Diagnosis not present

## 2021-10-30 DIAGNOSIS — Z950 Presence of cardiac pacemaker: Secondary | ICD-10-CM

## 2021-10-30 DIAGNOSIS — I480 Paroxysmal atrial fibrillation: Secondary | ICD-10-CM

## 2021-10-30 DIAGNOSIS — E7800 Pure hypercholesterolemia, unspecified: Secondary | ICD-10-CM | POA: Diagnosis not present

## 2021-10-30 DIAGNOSIS — Z87891 Personal history of nicotine dependence: Secondary | ICD-10-CM

## 2021-10-30 MED ORDER — METOPROLOL SUCCINATE ER 25 MG PO TB24: 25.0000 mg | ORAL_TABLET | Freq: Every day | ORAL | 0 refills | Status: DC

## 2021-10-30 NOTE — Progress Notes (Signed)
ID:  Sarah Burns, DOB 1924/11/12, MRN [id number]  PCP:  Lucianne Lei, MD  Cardiologist:  Rex Kras, DO, [facility] (established care 03/11/2020) Former Cardiology Providers: Dr. Anders Simmonds at [facility]  Date: 10/30/21 Last Office Visit: 05/02/2021  Chief Complaint  Patient presents with   Atrial Fibrillation   Follow-up    HPI  Sarah Burns is a 88 y.o. female whose past medical history and cardiovascular risk factors include: Paroxysmal atrial fibrillation, presence of a permanent pacemaker, hypertension, hypercholesterolemia, former smoker, bilateral asymptomatic carotid artery disease, postmenopausal female, advanced age.  She is referred to the office at the request of Lucianne Lei, MD for evaluation of heart disease.  Patient is accompanied by her daughter Darnella Zeiter at today's office visit. She provides verbal consent in regards to discussing her medical history and plan of care in the presence of her daughter.  She is followed by the practice for her pacemaker management and paroxysmal atrial fibrillation.  She was supposed to have a pacemaker check since last office visit but this is still pending.  We will arrange that today possibly.  With regards to atrial fibrillation she is doing well on current medical therapy.  She does not endorse evidence of bleeding.   She denies anginal discomfort or heart failure symptoms.  But has audible wheezing.  She recently followed up with PCP and had antibiotics and steroids for her wheezing and also labs performed.  They have not followed up with pulmonary medicine yet.  ALLERGIES: Allergies  Allergen Reactions   Lactose Intolerance (Gi) Nausea And Vomiting and Other (See Comments)    Stomach pain   Penicillins Other (See Comments)    Unknown reaction     MEDICATION LIST PRIOR TO VISIT: Current Meds  Medication Sig   acetaminophen (TYLENOL) 500 MG tablet Take 500 mg by mouth daily as needed for headache (pain).    amLODipine (NORVASC) 10 MG tablet Take 10 mg by mouth daily.   apixaban (ELIQUIS) 5 MG TABS tablet Take 1 tablet (5 mg total) by mouth 2 (two) times daily.   ASPIRIN LOW DOSE 81 MG EC tablet TAKE 1 TABLET(81 MG) BY MOUTH DAILY. SWALLOW WHOLE   atorvastatin (LIPITOR) 80 MG tablet Take 80 mg by mouth daily.   BREZTRI AEROSPHERE 160-9-4.8 MCG/ACT AERO SMARTSIG:2 Puff(s) By Mouth Twice Daily   guaiFENesin (MUCINEX) 600 MG 12 hr tablet Take 1 tablet (600 mg total) by mouth 2 (two) times daily as needed.   hydrochlorothiazide (HYDRODIURIL) 12.5 MG tablet Take 12.5 mg by mouth every morning.   ipratropium-albuterol (DUONEB) 0.5-2.5 (3) MG/3ML SOLN Take 3 mLs by nebulization every 6 (six) hours as needed.   Lactase (LACTAID PO) Take 1 tablet by mouth as needed (when consuming dairy).   losartan (COZAAR) 50 MG tablet Take 50 mg by mouth daily.   metoprolol succinate (TOPROL XL) 25 MG 24 hr tablet Take 1 tablet (25 mg total) by mouth daily.   montelukast (SINGULAIR) 10 MG tablet Take 10 mg by mouth at bedtime.   OVER THE COUNTER MEDICATION Take 1 tablet by mouth daily. Super beets   [DISCONTINUED] metoprolol succinate (TOPROL XL) 25 MG 24 hr tablet Take 1 tablet (25 mg total) by mouth daily.     PAST MEDICAL HISTORY: Past Medical History:  Diagnosis Date   Acute cholecystitis 09/17/2019   Allergies    Asthma    Bilateral carotid artery disease ([place])    Encounter for care of pacemaker 06/17/2020   Heart block AV  complete ([place]) 06/17/2020   Hyperlipidemia    Hypertension    Pacemaker Dual chamber Medtronic Advisa DR MRI SureScan 01/29/2014 06/17/2020   Paroxysmal A-fib (HCC)     PAST SURGICAL HISTORY: Past Surgical History:  Procedure Laterality Date   CHOLECYSTECTOMY N/A 09/19/2019   Procedure: LAPAROSCOPIC CHOLECYSTECTOMY;  Surgeon: Ralene Ok, MD;  Location: [facility] OR;  Service: General;  Laterality: N/A;   GROIN DISSECTION     INSERT / REPLACE / REMOVE PACEMAKER     nasal polyp removal       FAMILY HISTORY: The patient family history includes Allergies in an other family member; Asthma in her brother and another family member. No family history of premature coronary disease or sudden cardiac death.  SOCIAL HISTORY:  The patient  reports that she has quit smoking. Her smoking use included cigarettes. She has a 5.00 pack-year smoking history. She has never used smokeless tobacco. She reports that she does not currently use alcohol. She reports that she does not use drugs.  REVIEW OF SYSTEMS: Review of Systems  Cardiovascular:  Negative for chest pain, claudication, dyspnea on exertion, irregular heartbeat, leg swelling, near-syncope, orthopnea, palpitations, paroxysmal nocturnal dyspnea and syncope.  Respiratory:  Positive for shortness of breath (chronic) and wheezing.   Hematologic/Lymphatic: Negative for bleeding problem.  Musculoskeletal:  Negative for muscle cramps and myalgias.  Neurological:  Negative for dizziness and light-headedness.   PHYSICAL EXAM:    10/30/2021   10:00 AM 05/02/2021   11:01 AM 11/07/2020   10:35 AM  Vitals with BMI  Height _0  _1  _2   Weight 139 lbs 3 oz 129 lbs 10 oz 135 lbs 3 oz  BMI 37.65 75.64 33.29  Systolic 518 841 660  Diastolic 50 53 62  Pulse 85 84 80   CONSTITUTIONAL: Frail, hemodynamically stable, No acute distress.  SKIN: Skin is warm and dry. No rash noted. No cyanosis. No pallor. No jaundice HEAD: Normocephalic and atraumatic.  EYES: No scleral icterus MOUTH/THROAT: Moist oral membranes.  NECK: No JVD present. No thyromegaly noted. Bilateral carotid bruits  LYMPHATIC: No visible cervical adenopathy.  CHEST Normal respiratory effort. No intercostal retractions.  Pacemaker located in the left infraclavicular region.  Site is clean dry and intact. LUNGS: Expiratory wheezes noted bilaterally, pursed breathing, no rales or rhonchi's. CARDIOVASCULAR: Positive S1-S2, regular, soft holosystolic murmur heard at the apex, no  gallops or rubs. ABDOMINAL: Obese, soft, nontender, nondistended, positive bowel sounds all 4 quadrants no apparent ascites.  EXTREMITIES: No peripheral edema, warm to touch. HEMATOLOGIC: No significant bruising NEUROLOGIC: Oriented to person, place, and time. Nonfocal. Normal muscle tone.  PSYCHIATRIC: Normal mood and affect. Normal behavior. Cooperative  RADIOLOGY: Chest x-ray 03/22/2020: Left pacer in place as described above. No acute cardiopulmonary disease. Bibasilar scarring.  Aortic atherosclerosis.  CARDIAC DATABASE: Remote dual-chamber pacemaker transmission 10/06/2021: AP 16%, VP 100%.  Longevity 1 year and 8 months.  Lead impedance and thresholds are normal.  There were no high ventricular rate episodes, high atrial rate episodes.  Normal pacemaker function.  Scheduled  In office pacemaker check 10/30/21  Single (S)/Dual (D)/BV: D. Presenting ASVP. Pacemaker dependant:  Yes. Underlying CHB. AP 17%, VP 100%. AMS Episodes 0.   HVR 5. Longest 2Sec.  Longevity 1 Years. Magnet rate: >85%. Lead measurements: Stable. Histogram: Low (L)/normal (N)/high (H)  Normal. Patient activity Low <1 hour per day.   Observations: Normal pacemaker function. Changes: None.  EKG: 05/02/2021: Atrial sensed ventricular paced rhythm.  Echocardiogram: 07/28/2020: LVEF 65  to 70%, mild LVH, diastolic parameters indeterminate, moderately dilated left atrium, moderately dilated right atrium, aortic valve sclerosis with mild stenosis.   Stress Testing: No results found for this or any previous visit from the past 1095 days.  Heart Catheterization: None  Carotid artery duplex 09/11/2020:  Stenosis in the right internal carotid artery (50-69%), upper limit of the spectrum.  Stenosis in the right external carotid artery (<50%).  Stenosis in the left internal carotid artery (50-69%). The left PSV internal/common carotid artery ratio of 4.13 is consistent with a stenosis  of >70%.  Stenosis in the  left external carotid artery (<50%).  Antegrade right vertebral artery flow. Antegrade left vertebral artery flow.  Follow up in six months is appropriate if clinically indicated.  CTA head and neck with and without contrast: 10/24/2020 1. No acute intracranial abnormality.  Normal for age. 2. Less than 50% diameter stenosis proximal right internal carotid artery and moderate to severe stenosis origin of right external carotid artery. Moderate stenosis right cavernous carotid due to calcific disease. 3. 50% diameter stenosis distal left common carotid artery and 60% diameter stenosis proximal left internal carotid artery. Moderate to severe stenosis origin of left external carotid artery. Moderate stenosis left cavernous carotid due to calcific disease. 4. Both vertebral arteries widely patent 5. No intracranial large vessel occlusion or flow limiting stenosis.  LABORATORY DATA:    Latest Ref Rng & Units 09/30/2020   10:22 AM 07/29/2020    1:11 AM 07/27/2020   10:45 PM  CBC  WBC 4.0 - 10.5 K/uL  12.7  8.6   Hemoglobin 11.1 - 15.9 g/dL 11.6  9.1  11.2   Hematocrit 34.0 - 46.6 % 34.2  27.5  35.0   Platelets 150 - 400 K/uL  212  270        Latest Ref Rng & Units 09/30/2020   10:22 AM 07/29/2020    1:11 AM 07/28/2020    9:33 AM  CMP  Glucose 65 - 99 mg/dL 67  151  262   BUN 10 - 36 mg/dL _0 Creatinine 0.57 - 1.00 mg/dL 1.01  1.00  1.30   Sodium 134 - 144 mmol/L 132  130  131   Potassium 3.5 - 5.2 mmol/L 4.1  4.5  3.9   Chloride 96 - 106 mmol/L 96  99  99   CO2 20 - 29 mmol/L _1 Calcium 8.7 - 10.3 mg/dL 9.5  9.9  9.4     Lipid Panel  No results found for: "CHOL", "TRIG", "HDL", "CHOLHDL", "VLDL", "LDLCALC", "LDLDIRECT", "LABVLDL"  No components found for: "NTPROBNP" No results for input(s): "PROBNP" in the last 8760 hours. No results for input(s): "TSH" in the last 8760 hours.  BMP No results for input(s): "NA", "K", "CL", "CO2", "GLUCOSE", "BUN",  "CREATININE", "CALCIUM", "GFRNONAA", "GFRAA" in the last 8760 hours.   HEMOGLOBIN A1C Lab Results  Component Value Date   HGBA1C 5.6 07/29/2020   MPG 114 07/29/2020     External Labs: Collected: 05/13/2020 labs received from her PCPs office after the appointment. Creatinine 0.95 mg/dL. eGFR: 60 mL/min per 1.73 m AST 20, ALT 16, alkaline phosphatase 76 Hemoglobin 10.6 g/dL, hematocrit 32.4% Sodium 132, potassium 4.4, chloride 100, bicarb 26 Lipid profile: Total cholesterol 184, triglycerides 67, HDL 87, LDL 83, non-HDL 97  Collected 04/25/2021 provided by PCP. Hemoglobin 10.1 g/dL, hematocrit 30.8%. Sodium 134, potassium 4.5, chloride 102, bicarb 23. Creatinine 1.09 milligrams  per deciliter, EGFR 68m/min/1.73 m AST 19, ALT 10, alkaline phosphatase 71 Total cholesterol 166, triglycerides 59, HDL 71, LDL 81  External Labs: Collected: 08/15/2021 provided by primary care Hemoglobin 10.5, hematocrit 32.4%  IMPRESSION:    ICD-10-CM   1. Pacemaker Dual chamber Medtronic Advisa DR MRI SureScan 01/29/2014  Z95.0     2. Heart block AV complete (HCC)  I44.2     3. Paroxysmal atrial fibrillation (HCC)  I48.0 metoprolol succinate (TOPROL XL) 25 MG 24 hr tablet    DISCONTINUED: metoprolol succinate (TOPROL XL) 25 MG 24 hr tablet    CANCELED: EKG 12-Lead    4. Long term (current) use of anticoagulants  Z79.01     5. Essential hypertension, benign  I10     6. Hypercholesteremia  E78.00     7. Atherosclerosis of aorta (HCC)  I70.0     8. Bilateral carotid artery stenosis  I65.23 PCV CAROTID DUPLEX (BILATERAL)    9. Former smoker  Z87.891        RECOMMENDATIONS: LJadin Kagelis a 88y.o. female whose past medical history and cardiac risk factors include: Paroxysmal atrial fibrillation, presence of a permanent pacemaker, hypertension, hypercholesterolemia, former smoker, postmenopausal female, advanced age.  Pacemaker Dual chamber Medtronic Advisa DR MRI SureScan  01/29/2014 Advisa DR MRI SureScan Pacemaker from Medtronic implanted on January 29, 2014 due to complete AV block Annual in office pacemaker interrogation performed today.  Results noted above.  Paroxysmal atrial fibrillation ([place] Discovered during her pacemaker transmissions. Rate control: Metoprolol. Rhythm control: N/A. Thromboembolic prophylaxis: Eliquis  Long term (current) use of anticoagulants Does not endorse evidence of bleeding. CHA2DS2-VASc SCORE is 5 which correlates to 6.7% risk of stroke per year (age greater than 75, hypertension, aortic atherosclerosis/Carotid artery disease, gender). Labs provided by PCP's office independently reviewed.  Hemoglobin remains relatively stable.  Essential hypertension, benign Office blood pressures are within acceptable range. Medications reconciled. No additional changes warranted at this time.  Hypercholesteremia Currently on statins. Lipids currently managed by PCP. Would recommend a goal LDL less than 70 mg/dL.  Atherosclerosis of aorta (HCC) / Bilateral carotid artery stenosis Currently on aspirin and statin therapy. We will repeat a carotid duplex prior to the next office visit to reevaluate disease progression. Patient denies any focal neurological deficits or vision changes since last visit.  At today's office visit patient appears to be more short of breath.  She has audible wheezing and pursed breathing.  Patient's daughter states that she was treated with antibiotics and steroids by PCP and continues to have dyspnea for the last several months.  This is definitely not her baseline and given her age and frailty this needs to be evaluated sooner than the next office visit.  I requested her to either follow-up with PCP today or go to ER for further evaluation and management.  She does have an established pulmonary medicine provider but no appointments in the near future according to the daughter.  Outside labs independently  reviewed and noted above for further reference.  FINAL MEDICATION LIST END OF ENCOUNTER: Meds ordered this encounter  Medications   DISCONTD: metoprolol succinate (TOPROL XL) 25 MG 24 hr tablet    Sig: Take 1 tablet (25 mg total) by mouth daily.    Dispense:  90 tablet    Refill:  0   metoprolol succinate (TOPROL XL) 25 MG 24 hr tablet    Sig: Take 1 tablet (25 mg total) by mouth daily.    Dispense:  90 tablet  Refill:  0     Medications Discontinued During This Encounter  Medication Reason   Calcium Carb-Cholecalciferol (CALCIUM 600-D PO)    metoprolol succinate (TOPROL XL) 25 MG 24 hr tablet    metoprolol succinate (TOPROL XL) 25 MG 24 hr tablet       Current Outpatient Medications:    acetaminophen (TYLENOL) 500 MG tablet, Take 500 mg by mouth daily as needed for headache (pain)., Disp: , Rfl:    amLODipine (NORVASC) 10 MG tablet, Take 10 mg by mouth daily., Disp: , Rfl:    apixaban (ELIQUIS) 5 MG TABS tablet, Take 1 tablet (5 mg total) by mouth 2 (two) times daily., Disp: 180 tablet, Rfl: 1   ASPIRIN LOW DOSE 81 MG EC tablet, TAKE 1 TABLET(81 MG) BY MOUTH DAILY. SWALLOW WHOLE, Disp: 30 tablet, Rfl: 11   atorvastatin (LIPITOR) 80 MG tablet, Take 80 mg by mouth daily., Disp: , Rfl:    BREZTRI AEROSPHERE 160-9-4.8 MCG/ACT AERO, SMARTSIG:2 Puff(s) By Mouth Twice Daily, Disp: 10.7 g, Rfl: 5   guaiFENesin (MUCINEX) 600 MG 12 hr tablet, Take 1 tablet (600 mg total) by mouth 2 (two) times daily as needed., Disp: 14 tablet, Rfl: 0   hydrochlorothiazide (HYDRODIURIL) 12.5 MG tablet, Take 12.5 mg by mouth every morning., Disp: , Rfl:    ipratropium-albuterol (DUONEB) 0.5-2.5 (3) MG/3ML SOLN, Take 3 mLs by nebulization every 6 (six) hours as needed., Disp: 360 mL, Rfl: 1   Lactase (LACTAID PO), Take 1 tablet by mouth as needed (when consuming dairy)., Disp: , Rfl:    losartan (COZAAR) 50 MG tablet, Take 50 mg by mouth daily., Disp: , Rfl:    metoprolol succinate (TOPROL XL) 25 MG 24 hr  tablet, Take 1 tablet (25 mg total) by mouth daily., Disp: 90 tablet, Rfl: 0   montelukast (SINGULAIR) 10 MG tablet, Take 10 mg by mouth at bedtime., Disp: , Rfl:    OVER THE COUNTER MEDICATION, Take 1 tablet by mouth daily. Super beets, Disp: , Rfl:   Orders Placed This Encounter  Procedures   PCV CAROTID DUPLEX (BILATERAL)    There are no Patient Instructions on file for this visit.   --Continue cardiac medications as reconciled in final medication list. --Return in about 6 months (around 05/02/2022) for Follow up, A. fib, Carotid disease. Or sooner if needed. --Continue follow-up with your primary care physician regarding the management of your other chronic comorbid conditions.  Patient's questions and concerns were addressed to her satisfaction. She voices understanding of the instructions provided during this encounter.   This note was created using a voice recognition software as a result there may be grammatical errors inadvertently enclosed that do not reflect the nature of this encounter. Every attempt is made to correct such errors.  Rex Kras, [place], [facility]  Pager: [phone] Office: [phone]

## 2021-10-30 NOTE — Progress Notes (Signed)
ICD-10-CM   1. Encounter for care of pacemaker  Z45.018     2. Pacemaker Dual chamber Medtronic Advisa DR MRI SureScan 01/29/2014  Z95.0     3. Heart block AV complete (HCC)  I44.2       Remote dual-chamber pacemaker transmission 10/06/2021: AP 16%, VP 100%.  Longevity 1 year and 8 months.  Lead impedance and thresholds are normal.  There were no high ventricular rate episodes, high atrial rate episodes.  Normal pacemaker function.  Scheduled  In office pacemaker check 10/30/21  Single (S)/Dual (D)/BV: D. Presenting ASVP. Pacemaker dependant:  Yes. Underlying CHB. AP 17%, VP 100%. AMS Episodes 0.   HVR 5. Longest 2Sec.  Longevity 1 Years. Magnet rate: >85%. Lead measurements: Stable. Histogram: Low (L)/normal (N)/high (H)  Normal. Patient activity Low <1 hour per day.   Observations: Normal pacemaker function. Changes: None.   Yates Decamp, MD, [facility] 10/30/2021, 11:31 AM Office: [phone] Fax: [phone] Pager: [phone]

## 2021-10-30 NOTE — Telephone Encounter (Signed)
Patient checked in today for her appointment. 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 Can you call her to verify what she's talking about? All we have is the [facility].

## 2021-11-03 DIAGNOSIS — R069 Unspecified abnormalities of breathing: Secondary | ICD-10-CM | POA: Diagnosis not present

## 2021-11-03 DIAGNOSIS — J449 Chronic obstructive pulmonary disease, unspecified: Secondary | ICD-10-CM | POA: Diagnosis not present

## 2021-11-03 DIAGNOSIS — J4520 Mild intermittent asthma, uncomplicated: Secondary | ICD-10-CM | POA: Diagnosis not present

## 2021-11-13 ENCOUNTER — Ambulatory Visit: Payer: Medicare HMO | Admitting: Podiatry

## 2021-11-13 DIAGNOSIS — J449 Chronic obstructive pulmonary disease, unspecified: Secondary | ICD-10-CM | POA: Diagnosis not present

## 2021-11-13 DIAGNOSIS — E785 Hyperlipidemia, unspecified: Secondary | ICD-10-CM | POA: Diagnosis not present

## 2021-11-13 DIAGNOSIS — M130 Polyarthritis, unspecified: Secondary | ICD-10-CM | POA: Diagnosis not present

## 2021-11-13 DIAGNOSIS — E782 Mixed hyperlipidemia: Secondary | ICD-10-CM | POA: Diagnosis not present

## 2021-11-13 DIAGNOSIS — I10 Essential (primary) hypertension: Secondary | ICD-10-CM | POA: Diagnosis not present

## 2021-11-13 DIAGNOSIS — I739 Peripheral vascular disease, unspecified: Secondary | ICD-10-CM | POA: Diagnosis not present

## 2021-11-21 ENCOUNTER — Ambulatory Visit: Payer: Medicare HMO | Admitting: Podiatry

## 2021-11-21 DIAGNOSIS — R6889 Other general symptoms and signs: Secondary | ICD-10-CM | POA: Diagnosis not present

## 2021-11-21 DIAGNOSIS — B351 Tinea unguium: Secondary | ICD-10-CM | POA: Diagnosis not present

## 2021-11-21 DIAGNOSIS — M79674 Pain in right toe(s): Secondary | ICD-10-CM | POA: Diagnosis not present

## 2021-11-21 DIAGNOSIS — Z7901 Long term (current) use of anticoagulants: Secondary | ICD-10-CM

## 2021-11-21 DIAGNOSIS — I739 Peripheral vascular disease, unspecified: Secondary | ICD-10-CM

## 2021-11-21 DIAGNOSIS — M79675 Pain in left toe(s): Secondary | ICD-10-CM

## 2021-11-21 MED ORDER — CICLOPIROX 8 % EX SOLN: Freq: Every day | CUTANEOUS | 2 refills | Status: DC

## 2021-11-21 NOTE — Patient Instructions (Signed)
Ciclopirox Topical Solution What is this medication? CICLOPIROX (sye kloe PEER ox) treats fungal infections of the nails. It belongs to a group of medications called antifungals. It will not treat infections caused by bacteria or viruses. This medicine may be used for other purposes; ask your health care provider or pharmacist if you have questions. COMMON BRAND NAME(S): Ciclodan Nail Solution, CNL8, [place] What should I tell my care team before I take this medication? They need to know if you have any of these conditions: Diabetes (high blood sugar) Immune system problems Organ transplant Receiving steroid inhalers, cream, or lotion Seizures Tingling of the fingers or toes or other nerve disorder An unusual or allergic reaction to ciclopirox, other medications, foods, dyes, or preservatives Pregnant or trying to get pregnant Breast-feeding How should I use this medication? This medication is for external use only. Do not take by mouth. Wash your hands before and after use. If you are treating your hands, only wash your hands before use. Do not get it in your eyes. If you do, rinse your eyes with plenty of cool tap water. Use it as directed on the prescription label at the same time every day. Do not use it more often than directed. Use the medication for the full course as directed by your care team, even if you think you are better. Do not stop using it unless your care team tells you to stop it early. Apply a thin film of the medication to the affected area. Talk to your care team about the use of this medication in children. While it may be prescribed for children as young as 12 years for selected conditions, precautions do apply. Overdosage: If you think you have taken too much of this medicine contact a poison control center or emergency room at once. NOTE: This medicine is only for you. Do not share this medicine with others. What if I miss a dose? If you miss a dose, use it as soon as  you can. If it is almost time for your next dose, use only that dose. Do not use double or extra doses. What may interact with this medication? Interactions are not expected. Do not use any other skin products without telling your care team. This list may not describe all possible interactions. Give your health care provider a list of all the medicines, herbs, non-prescription drugs, or dietary supplements you use. Also tell them if you smoke, drink alcohol, or use illegal drugs. Some items may interact with your medicine. What should I watch for while using this medication? Visit your care team for regular checks on your progress. It may be some time before you see the benefit from this medication. Do not use nail polish or other nail cosmetic products on the treated nails. Removal of the unattached, infected nail by your care team is needed with use of this medication. If you have diabetes or numbness in your fingers or toes, talk to your care team about proper nail care. What side effects may I notice from receiving this medication? Side effects that you should report to your care team as soon as possible: Allergic reactions--skin rash, itching, hives, swelling of the face, lips, tongue, or throat Burning, itching, crusting, or peeling of treated skin Side effects that usually do not require medical attention (report to your care team if they continue or are bothersome): Change in nail shape, thickness, or color Mild skin irritation, redness, or dryness This list may not describe all possible side   effects. Call your doctor for medical advice about side effects. You may report side effects to FDA at 1-800-FDA-1088. Where should I keep my medication? Keep out of the reach of children and pets. Store at room temperature between 20 and 25 degrees C (68 and 77 degrees F). This medication is flammable. Avoid exposure to heat, fire, flame, and smoking. Get rid of medications that are no longer needed  or have expired: Take the medication to a medication take-back program. Check with your pharmacy or law enforcement to find a location. If you cannot return the medication, check the label or package insert to see if the medication should be thrown out in the garbage or flushed down the toilet. If you are not sure, ask your care team. If it is safe to put in the trash, take the medication out of the container. Mix the medication with cat litter, dirt, coffee grounds, or other unwanted substance. Seal the mixture in a bag or container. Put it in the trash. NOTE: This sheet is a summary. It may not cover all possible information. If you have questions about this medicine, talk to your doctor, pharmacist, or health care provider.  2023 Elsevier/Gold Standard (2021-03-11 00:00:00)  

## 2021-11-21 NOTE — Progress Notes (Unsigned)
Subjective:   Patient ID: Sarah Burns, female   DOB: 88 y.o.   MRN: [id number]   HPI Chief Complaint  Patient presents with   Foot Problem     bil toenail fungus/ thick nails/ discolored, Nail trim, xfew months,    88 year old female presents with above complaints.  Nails are thickened discolored and she has fungus on the toenails.  She has difficulty trimming her toenails.  No swelling or redness to the toenail sites.  She is on Eliquis   Review of Systems  All other systems reviewed and are negative.  Past Medical History:  Diagnosis Date   Acute cholecystitis 09/17/2019   Allergies    Asthma    Bilateral carotid artery disease (HCC)    Encounter for care of pacemaker 06/17/2020   Heart block AV complete (HCC) 06/17/2020   Hyperlipidemia    Hypertension    Pacemaker Dual chamber Medtronic Advisa DR MRI SureScan 01/29/2014 06/17/2020   Paroxysmal A-fib (HCC)     Past Surgical History:  Procedure Laterality Date   CHOLECYSTECTOMY N/A 09/19/2019   Procedure: LAPAROSCOPIC CHOLECYSTECTOMY;  Surgeon: Axel Filler, MD;  Location: [facility] OR;  Service: General;  Laterality: N/A;   GROIN DISSECTION     INSERT / REPLACE / REMOVE PACEMAKER     nasal polyp removal       Current Outpatient Medications:    acetaminophen (TYLENOL) 500 MG tablet, Take 500 mg by mouth daily as needed for headache (pain)., Disp: , Rfl:    amLODipine (NORVASC) 10 MG tablet, Take 10 mg by mouth daily., Disp: , Rfl:    apixaban (ELIQUIS) 5 MG TABS tablet, Take 1 tablet (5 mg total) by mouth 2 (two) times daily., Disp: 180 tablet, Rfl: 1   ASPIRIN LOW DOSE 81 MG EC tablet, TAKE 1 TABLET(81 MG) BY MOUTH DAILY. SWALLOW WHOLE, Disp: 30 tablet, Rfl: 11   atorvastatin (LIPITOR) 80 MG tablet, Take 80 mg by mouth daily., Disp: , Rfl:    BREZTRI AEROSPHERE 160-9-4.8 MCG/ACT AERO, SMARTSIG:2 Puff(s) By Mouth Twice Daily, Disp: 10.7 g, Rfl: 5   ciclopirox (PENLAC) 8 % solution, Apply topically at bedtime. Apply over  nail and surrounding skin. Apply daily over previous coat. After seven (7) days, may remove with alcohol and continue cycle., Disp: 6.6 mL, Rfl: 2   guaiFENesin (MUCINEX) 600 MG 12 hr tablet, Take 1 tablet (600 mg total) by mouth 2 (two) times daily as needed., Disp: 14 tablet, Rfl: 0   hydrochlorothiazide (HYDRODIURIL) 12.5 MG tablet, Take 12.5 mg by mouth every morning., Disp: , Rfl:    ipratropium-albuterol (DUONEB) 0.5-2.5 (3) MG/3ML SOLN, Take 3 mLs by nebulization every 6 (six) hours as needed., Disp: 360 mL, Rfl: 1   Lactase (LACTAID PO), Take 1 tablet by mouth as needed (when consuming dairy)., Disp: , Rfl:    losartan (COZAAR) 50 MG tablet, Take 50 mg by mouth daily., Disp: , Rfl:    metoprolol succinate (TOPROL XL) 25 MG 24 hr tablet, Take 1 tablet (25 mg total) by mouth daily., Disp: 90 tablet, Rfl: 0   montelukast (SINGULAIR) 10 MG tablet, Take 10 mg by mouth at bedtime., Disp: , Rfl:    OVER THE COUNTER MEDICATION, Take 1 tablet by mouth daily. Super beets, Disp: , Rfl:   Allergies  Allergen Reactions   Lactose Intolerance (Gi) Nausea And Vomiting and Other (See Comments)    Stomach pain   Penicillins Other (See Comments)    Unknown reaction  Objective:  Physical Exam  General: NAD-presents with daughter  Dermatological: Nails are hypertrophic, dystrophic, brittle, elongated, with yellow, brown discoloration 10. No surrounding redness or drainage. Tenderness nails 1-5 bilaterally. No open lesions or pre-ulcerative lesions are identified today.  Vascular: Dorsalis Pedis artery and Posterior Tibial artery pedal pulses are decreased bilateral. There is no pain with calf compression, swelling, warmth, erythema.   Neruologic: Grossly intact via light touch bilateral.   Musculoskeletal: No other areas of discomfort noted.     Assessment:   88 year old female with symptomatic onychosis, PAD     Plan:  -Treatment options discussed including all alternatives,  risks, and complications -Etiology of symptoms were discussed -Nails debrided 10 without complications or bleeding. -Penlac- patients daughter wanted to have treatment for the fungus. I do not feel that we should do oral medication. I offered Penlac to help but given the dystrophy to the nails I am not sure how helpful topical will be. At the end of the appointment and we discussed trimming of the nails regularly there were no further questions after I asked if there was anything else I could do. After I left the room is when they wanted to discuss other options to treat the fungus.  -Discussed decreased blood flow.  She has no claudication symptoms at this time.  I will monitor this and should she develop ulceration or any symptoms will order arterial studies.  However at this point I do not think that ordering a work up of this is going to change any treatment plan. -Daily foot inspection -Follow-up in 3 months or sooner if any problems arise. In the meantime, encouraged to call the office with any questions, concerns, change in symptoms.   Ovid Curd, DPM

## 2021-12-26 DIAGNOSIS — E782 Mixed hyperlipidemia: Secondary | ICD-10-CM | POA: Diagnosis not present

## 2021-12-26 DIAGNOSIS — R6889 Other general symptoms and signs: Secondary | ICD-10-CM | POA: Diagnosis not present

## 2021-12-26 DIAGNOSIS — Z6824 Body mass index (BMI) 24.0-24.9, adult: Secondary | ICD-10-CM | POA: Diagnosis not present

## 2021-12-26 DIAGNOSIS — M130 Polyarthritis, unspecified: Secondary | ICD-10-CM | POA: Diagnosis not present

## 2021-12-26 DIAGNOSIS — K5900 Constipation, unspecified: Secondary | ICD-10-CM | POA: Diagnosis not present

## 2021-12-26 DIAGNOSIS — J449 Chronic obstructive pulmonary disease, unspecified: Secondary | ICD-10-CM | POA: Diagnosis not present

## 2021-12-26 DIAGNOSIS — K429 Umbilical hernia without obstruction or gangrene: Secondary | ICD-10-CM | POA: Diagnosis not present

## 2021-12-26 DIAGNOSIS — I10 Essential (primary) hypertension: Secondary | ICD-10-CM | POA: Diagnosis not present

## 2021-12-30 ENCOUNTER — Other Ambulatory Visit: Payer: Self-pay | Admitting: Cardiology

## 2021-12-30 DIAGNOSIS — I480 Paroxysmal atrial fibrillation: Secondary | ICD-10-CM

## 2022-01-01 ENCOUNTER — Telehealth: Payer: Self-pay

## 2022-01-01 DIAGNOSIS — J441 Chronic obstructive pulmonary disease with (acute) exacerbation: Secondary | ICD-10-CM

## 2022-01-01 NOTE — Telephone Encounter (Signed)
   Telephone encounter was:  Successful.  01/01/2022 Name: [name] MRN: [id number] DOB: 1924-10-10  [name] is a 88 y.o. year old female who is a primary care patient of Lucianne Lei, MD . The community resource team was consulted for assistance with Transportation Needs   Care guide performed the following interventions: Spoke with patient's daughter Neoma Laming about [employer] transportation. This is the only non-medical transportation option in [place]. Verified mailing address to mail application. Jorene Minors my name and number to contact if she does not receive the information in the next 7-10 business days.  Follow Up Plan:  No further follow up planned at this time. The patient has been provided with needed resources.  [place] Resource Care Guide   [email or web address]  ?? [phone]   Website: [email or web address]  [email or web address]  "We don't say no, we SHOW how!"         [facility]

## 2022-01-01 NOTE — Patient Outreach (Signed)
  Care Coordination   Initial Visit Note   01/01/2022 Name: Sarah Burns MRN: [id number] DOB: Jul 26, 1924  Sarah Burns is a 88 y.o. year old female who sees Lucianne Lei, MD for primary care. RN CM spoke   with patient's daughter-Sarah Burns  What matters to the patients health and wellness today?  Daughter reports that patient doing well for her age. No acute issues. She states that patient still likes to get out of the house and go to stores, run errands but does not have transportation to do these things. Daughter interested in transportation resources. She uses [employer] transportation for MD appts and denies needing other medical transport resources.    Goals Addressed             This Visit's Progress    COMPLETED: Care Coordination-no follow up required       Care Coordination Interventions: Advised patient to schedule Annual Wellness Visit and immunizations appt Provided education to patient re: [place] referral for non-medical transportation resources Assessed social determinant of health barriers          SDOH assessments and interventions completed:  Yes  SDOH Interventions Today    Flowsheet Row Most Recent Value  SDOH Interventions   Food Insecurity Interventions Intervention Not Indicated  Transportation Interventions Other (Comment)  [referral to care guide for resources]        Care Coordination Interventions Activated:  Yes  Care Coordination Interventions:  Yes, provided   Follow up plan: Referral made to care guide for non medical transport resources    Encounter Outcome:  Pt. Visit Completed   Enzo Montgomery, RN,BSN,CCM [place] [facility] Telephonic Care Management Coordinator Direct Phone: [phone] Toll Free: [phone] Fax: [phone]

## 2022-01-02 ENCOUNTER — Telehealth: Payer: Self-pay

## 2022-01-02 NOTE — Patient Outreach (Signed)
  Care Coordination   Follow Up Visit Note   01/02/2022 Name: Sarah Burns MRN: [id number] DOB: 1924-05-17  Sarah Burns is a 88 y.o. year old female who sees Sarah Lei, MD for primary care. I  placed a return call to daughter-Sarah Burns after receiving a voicemail message from her.   What matters to the patients health and wellness today?  Daughter confirms that she has spoken with care guide bout transportation resources and appreciate prompt service/response. She is inquiring about assistance with incontinence supplies she forgot to mention during previous call. RN CM provided info. No further RN CM needs or concerns at this time.     Goals Addressed   None     SDOH assessments and interventions completed:  No     Care Coordination Interventions Activated:  Yes  Care Coordination Interventions:  Yes, provided   Follow up plan: No further intervention required.   Encounter Outcome:  Pt. Visit Completed    Sarah Montgomery, RN,BSN,CCM [place] [facility] Telephonic Care Management Coordinator Direct Phone: [phone] Toll Free: [phone] Fax: [phone]

## 2022-01-05 DIAGNOSIS — Z45018 Encounter for adjustment and management of other part of cardiac pacemaker: Secondary | ICD-10-CM | POA: Diagnosis not present

## 2022-01-05 DIAGNOSIS — I442 Atrioventricular block, complete: Secondary | ICD-10-CM | POA: Diagnosis not present

## 2022-02-10 DIAGNOSIS — R6889 Other general symptoms and signs: Secondary | ICD-10-CM | POA: Diagnosis not present

## 2022-02-13 DIAGNOSIS — Z0000 Encounter for general adult medical examination without abnormal findings: Secondary | ICD-10-CM | POA: Diagnosis not present

## 2022-02-13 DIAGNOSIS — R6889 Other general symptoms and signs: Secondary | ICD-10-CM | POA: Diagnosis not present

## 2022-02-13 DIAGNOSIS — E785 Hyperlipidemia, unspecified: Secondary | ICD-10-CM | POA: Diagnosis not present

## 2022-02-13 DIAGNOSIS — M130 Polyarthritis, unspecified: Secondary | ICD-10-CM | POA: Diagnosis not present

## 2022-02-13 DIAGNOSIS — J449 Chronic obstructive pulmonary disease, unspecified: Secondary | ICD-10-CM | POA: Diagnosis not present

## 2022-02-13 DIAGNOSIS — E782 Mixed hyperlipidemia: Secondary | ICD-10-CM | POA: Diagnosis not present

## 2022-02-27 ENCOUNTER — Ambulatory Visit: Payer: Medicare HMO | Admitting: Podiatry

## 2022-03-23 ENCOUNTER — Ambulatory Visit: Payer: Medicare HMO | Admitting: Podiatry

## 2022-03-23 DIAGNOSIS — R6889 Other general symptoms and signs: Secondary | ICD-10-CM | POA: Diagnosis not present

## 2022-04-01 ENCOUNTER — Ambulatory Visit: Payer: Medicare HMO | Admitting: Podiatry

## 2022-04-01 ENCOUNTER — Encounter: Payer: Self-pay | Admitting: Podiatry

## 2022-04-01 DIAGNOSIS — Z7901 Long term (current) use of anticoagulants: Secondary | ICD-10-CM

## 2022-04-01 DIAGNOSIS — I739 Peripheral vascular disease, unspecified: Secondary | ICD-10-CM

## 2022-04-01 DIAGNOSIS — M79674 Pain in right toe(s): Secondary | ICD-10-CM | POA: Diagnosis not present

## 2022-04-01 DIAGNOSIS — M79675 Pain in left toe(s): Secondary | ICD-10-CM | POA: Diagnosis not present

## 2022-04-01 DIAGNOSIS — R6889 Other general symptoms and signs: Secondary | ICD-10-CM | POA: Diagnosis not present

## 2022-04-01 NOTE — Progress Notes (Signed)
This patient returns to my office for at risk foot care.  This patient requires this care by a professional since this patient will be at risk due to having  PAD and coagulation defect.  This patient is unable to cut nails herself since the patient cannot reach her nails.These nails are painful walking and wearing shoes.  This patient presents for at risk foot care today.  General Appearance  Alert, conversant and in no acute stress.  Vascular  Dorsalis pedis and posterior tibial  pulses are weakly  palpable  bilaterally.  Capillary return is within normal limits  bilaterally. Temperature is within normal limits  bilaterally.  Neurologic  Senn-Weinstein monofilament wire test within normal limits  bilaterally. Muscle power within normal limits bilaterally.  Nails Thick disfigured discolored nails with subungual debris  from hallux to fifth toes bilaterally. No evidence of bacterial infection or drainage bilaterally.  Orthopedic  No limitations of motion  feet .  No crepitus or effusions noted.  No bony pathology or digital deformities noted.  Skin  normotropic skin with no porokeratosis noted bilaterally.  No signs of infections or ulcers noted.     Onychomycosis  Pain in right toes  Pain in left toes  Consent was obtained for treatment procedures.   Mechanical debridement of nails 1-5  bilaterally performed with a nail nipper.  Filed with dremel without incident.    Return office visit   3 months                   Told patient to return for periodic foot care and evaluation due to potential at risk complications.   Helane Gunther DPM

## 2022-04-06 DIAGNOSIS — Z45018 Encounter for adjustment and management of other part of cardiac pacemaker: Secondary | ICD-10-CM | POA: Diagnosis not present

## 2022-04-06 DIAGNOSIS — I442 Atrioventricular block, complete: Secondary | ICD-10-CM | POA: Diagnosis not present

## 2022-04-22 ENCOUNTER — Ambulatory Visit: Payer: Medicare HMO

## 2022-04-22 DIAGNOSIS — I6523 Occlusion and stenosis of bilateral carotid arteries: Secondary | ICD-10-CM | POA: Diagnosis not present

## 2022-04-22 DIAGNOSIS — R6889 Other general symptoms and signs: Secondary | ICD-10-CM | POA: Diagnosis not present

## 2022-04-27 ENCOUNTER — Other Ambulatory Visit: Payer: Self-pay

## 2022-04-27 DIAGNOSIS — E7800 Pure hypercholesterolemia, unspecified: Secondary | ICD-10-CM

## 2022-04-27 NOTE — Progress Notes (Signed)
Patient and daughter acknowledged understanding and had no further questions. Labs ordered and released.

## 2022-05-04 ENCOUNTER — Other Ambulatory Visit: Payer: Self-pay | Admitting: Cardiology

## 2022-05-04 DIAGNOSIS — E7800 Pure hypercholesterolemia, unspecified: Secondary | ICD-10-CM | POA: Diagnosis not present

## 2022-05-05 ENCOUNTER — Encounter: Payer: Self-pay | Admitting: Cardiology

## 2022-05-05 ENCOUNTER — Ambulatory Visit: Payer: Medicare HMO | Admitting: Adult Health

## 2022-05-05 ENCOUNTER — Ambulatory Visit: Payer: Medicare HMO | Admitting: Cardiology

## 2022-05-05 VITALS — BP 151/70 | HR 79 | Ht <= 58 in | Wt 127.8 lb

## 2022-05-05 DIAGNOSIS — I700 Atherosclerosis of aorta: Secondary | ICD-10-CM | POA: Diagnosis not present

## 2022-05-05 DIAGNOSIS — I480 Paroxysmal atrial fibrillation: Secondary | ICD-10-CM

## 2022-05-05 DIAGNOSIS — Z7901 Long term (current) use of anticoagulants: Secondary | ICD-10-CM | POA: Diagnosis not present

## 2022-05-05 DIAGNOSIS — I6523 Occlusion and stenosis of bilateral carotid arteries: Secondary | ICD-10-CM | POA: Diagnosis not present

## 2022-05-05 DIAGNOSIS — I10 Essential (primary) hypertension: Secondary | ICD-10-CM

## 2022-05-05 DIAGNOSIS — Z950 Presence of cardiac pacemaker: Secondary | ICD-10-CM

## 2022-05-05 DIAGNOSIS — Z87891 Personal history of nicotine dependence: Secondary | ICD-10-CM | POA: Diagnosis not present

## 2022-05-05 DIAGNOSIS — R6889 Other general symptoms and signs: Secondary | ICD-10-CM | POA: Diagnosis not present

## 2022-05-05 LAB — COMPREHENSIVE METABOLIC PANEL
ALT: 14 IU/L (ref 0–32)
AST: 23 IU/L (ref 0–40)
Albumin/Globulin Ratio: 1.8 (ref 1.2–2.2)
Albumin: 4.2 g/dL (ref 3.6–4.6)
Alkaline Phosphatase: 83 IU/L (ref 44–121)
BUN/Creatinine Ratio: 13 (ref 12–28)
BUN: 14 mg/dL (ref 10–36)
Bilirubin Total: 0.3 mg/dL (ref 0.0–1.2)
CO2: 19 mmol/L — ABNORMAL LOW (ref 20–29)
Calcium: 9.6 mg/dL (ref 8.7–10.3)
Chloride: 100 mmol/L (ref 96–106)
Creatinine, Ser: 1.05 mg/dL — ABNORMAL HIGH (ref 0.57–1.00)
Globulin, Total: 2.4 g/dL (ref 1.5–4.5)
Glucose: 97 mg/dL (ref 70–99)
Potassium: 4.4 mmol/L (ref 3.5–5.2)
Sodium: 135 mmol/L (ref 134–144)
Total Protein: 6.6 g/dL (ref 6.0–8.5)
eGFR: 49 mL/min/{1.73_m2} — ABNORMAL LOW (ref >59)

## 2022-05-05 LAB — LDL CHOLESTEROL, DIRECT: LDL Direct: 74 mg/dL (ref 0–99)

## 2022-05-05 LAB — SPECIMEN STATUS REPORT

## 2022-05-05 LAB — LIPID PANEL WITH LDL/HDL RATIO
Cholesterol, Total: 166 mg/dL (ref 100–199)
HDL: 82 mg/dL (ref >39)
LDL Chol Calc (NIH): 71 mg/dL (ref 0–99)
LDL/HDL Ratio: 0.9 ratio (ref 0.0–3.2)
Triglycerides: 65 mg/dL (ref 0–149)
VLDL Cholesterol Cal: 13 mg/dL (ref 5–40)

## 2022-05-05 MED ORDER — APIXABAN 2.5 MG PO TABS: 2.5000 mg | ORAL_TABLET | Freq: Two times a day (BID) | ORAL | 0 refills | Status: DC

## 2022-05-05 NOTE — Progress Notes (Signed)
ID:  Sarah Burns, DOB June 01, 1924, MRN [id number]  PCP:  Lucianne Lei, MD  Cardiologist:  Rex Kras, DO, [facility] (established care 03/11/2020) Former Cardiology Providers: Dr. Anders Simmonds at [facility]  Date: 05/05/2022 Last Office Visit: 10/30/2021  Chief Complaint  Patient presents with   Paroxysmal atrial fibrillation   Hyperlipidemia   Follow-up    6 month    HPI  Sarah Burns is a 89 y.o. female whose past medical history and cardiovascular risk factors include: Paroxysmal atrial fibrillation, presence of a permanent pacemaker, hypertension, hypercholesterolemia, former smoker, bilateral asymptomatic carotid artery disease, postmenopausal female, advanced age.  She is referred to the office at the request of Lucianne Lei, MD for evaluation of heart disease.  Patient is accompanied by her daughter Sarah Burns at today's office visit. She provides verbal consent in regards to discussing her medical history and plan of care in the presence of her daughter. Patient is being followed in the practice given her paroxysmal atrial fibrillation, history of pacemaker implantation, and carotid artery disease.  She presents today for 36-month follow-up visit.  Since last office visit she denies anginal discomfort or heart failure symptoms.  She is not experiencing palpitations like she did in the past.  Her ventricular rate is well-controlled at home on current medical regimen.  He does not endorse any evidence of bleeding.  Given her underlying carotid disease she denies any vision changes or focal neurological deficits.  She had a recent carotid duplex results reviewed and correlates with her recent CTA head and neck.  Reviewed the labs from 05/04/2022 independently reviewed.  Renal function, electrolytes, LFTs, LDL are well-controlled.  ALLERGIES: Allergies  Allergen Reactions   Lactose Intolerance (Gi) Nausea And Vomiting and Other (See Comments)    Stomach pain   Penicillins Other  (See Comments)    Unknown reaction     MEDICATION LIST PRIOR TO VISIT: Current Meds  Medication Sig   acetaminophen (TYLENOL) 500 MG tablet Take 500 mg by mouth daily as needed for headache (pain).   amLODipine (NORVASC) 10 MG tablet Take 10 mg by mouth daily.   apixaban (ELIQUIS) 2.5 MG TABS tablet Take 1 tablet (2.5 mg total) by mouth 2 (two) times daily.   ASPIRIN LOW DOSE 81 MG EC tablet TAKE 1 TABLET(81 MG) BY MOUTH DAILY. SWALLOW WHOLE   atorvastatin (LIPITOR) 80 MG tablet Take 80 mg by mouth daily.   BREZTRI AEROSPHERE 160-9-4.8 MCG/ACT AERO SMARTSIG:2 Puff(s) By Mouth Twice Daily   ciclopirox (PENLAC) 8 % solution Apply topically at bedtime. Apply over nail and surrounding skin. Apply daily over previous coat. After seven (7) days, may remove with alcohol and continue cycle.   hydrochlorothiazide (HYDRODIURIL) 12.5 MG tablet Take 12.5 mg by mouth every morning.   ipratropium-albuterol (DUONEB) 0.5-2.5 (3) MG/3ML SOLN Take 3 mLs by nebulization every 6 (six) hours as needed.   Lactase (LACTAID PO) Take 1 tablet by mouth as needed (when consuming dairy).   losartan (COZAAR) 50 MG tablet Take 50 mg by mouth daily.   montelukast (SINGULAIR) 10 MG tablet Take 10 mg by mouth at bedtime.   OVER THE COUNTER MEDICATION Take 1 tablet by mouth daily. Super beets   [DISCONTINUED] ELIQUIS 5 MG TABS tablet TAKE 1 TABLET(5 MG) BY MOUTH TWICE DAILY     PAST MEDICAL HISTORY: Past Medical History:  Diagnosis Date   Acute cholecystitis 09/17/2019   Allergies    Asthma    Bilateral carotid artery disease ([place])  Encounter for care of pacemaker 06/17/2020   Heart block AV complete ([place]) 06/17/2020   Hyperlipidemia    Hypertension    Pacemaker Dual chamber Medtronic Advisa DR MRI SureScan 01/29/2014 06/17/2020   Paroxysmal A-fib (HCC)     PAST SURGICAL HISTORY: Past Surgical History:  Procedure Laterality Date   CHOLECYSTECTOMY N/A 09/19/2019   Procedure: LAPAROSCOPIC CHOLECYSTECTOMY;   Surgeon: Ralene Ok, MD;  Location: [facility] OR;  Service: General;  Laterality: N/A;   GROIN DISSECTION     INSERT / REPLACE / REMOVE PACEMAKER     nasal polyp removal      FAMILY HISTORY: The patient family history includes Allergies in an other family member; Asthma in her brother and another family member. No family history of premature coronary disease or sudden cardiac death.  SOCIAL HISTORY:  The patient  reports that she has quit smoking. Her smoking use included cigarettes. She has a 5.00 pack-year smoking history. She has never used smokeless tobacco. She reports that she does not currently use alcohol. She reports that she does not use drugs.  REVIEW OF SYSTEMS: Review of Systems  Cardiovascular:  Negative for chest pain, claudication, dyspnea on exertion, irregular heartbeat, leg swelling, near-syncope, orthopnea, palpitations, paroxysmal nocturnal dyspnea and syncope.  Respiratory:  Positive for shortness of breath (chronic) and wheezing.   Hematologic/Lymphatic: Negative for bleeding problem.  Musculoskeletal:  Negative for muscle cramps and myalgias.  Neurological:  Negative for dizziness and light-headedness.   PHYSICAL EXAM:    05/05/2022   11:40 AM 10/30/2021   10:00 AM 05/02/2021   11:01 AM  Vitals with BMI  Height 4\' 3"  4\' 3"  4\' 3"   Weight 127 lbs 13 oz 139 lbs 3 oz 129 lbs 10 oz  BMI 34.57 42.68 34.19  Systolic 622 297 989  Diastolic 70 50 53  Pulse 79 85 84   Physical Exam  Constitutional: No distress. She appears chronically ill.  Hemodynamically stable.   Neck: No JVD present.  Cardiovascular: Regular rhythm, S1 normal, S2 normal, intact distal pulses and normal pulses. Exam reveals no gallop, no S3 and no S4.  No murmur heard. Pulses:      Carotid pulses are  on the right side with bruit and  on the left side with bruit. Pulmonary/Chest: Effort normal and breath sounds normal. No stridor. She has no wheezes. She has no rales.  Pacemaker located left  infraclavicular region  Abdominal: Soft. Bowel sounds are normal. She exhibits no distension. There is no abdominal tenderness.  Musculoskeletal:        General: No edema.     Cervical back: Neck supple.  Neurological: She is alert and oriented to person, place, and time. She has intact cranial nerves (2-12).  Skin: Skin is warm and moist.    RADIOLOGY: Chest x-ray 03/22/2020: Left pacer in place as described above. No acute cardiopulmonary disease. Bibasilar scarring.  Aortic atherosclerosis.  CARDIAC DATABASE: Scheduled  In office pacemaker check 10/30/21  Single (S)/Dual (D)/BV: D. Presenting ASVP. Pacemaker dependant:  Yes. Underlying CHB. AP 17%, VP 100%. AMS Episodes 0.   HVR 5. Longest 2Sec.  Longevity 1 Years. Magnet rate: >85%. Lead measurements: Stable. Histogram: Low (L)/normal (N)/high (H)  Normal. Patient activity Low <1 hour per day.   Remote dual-chamber pacemaker transmission 04/06/2022: Longevity 12 months. AP 18%, VP 100%. Lead impedance and thresholds are normal.  There were no high ventricular rate episodes, no high atrial rate episodes.  Normal pacemaker function.  Observations: Normal pacemaker function.  Changes: None.  EKG: 05/02/2021: Atrial sensed ventricular paced rhythm.  05/05/2022: Atrial sensed ventricular paced rhythm, 70 bpm.  Echocardiogram: 07/28/2020: LVEF 65 to 70%, mild LVH, diastolic parameters indeterminate, moderately dilated left atrium, moderately dilated right atrium, aortic valve sclerosis with mild stenosis.   Stress Testing: No results found for this or any previous visit from the past 1095 days.  Heart Catheterization: None  Carotid artery duplex 04/22/2022: Duplex suggests stenosis in the right internal carotid artery (16-49%). Insignificant stenosis in the right external carotid artery. Duplex suggests stenosis in the left internal carotid artery (50-69%). Significant stenosis in the left external carotid artery  >50%. Antegrade right vertebral artery flow. Antegrade left vertebral artery flow. Compared to the study done on 09/11/2020, there is mild progression of the disease on the right from ICA stenosis of >50% and on the left also from ICA stenosis of >70%. Follow up in six months is appropriate if clinically indicated.   CTA head and neck with and without contrast: 10/24/2020 1. No acute intracranial abnormality.  Normal for age. 2. Less than 50% diameter stenosis proximal right internal carotid artery and moderate to severe stenosis origin of right external carotid artery. Moderate stenosis right cavernous carotid due to calcific disease. 3. 50% diameter stenosis distal left common carotid artery and 60% diameter stenosis proximal left internal carotid artery. Moderate to severe stenosis origin of left external carotid artery. Moderate stenosis left cavernous carotid due to calcific disease. 4. Both vertebral arteries widely patent 5. No intracranial large vessel occlusion or flow limiting stenosis.  LABORATORY DATA:    Latest Ref Rng & Units 09/30/2020   10:22 AM 07/29/2020    1:11 AM 07/27/2020   10:45 PM  CBC  WBC 4.0 - 10.5 K/uL  12.7  8.6   Hemoglobin 11.1 - 15.9 g/dL 11.6  9.1  11.2   Hematocrit 34.0 - 46.6 % 34.2  27.5  35.0   Platelets 150 - 400 K/uL  212  270        Latest Ref Rng & Units 05/04/2022    1:17 PM 09/30/2020   10:22 AM 07/29/2020    1:11 AM  CMP  Glucose 70 - 99 mg/dL 97  67  151   BUN 10 - 36 mg/dL 14  16  17    Creatinine 0.57 - 1.00 mg/dL 1.05  1.01  1.00   Sodium 134 - 144 mmol/L 135  132  130   Potassium 3.5 - 5.2 mmol/L 4.4  4.1  4.5   Chloride 96 - 106 mmol/L 100  96  99   CO2 20 - 29 mmol/L 19  22  22    Calcium 8.7 - 10.3 mg/dL 9.6  9.5  9.9   Total Protein 6.0 - 8.5 g/dL 6.6     Total Bilirubin 0.0 - 1.2 mg/dL 0.3     Alkaline Phos 44 - 121 IU/L 83     AST 0 - 40 IU/L 23     ALT 0 - 32 IU/L 14       Lipid Panel     Component Value Date/Time   CHOL  166 05/04/2022 1317   TRIG 65 05/04/2022 1317   HDL 82 05/04/2022 1317   LDLCALC 71 05/04/2022 1317   LDLDIRECT 74 05/04/2022 1317   LABVLDL 13 05/04/2022 1317    No components found for: "NTPROBNP" No results for input(s): "PROBNP" in the last 8760 hours. No results for input(s): "TSH" in the last 8760 hours.  BMP Recent  Labs    05/04/22 1317  NA 135  K 4.4  CL 100  CO2 19*  GLUCOSE 97  BUN 14  CREATININE 1.05*  CALCIUM 9.6     HEMOGLOBIN A1C Lab Results  Component Value Date   HGBA1C 5.6 07/29/2020   MPG 114 07/29/2020     External Labs: Collected: 05/13/2020 labs received from her PCPs office after the appointment. Creatinine 0.95 mg/dL. eGFR: 60 mL/min per 1.73 m AST 20, ALT 16, alkaline phosphatase 76 Hemoglobin 10.6 g/dL, hematocrit 16.1% Sodium 132, potassium 4.4, chloride 100, bicarb 26 Lipid profile: Total cholesterol 184, triglycerides 67, HDL 87, LDL 83, non-HDL 97  Collected 04/25/2021 provided by PCP. Hemoglobin 10.1 g/dL, hematocrit 09.6%. Sodium 134, potassium 4.5, chloride 102, bicarb 23. Creatinine 1.09 milligrams per deciliter, EGFR 56mL/min/1.73 m AST 19, ALT 10, alkaline phosphatase 71 Total cholesterol 166, triglycerides 59, HDL 71, LDL 81  External Labs: Collected: 08/15/2021 provided by primary care Hemoglobin 10.5, hematocrit 32.4%  IMPRESSION:    ICD-10-CM   1. Paroxysmal atrial fibrillation (HCC)  I48.0 EKG 12-Lead    PCV ECHOCARDIOGRAM COMPLETE    2. Long term (current) use of anticoagulants  Z79.01 apixaban (ELIQUIS) 2.5 MG TABS tablet    3. Pacemaker Dual chamber Medtronic Advisa DR MRI SureScan 01/29/2014  Z95.0     4. Essential hypertension, benign  I10 PCV ECHOCARDIOGRAM COMPLETE    5. Atherosclerosis of aorta (HCC)  I70.0     6. Bilateral carotid artery stenosis  I65.23     7. Former smoker  Z87.891        RECOMMENDATIONS: Sarah Burns is a 89 y.o. female whose past medical history and cardiac risk factors  include: Paroxysmal atrial fibrillation, presence of a permanent pacemaker, hypertension, hypercholesterolemia, former smoker, postmenopausal female, advanced age.  Paroxysmal atrial fibrillation (HCC) Rate control: Metoprolol. Rhythm control: N/A. Thromboembolic prophylaxis: Eliquis Diagnosed during pacemaker interrogation/transmissions. EKG today illustrates sinus rhythm.  Long term (current) use of anticoagulants Does not endorse evidence of bleeding. CHA2DS2-VASc SCORE is 5 which correlates to 7.2% risk of stroke per year (age, gender, aortic atherosclerosis, HTN).  Given her renal function, age, weight will reduce the dose of Eliquis to 2.5 mg p.o. twice daily. Risks, benefits, and alternatives to anticoagulation discussed with the patient and her daughter at today's office visit again.  Pacemaker Dual chamber Medtronic Advisa DR MRI SureScan 01/29/2014 Advisa DR MRI SureScan Pacemaker from [employer] implanted on January 29, 2014 due to complete AV block Most recent pacemaker transmission reviewed. Patient and daughter are aware of pacemaker longevity duration. Scheduled for an office pacemaker check in July 2024  Essential hypertension, benign Office blood pressures are not well-controlled. Home blood pressures are better controlled. Continue current medical therapy.   Bilateral carotid artery stenosis Remains asymptomatic. Most recent carotid duplex from January 2024 illustrates left ICA stenosis <70%, right ICA stenosis <50%. Similar findings noted on recent CT head and neck as outlined above. Continue current medical therapy Not on antiplatelets as she is already on anticoagulation for paroxysmal A-fib. Most recent LDL is 74 mg/dL.  FINAL MEDICATION LIST END OF ENCOUNTER: Meds ordered this encounter  Medications   apixaban (ELIQUIS) 2.5 MG TABS tablet    Sig: Take 1 tablet (2.5 mg total) by mouth 2 (two) times daily.    Dispense:  180 tablet    Refill:  0      Medications Discontinued During This Encounter  Medication Reason   ELIQUIS 5 MG TABS tablet Dose change  Current Outpatient Medications:    acetaminophen (TYLENOL) 500 MG tablet, Take 500 mg by mouth daily as needed for headache (pain)., Disp: , Rfl:    amLODipine (NORVASC) 10 MG tablet, Take 10 mg by mouth daily., Disp: , Rfl:    apixaban (ELIQUIS) 2.5 MG TABS tablet, Take 1 tablet (2.5 mg total) by mouth 2 (two) times daily., Disp: 180 tablet, Rfl: 0   ASPIRIN LOW DOSE 81 MG EC tablet, TAKE 1 TABLET(81 MG) BY MOUTH DAILY. SWALLOW WHOLE, Disp: 30 tablet, Rfl: 11   atorvastatin (LIPITOR) 80 MG tablet, Take 80 mg by mouth daily., Disp: , Rfl:    BREZTRI AEROSPHERE 160-9-4.8 MCG/ACT AERO, SMARTSIG:2 Puff(s) By Mouth Twice Daily, Disp: 10.7 g, Rfl: 5   ciclopirox (PENLAC) 8 % solution, Apply topically at bedtime. Apply over nail and surrounding skin. Apply daily over previous coat. After seven (7) days, may remove with alcohol and continue cycle., Disp: 6.6 mL, Rfl: 2   hydrochlorothiazide (HYDRODIURIL) 12.5 MG tablet, Take 12.5 mg by mouth every morning., Disp: , Rfl:    ipratropium-albuterol (DUONEB) 0.5-2.5 (3) MG/3ML SOLN, Take 3 mLs by nebulization every 6 (six) hours as needed., Disp: 360 mL, Rfl: 1   Lactase (LACTAID PO), Take 1 tablet by mouth as needed (when consuming dairy)., Disp: , Rfl:    losartan (COZAAR) 50 MG tablet, Take 50 mg by mouth daily., Disp: , Rfl:    montelukast (SINGULAIR) 10 MG tablet, Take 10 mg by mouth at bedtime., Disp: , Rfl:    OVER THE COUNTER MEDICATION, Take 1 tablet by mouth daily. Super beets, Disp: , Rfl:    guaiFENesin (MUCINEX) 600 MG 12 hr tablet, Take 1 tablet (600 mg total) by mouth 2 (two) times daily as needed. (Patient not taking: Reported on 05/05/2022), Disp: 14 tablet, Rfl: 0   metoprolol succinate (TOPROL XL) 25 MG 24 hr tablet, Take 1 tablet (25 mg total) by mouth daily. (Patient not taking: Reported on 05/05/2022), Disp: 90 tablet, Rfl:  0  Orders Placed This Encounter  Procedures   EKG 12-Lead   PCV ECHOCARDIOGRAM COMPLETE    There are no Patient Instructions on file for this visit.   --Continue cardiac medications as reconciled in final medication list. --Return in about 6 months (around 11/11/2022) for Follow up, Dyspnea, A. fib. Or sooner if needed. --Continue follow-up with your primary care physician regarding the management of your other chronic comorbid conditions.  Patient's questions and concerns were addressed to her satisfaction. She voices understanding of the instructions provided during this encounter.   This note was created using a voice recognition software as a result there may be grammatical errors inadvertently enclosed that do not reflect the nature of this encounter. Every attempt is made to correct such errors.  Rex Kras, [place], [facility]  Pager: [phone] Office: [phone]

## 2022-05-26 ENCOUNTER — Other Ambulatory Visit: Payer: Medicare HMO

## 2022-06-02 ENCOUNTER — Other Ambulatory Visit: Payer: Medicare HMO

## 2022-06-04 ENCOUNTER — Other Ambulatory Visit: Payer: Medicare HMO

## 2022-06-10 ENCOUNTER — Ambulatory Visit: Payer: Medicare HMO

## 2022-06-10 DIAGNOSIS — R6889 Other general symptoms and signs: Secondary | ICD-10-CM | POA: Diagnosis not present

## 2022-06-10 DIAGNOSIS — I10 Essential (primary) hypertension: Secondary | ICD-10-CM | POA: Diagnosis not present

## 2022-06-10 DIAGNOSIS — I480 Paroxysmal atrial fibrillation: Secondary | ICD-10-CM | POA: Diagnosis not present

## 2022-06-18 ENCOUNTER — Other Ambulatory Visit: Payer: Self-pay

## 2022-06-18 DIAGNOSIS — I480 Paroxysmal atrial fibrillation: Secondary | ICD-10-CM

## 2022-06-18 NOTE — Progress Notes (Signed)
Gave patients daughter this information, agreed to increase hydrochlorothiazide to 25 mg daily. Will have labs done in one week. Labs ordered and released.

## 2022-06-19 DIAGNOSIS — E785 Hyperlipidemia, unspecified: Secondary | ICD-10-CM | POA: Diagnosis not present

## 2022-06-19 DIAGNOSIS — R6889 Other general symptoms and signs: Secondary | ICD-10-CM | POA: Diagnosis not present

## 2022-06-19 DIAGNOSIS — E782 Mixed hyperlipidemia: Secondary | ICD-10-CM | POA: Diagnosis not present

## 2022-06-19 DIAGNOSIS — I739 Peripheral vascular disease, unspecified: Secondary | ICD-10-CM | POA: Diagnosis not present

## 2022-06-19 DIAGNOSIS — R609 Edema, unspecified: Secondary | ICD-10-CM | POA: Diagnosis not present

## 2022-06-19 DIAGNOSIS — I119 Hypertensive heart disease without heart failure: Secondary | ICD-10-CM | POA: Diagnosis not present

## 2022-06-19 DIAGNOSIS — J4520 Mild intermittent asthma, uncomplicated: Secondary | ICD-10-CM | POA: Diagnosis not present

## 2022-06-19 DIAGNOSIS — M130 Polyarthritis, unspecified: Secondary | ICD-10-CM | POA: Diagnosis not present

## 2022-06-26 ENCOUNTER — Other Ambulatory Visit: Payer: Self-pay | Admitting: Cardiology

## 2022-06-26 DIAGNOSIS — I480 Paroxysmal atrial fibrillation: Secondary | ICD-10-CM | POA: Diagnosis not present

## 2022-06-27 LAB — BASIC METABOLIC PANEL
BUN/Creatinine Ratio: 18 (ref 12–28)
BUN: 19 mg/dL (ref 10–36)
CO2: 21 mmol/L (ref 20–29)
Calcium: 10 mg/dL (ref 8.7–10.3)
Chloride: 94 mmol/L — ABNORMAL LOW (ref 96–106)
Creatinine, Ser: 1.03 mg/dL — ABNORMAL HIGH (ref 0.57–1.00)
Glucose: 107 mg/dL — ABNORMAL HIGH (ref 70–99)
Potassium: 4.3 mmol/L (ref 3.5–5.2)
Sodium: 131 mmol/L — ABNORMAL LOW (ref 134–144)
eGFR: 50 mL/min/{1.73_m2} — ABNORMAL LOW (ref >59)

## 2022-06-29 NOTE — Progress Notes (Signed)
Looks like they did the labs on 06/26/22, they are in the chart. Do you want them redone?

## 2022-07-01 ENCOUNTER — Telehealth: Payer: Self-pay

## 2022-07-01 NOTE — Telephone Encounter (Signed)
Patient daughter wants to know if her mother should continue taking HCTZ since she has had labs drawn.

## 2022-07-02 NOTE — Progress Notes (Signed)
Called patient, NA, LMAM  Patent also has echocardiogram results.

## 2022-07-02 NOTE — Progress Notes (Signed)
Called patient, NA, LMAM  Patient also has echocardiogram results.

## 2022-07-03 ENCOUNTER — Ambulatory Visit: Payer: Medicare HMO | Admitting: Podiatry

## 2022-07-03 NOTE — Telephone Encounter (Signed)
Spoke with patient daughter concerning Dr. Brennan Bailey recommendation on continuing the HCTZ.

## 2022-07-03 NOTE — Progress Notes (Signed)
Patient daughter wants to know her mom's labs results. Could you please give me a brief synopsis of what to tell her. Thank you

## 2022-07-16 DIAGNOSIS — Z45018 Encounter for adjustment and management of other part of cardiac pacemaker: Secondary | ICD-10-CM | POA: Diagnosis not present

## 2022-07-16 DIAGNOSIS — I442 Atrioventricular block, complete: Secondary | ICD-10-CM | POA: Diagnosis not present

## 2022-07-21 DIAGNOSIS — H903 Sensorineural hearing loss, bilateral: Secondary | ICD-10-CM | POA: Diagnosis not present

## 2022-07-21 DIAGNOSIS — R6889 Other general symptoms and signs: Secondary | ICD-10-CM | POA: Diagnosis not present

## 2022-08-02 ENCOUNTER — Other Ambulatory Visit: Payer: Self-pay | Admitting: Cardiology

## 2022-08-02 DIAGNOSIS — Z7901 Long term (current) use of anticoagulants: Secondary | ICD-10-CM

## 2022-08-17 DIAGNOSIS — H903 Sensorineural hearing loss, bilateral: Secondary | ICD-10-CM | POA: Diagnosis not present

## 2022-08-17 DIAGNOSIS — R6889 Other general symptoms and signs: Secondary | ICD-10-CM | POA: Diagnosis not present

## 2022-09-11 DIAGNOSIS — I10 Essential (primary) hypertension: Secondary | ICD-10-CM | POA: Diagnosis not present

## 2022-09-11 DIAGNOSIS — J449 Chronic obstructive pulmonary disease, unspecified: Secondary | ICD-10-CM | POA: Diagnosis not present

## 2022-09-11 DIAGNOSIS — E785 Hyperlipidemia, unspecified: Secondary | ICD-10-CM | POA: Diagnosis not present

## 2022-09-21 DIAGNOSIS — R6889 Other general symptoms and signs: Secondary | ICD-10-CM | POA: Diagnosis not present

## 2022-09-21 DIAGNOSIS — M130 Polyarthritis, unspecified: Secondary | ICD-10-CM | POA: Diagnosis not present

## 2022-09-21 DIAGNOSIS — J449 Chronic obstructive pulmonary disease, unspecified: Secondary | ICD-10-CM | POA: Diagnosis not present

## 2022-09-21 DIAGNOSIS — R208 Other disturbances of skin sensation: Secondary | ICD-10-CM | POA: Diagnosis not present

## 2022-09-21 DIAGNOSIS — I10 Essential (primary) hypertension: Secondary | ICD-10-CM | POA: Diagnosis not present

## 2022-09-21 DIAGNOSIS — I739 Peripheral vascular disease, unspecified: Secondary | ICD-10-CM | POA: Diagnosis not present

## 2022-09-21 DIAGNOSIS — I119 Hypertensive heart disease without heart failure: Secondary | ICD-10-CM | POA: Diagnosis not present

## 2022-09-21 DIAGNOSIS — Z6834 Body mass index (BMI) 34.0-34.9, adult: Secondary | ICD-10-CM | POA: Diagnosis not present

## 2022-09-21 DIAGNOSIS — E785 Hyperlipidemia, unspecified: Secondary | ICD-10-CM | POA: Diagnosis not present

## 2022-09-21 DIAGNOSIS — Z950 Presence of cardiac pacemaker: Secondary | ICD-10-CM | POA: Diagnosis not present

## 2022-10-15 DIAGNOSIS — I442 Atrioventricular block, complete: Secondary | ICD-10-CM | POA: Diagnosis not present

## 2022-10-15 DIAGNOSIS — Z950 Presence of cardiac pacemaker: Secondary | ICD-10-CM | POA: Diagnosis not present

## 2022-10-23 DIAGNOSIS — J449 Chronic obstructive pulmonary disease, unspecified: Secondary | ICD-10-CM | POA: Diagnosis not present

## 2022-10-23 DIAGNOSIS — I10 Essential (primary) hypertension: Secondary | ICD-10-CM | POA: Diagnosis not present

## 2022-10-23 DIAGNOSIS — M130 Polyarthritis, unspecified: Secondary | ICD-10-CM | POA: Diagnosis not present

## 2022-10-23 DIAGNOSIS — E782 Mixed hyperlipidemia: Secondary | ICD-10-CM | POA: Diagnosis not present

## 2022-10-29 ENCOUNTER — Other Ambulatory Visit: Payer: Self-pay | Admitting: Cardiology

## 2022-10-29 DIAGNOSIS — Z7901 Long term (current) use of anticoagulants: Secondary | ICD-10-CM

## 2022-11-05 ENCOUNTER — Ambulatory Visit: Payer: Medicare HMO | Admitting: Cardiology

## 2022-11-05 ENCOUNTER — Encounter: Payer: Self-pay | Admitting: Cardiology

## 2022-11-05 DIAGNOSIS — I442 Atrioventricular block, complete: Secondary | ICD-10-CM

## 2022-11-05 DIAGNOSIS — Z45018 Encounter for adjustment and management of other part of cardiac pacemaker: Secondary | ICD-10-CM

## 2022-11-05 DIAGNOSIS — R6889 Other general symptoms and signs: Secondary | ICD-10-CM | POA: Diagnosis not present

## 2022-11-05 DIAGNOSIS — Z950 Presence of cardiac pacemaker: Secondary | ICD-10-CM

## 2022-11-05 NOTE — Progress Notes (Signed)
ICD-10-CM   1. Encounter for care of pacemaker  Z45.018     2. Pacemaker Dual chamber Medtronic Advisa DR MRI SureScan 01/29/2014  Z95.0     3. Heart block AV complete (HCC)  I44.2      Scheduled  In office pacemaker check 11/05/22  Single (S)/Dual (D)/BV: D. Presenting ASVP. Pacemaker dependant:  Yes. Underlying CHB. AP 27%, VP 100%.  AMS Episodes 0.   HVR 7. Longest 2 Sec. Latest 09/15/22. Longevity 7  months. Magnet rate: >85%. Lead measurements: Stable.  Histogram: Low (L)/normal (N)/high (H)  Normal. Patient activity Low.   Observations: Dual-chamber pacemaker function. Changes: None.  Remote dual-chamber pacemaker transmission 10/15/2022: Longevity 5 months. AP 31 %, VP 100%. Lead impedance and thresholds are normal.  There were 2 high ventricular rate episode, brief NSVT 2 seconds, no high atrial rate episodes.  Normal pacemaker function.  Patient is pacemaker dependent.  She has 5 months of battery life.  I will schedule her for a in person device check in 4 months.  Will continue remote monitoring.   Yates Decamp, MD, [facility] 11/05/2022, 11:58 AM Office: [phone] Fax: [phone] Pager: [phone]

## 2022-11-06 DIAGNOSIS — E782 Mixed hyperlipidemia: Secondary | ICD-10-CM | POA: Diagnosis not present

## 2022-11-06 DIAGNOSIS — R6889 Other general symptoms and signs: Secondary | ICD-10-CM | POA: Diagnosis not present

## 2022-11-06 DIAGNOSIS — M130 Polyarthritis, unspecified: Secondary | ICD-10-CM | POA: Diagnosis not present

## 2022-11-06 DIAGNOSIS — J449 Chronic obstructive pulmonary disease, unspecified: Secondary | ICD-10-CM | POA: Diagnosis not present

## 2022-11-06 DIAGNOSIS — I10 Essential (primary) hypertension: Secondary | ICD-10-CM | POA: Diagnosis not present

## 2022-11-24 ENCOUNTER — Observation Stay (HOSPITAL_COMMUNITY)
Admission: EM | Admit: 2022-11-24 | Discharge: 2022-11-26 | Disposition: A | Discharge status: Home / Self Care | Payer: Medicare HMO | Attending: Family Medicine | Admitting: Family Medicine

## 2022-11-24 ENCOUNTER — Emergency Department (HOSPITAL_COMMUNITY): Payer: Medicare HMO

## 2022-11-24 ENCOUNTER — Encounter (HOSPITAL_COMMUNITY): Payer: Self-pay | Admitting: Emergency Medicine

## 2022-11-24 ENCOUNTER — Other Ambulatory Visit: Payer: Self-pay

## 2022-11-24 DIAGNOSIS — U071 COVID-19: Secondary | ICD-10-CM

## 2022-11-24 DIAGNOSIS — Z79899 Other long term (current) drug therapy: Secondary | ICD-10-CM | POA: Diagnosis not present

## 2022-11-24 DIAGNOSIS — I517 Cardiomegaly: Secondary | ICD-10-CM | POA: Diagnosis not present

## 2022-11-24 DIAGNOSIS — Z87891 Personal history of nicotine dependence: Secondary | ICD-10-CM | POA: Diagnosis not present

## 2022-11-24 DIAGNOSIS — I480 Paroxysmal atrial fibrillation: Secondary | ICD-10-CM | POA: Insufficient documentation

## 2022-11-24 DIAGNOSIS — J45909 Unspecified asthma, uncomplicated: Secondary | ICD-10-CM | POA: Diagnosis present

## 2022-11-24 DIAGNOSIS — I10 Essential (primary) hypertension: Secondary | ICD-10-CM | POA: Diagnosis present

## 2022-11-24 DIAGNOSIS — Z7982 Long term (current) use of aspirin: Secondary | ICD-10-CM | POA: Insufficient documentation

## 2022-11-24 DIAGNOSIS — R059 Cough, unspecified: Secondary | ICD-10-CM | POA: Diagnosis not present

## 2022-11-24 DIAGNOSIS — Z7901 Long term (current) use of anticoagulants: Secondary | ICD-10-CM | POA: Insufficient documentation

## 2022-11-24 DIAGNOSIS — Z950 Presence of cardiac pacemaker: Secondary | ICD-10-CM | POA: Diagnosis present

## 2022-11-24 DIAGNOSIS — R051 Acute cough: Principal | ICD-10-CM

## 2022-11-24 DIAGNOSIS — E871 Hypo-osmolality and hyponatremia: Secondary | ICD-10-CM | POA: Diagnosis not present

## 2022-11-24 DIAGNOSIS — E785 Hyperlipidemia, unspecified: Secondary | ICD-10-CM | POA: Diagnosis present

## 2022-11-24 LAB — RESP PANEL BY RT-PCR (RSV, FLU A&B, COVID)  RVPGX2
Influenza A by PCR: NEGATIVE
Influenza B by PCR: NEGATIVE
Resp Syncytial Virus by PCR: NEGATIVE
SARS Coronavirus 2 by RT PCR: POSITIVE — AB

## 2022-11-24 NOTE — ED Triage Notes (Signed)
Patient endorses congested cough x 3 days.  Patient had large family gathering approx 10 days ago but wasn't around anyone sick that she knows of.

## 2022-11-25 DIAGNOSIS — E785 Hyperlipidemia, unspecified: Secondary | ICD-10-CM

## 2022-11-25 DIAGNOSIS — I10 Essential (primary) hypertension: Secondary | ICD-10-CM

## 2022-11-25 DIAGNOSIS — U071 COVID-19: Secondary | ICD-10-CM

## 2022-11-25 DIAGNOSIS — E871 Hypo-osmolality and hyponatremia: Secondary | ICD-10-CM | POA: Diagnosis not present

## 2022-11-25 DIAGNOSIS — Z950 Presence of cardiac pacemaker: Secondary | ICD-10-CM

## 2022-11-25 DIAGNOSIS — J45909 Unspecified asthma, uncomplicated: Secondary | ICD-10-CM | POA: Diagnosis not present

## 2022-11-25 LAB — BASIC METABOLIC PANEL
Anion gap: 13 (ref 5–15)
Anion gap: 12 (ref 5–15)
BUN: 16 mg/dL (ref 8–23)
BUN: 15 mg/dL (ref 8–23)
CO2: 20 mmol/L — ABNORMAL LOW (ref 22–32)
CO2: 19 mmol/L — ABNORMAL LOW (ref 22–32)
Calcium: 8.9 mg/dL (ref 8.9–10.3)
Calcium: 8.5 mg/dL — ABNORMAL LOW (ref 8.9–10.3)
Chloride: 91 mmol/L — ABNORMAL LOW (ref 98–111)
Chloride: 94 mmol/L — ABNORMAL LOW (ref 98–111)
Creatinine, Ser: 1.01 mg/dL — ABNORMAL HIGH (ref 0.44–1.00)
Creatinine, Ser: 0.96 mg/dL (ref 0.44–1.00)
GFR, Estimated: 51 mL/min — ABNORMAL LOW (ref >60)
GFR, Estimated: 54 mL/min — ABNORMAL LOW (ref >60)
Glucose, Bld: 95 mg/dL (ref 70–99)
Glucose, Bld: 88 mg/dL (ref 70–99)
Potassium: 4 mmol/L (ref 3.5–5.1)
Potassium: 4 mmol/L (ref 3.5–5.1)
Sodium: 124 mmol/L — ABNORMAL LOW (ref 135–145)
Sodium: 125 mmol/L — ABNORMAL LOW (ref 135–145)

## 2022-11-25 LAB — CBC WITH DIFFERENTIAL/PLATELET
Abs Immature Granulocytes: 0.02 10*3/uL (ref 0.00–0.07)
Basophils Absolute: 0 10*3/uL (ref 0.0–0.1)
Basophils Relative: 0 %
Eosinophils Absolute: 0 10*3/uL (ref 0.0–0.5)
Eosinophils Relative: 0 %
HCT: 30 % — ABNORMAL LOW (ref 36.0–46.0)
Hemoglobin: 10.1 g/dL — ABNORMAL LOW (ref 12.0–15.0)
Immature Granulocytes: 0 %
Lymphocytes Relative: 19 %
Lymphs Abs: 0.9 10*3/uL (ref 0.7–4.0)
MCH: 30.9 pg (ref 26.0–34.0)
MCHC: 33.7 g/dL (ref 30.0–36.0)
MCV: 91.7 fL (ref 80.0–100.0)
Monocytes Absolute: 0.8 10*3/uL (ref 0.1–1.0)
Monocytes Relative: 19 %
Neutro Abs: 2.8 10*3/uL (ref 1.7–7.7)
Neutrophils Relative %: 62 %
Platelets: 272 10*3/uL (ref 150–400)
RBC: 3.27 MIL/uL — ABNORMAL LOW (ref 3.87–5.11)
RDW: 13.7 % (ref 11.5–15.5)
WBC: 4.5 10*3/uL (ref 4.0–10.5)
nRBC: 0 % (ref 0.0–0.2)

## 2022-11-25 MED ORDER — ACETAMINOPHEN 325 MG PO TABS: 650.0000 mg | ORAL_TABLET | Freq: Four times a day (QID) | ORAL | Status: DC | PRN

## 2022-11-25 MED ORDER — ONDANSETRON HCL 4 MG/2ML IJ SOLN: 4.0000 mg | Freq: Four times a day (QID) | INTRAMUSCULAR | Status: DC | PRN

## 2022-11-25 MED ORDER — BUDESON-GLYCOPYRROL-FORMOTEROL 160-9-4.8 MCG/ACT IN AERO: 2.0000 | INHALATION_SPRAY | Freq: Two times a day (BID) | RESPIRATORY_TRACT | Status: DC

## 2022-11-25 MED ORDER — FLUTICASONE FUROATE-VILANTEROL 100-25 MCG/ACT IN AEPB
1.0000 | INHALATION_SPRAY | Freq: Every day | RESPIRATORY_TRACT | Status: DC
  Administered 2022-11-25 – 2022-11-26 (×2): 1 via RESPIRATORY_TRACT
  Filled 2022-11-25: qty 28

## 2022-11-25 MED ORDER — SODIUM CHLORIDE 0.9 % IV BOLUS
500.0000 mL | Freq: Once | INTRAVENOUS | Status: AC
  Administered 2022-11-25: 500 mL via INTRAVENOUS

## 2022-11-25 MED ORDER — MONTELUKAST SODIUM 10 MG PO TABS
10.0000 mg | ORAL_TABLET | Freq: Every day | ORAL | Status: DC
  Administered 2022-11-25: 10 mg via ORAL
  Filled 2022-11-25: qty 1

## 2022-11-25 MED ORDER — UMECLIDINIUM BROMIDE 62.5 MCG/ACT IN AEPB
1.0000 | INHALATION_SPRAY | Freq: Every day | RESPIRATORY_TRACT | Status: DC
  Administered 2022-11-25 – 2022-11-26 (×2): 1 via RESPIRATORY_TRACT
  Filled 2022-11-25: qty 7

## 2022-11-25 MED ORDER — BOOST / RESOURCE BREEZE PO LIQD CUSTOM
1.0000 | Freq: Three times a day (TID) | ORAL | Status: DC
  Administered 2022-11-25 – 2022-11-26 (×3): 1 via ORAL

## 2022-11-25 MED ORDER — IPRATROPIUM-ALBUTEROL 0.5-2.5 (3) MG/3ML IN SOLN: 3.0000 mL | Freq: Four times a day (QID) | RESPIRATORY_TRACT | Status: DC | PRN

## 2022-11-25 MED ORDER — SODIUM CHLORIDE 1 G PO TABS
1.0000 g | ORAL_TABLET | Freq: Four times a day (QID) | ORAL | Status: AC
  Administered 2022-11-25 (×2): 1 g via ORAL
  Filled 2022-11-25 (×3): qty 1

## 2022-11-25 MED ORDER — LOSARTAN POTASSIUM 50 MG PO TABS
50.0000 mg | ORAL_TABLET | Freq: Every day | ORAL | Status: DC
  Administered 2022-11-25 – 2022-11-26 (×2): 50 mg via ORAL
  Filled 2022-11-25 (×2): qty 1

## 2022-11-25 MED ORDER — APIXABAN 2.5 MG PO TABS
2.5000 mg | ORAL_TABLET | Freq: Two times a day (BID) | ORAL | Status: DC
  Administered 2022-11-25 – 2022-11-26 (×3): 2.5 mg via ORAL
  Filled 2022-11-25 (×3): qty 1

## 2022-11-25 MED ORDER — SODIUM CHLORIDE 1 G PO TABS
1.0000 g | ORAL_TABLET | Freq: Four times a day (QID) | ORAL | Status: DC
  Administered 2022-11-25: 1 g via ORAL
  Filled 2022-11-25 (×2): qty 1

## 2022-11-25 MED ORDER — NYSTATIN 100000 UNIT/GM EX POWD
Freq: Three times a day (TID) | CUTANEOUS | Status: DC
  Filled 2022-11-25: qty 15

## 2022-11-25 MED ORDER — AMLODIPINE BESYLATE 10 MG PO TABS
10.0000 mg | ORAL_TABLET | Freq: Every day | ORAL | Status: DC
  Administered 2022-11-25 – 2022-11-26 (×2): 10 mg via ORAL
  Filled 2022-11-25 (×2): qty 1

## 2022-11-25 MED ORDER — DEXAMETHASONE 0.5 MG PO TABS
0.5000 mg | ORAL_TABLET | Freq: Every day | ORAL | Status: DC
  Administered 2022-11-25 – 2022-11-26 (×2): 0.5 mg via ORAL
  Filled 2022-11-25 (×2): qty 1

## 2022-11-25 MED ORDER — ACETAMINOPHEN 650 MG RE SUPP: 650.0000 mg | Freq: Four times a day (QID) | RECTAL | Status: DC | PRN

## 2022-11-25 MED ORDER — ASPIRIN 81 MG PO TBEC
81.0000 mg | DELAYED_RELEASE_TABLET | Freq: Every day | ORAL | Status: DC
  Administered 2022-11-25 – 2022-11-26 (×2): 81 mg via ORAL
  Filled 2022-11-25 (×2): qty 1

## 2022-11-25 MED ORDER — ONDANSETRON HCL 4 MG PO TABS: 4.0000 mg | ORAL_TABLET | Freq: Four times a day (QID) | ORAL | Status: DC | PRN

## 2022-11-25 MED ORDER — ALBUTEROL SULFATE HFA 108 (90 BASE) MCG/ACT IN AERS
2.0000 | INHALATION_SPRAY | Freq: Once | RESPIRATORY_TRACT | Status: AC
  Administered 2022-11-25: 2 via RESPIRATORY_TRACT
  Filled 2022-11-25: qty 6.7

## 2022-11-25 MED ORDER — ATORVASTATIN CALCIUM 80 MG PO TABS
80.0000 mg | ORAL_TABLET | Freq: Every day | ORAL | Status: DC
  Administered 2022-11-25 – 2022-11-26 (×2): 80 mg via ORAL
  Filled 2022-11-25 (×2): qty 1

## 2022-11-25 NOTE — Progress Notes (Signed)
[facility] PROGRESS NOTE  Sarah Burns (DOB: 1924/07/15) [id number] PCP: Renaye Rakers, MD  Subjective: Sarah Burns is a very pleasant 89 y.o. female with a history of PAF, CHB s/p PPM, HTN, HLD, hyponatremia, asthma on chronic decadron who presented to the ED on 11/24/2022 with a couple days of cough, was found to be covid-19 positive without infiltrates on CXR or hypoxemia. Labs notable for hyponatremia at 124 felt to be lower than her chronic hyponatremic baseline and asymptomatic. She was admitted this morning with plans to monitor sodium levels. She requires no treatment for covid-19 but remains on isolation.   This morning she has no complaints, is breathing well, still with a nonproductive cough. Eating breakfast.   Objective: BP (!) 159/53   Pulse 61   Temp 98.8 F (37.1 C)   Resp 18   Ht 4\' 3"  (1.295 m)   Wt 59.9 kg   SpO2 100%   BMI 35.70 kg/m   Gen: No distress Pulm: Clear, nonlabored, nonproductive cough noted  CV: Regular, no edema GI: Soft, NT, ND, +BS Neuro: Alert and oriented. No new focal deficits. Ext: Warm, no deformities Skin: Left upper chest with nontender pacemaker. No acute rashes, lesions or ulcers on visualized skin   Assessment & Plan: Elderly but not very frail female presented for cough and diagnosed with covid-19 infection without pneumonia. Despite a history of steroid-dependent asthma, she does not have evidence of an exacerbation at this time. Her BMI is 35, and she carries diagnoses of HTN (no longer taking HCTZ), HLD, CHB s/p PPM. Antiviral therapy is considered for her based on these risk factors and short duration of symptoms. 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 Does not meet criteria for remdesivir and is already on chronic steroids, does not require this to be  increased at this time.   We will trend her sodium level. She is asymptomatic. If she continues to look and/or feel this well and has a safe disposition, she can be discharged once her sodium level improves closer to her baseline.   Tyrone Nine, MD [facility] [email or web address] 11/25/2022, 2:07 PM

## 2022-11-25 NOTE — Assessment & Plan Note (Signed)
Only with cough. No infiltrates on CXR. 99% RA sats. No treatment indicated.

## 2022-11-25 NOTE — Assessment & Plan Note (Signed)
Chronic. 

## 2022-11-25 NOTE — Consult Note (Addendum)
WOC Nurse Consult Note: Reason for Consult: diaper wounds Removed incontinence brief from patient and provided rationale for non use of briefs while inpatient, including current use of Dermatherapy under pad.  Wound type: Deep Tissue Pressure Injury: right upper buttock 2.0cm x 6cm x 0cm  Deep Tissue Pressure Injury: left upper buttock 1.0cm x 5.0cm x 0cm  Deep Tissue Pressure Injury; left elbow; 1cm x 1cm x 0cm  Pressure Injury POA: Yes Measurement: see above  Wound bed: dark purple,non blanchable tissue at each site  Drainage (amount, consistency, odor) none Periwound: intact  Dressing procedure/placement/frequency: Implemented a silicone foam dressing for the sacrum. Needs foam to the left elbow. Turn and reposition at least every two hours.  Chair Pressure Redistribution pad requested to be ordered for patient for use when up in the chair and to be sent home with patient for use at home/SNF.    Discussed POC with patient and bedside nurse.  Re consult if needed, will not follow at this time. Thanks   [employer], RN,CWOCN, CNS, CWON-AP [phone])

## 2022-11-25 NOTE — Assessment & Plan Note (Signed)
Pt currently on extended course of decadron 0.5 mg daily. Per the dtr.

## 2022-11-25 NOTE — Progress Notes (Signed)
   11/25/22 1400  Spiritual Encounters  Type of Visit Initial  Care provided to: Patient  Conversation partners present during encounter Nurse  Referral source Nurse (RN/NT/LPN)  Reason for visit Advance directives  OnCall Visit No   Ch responded to request for AD. Pt declined AD. She said she did not order AD. No follow-up needed at this time.

## 2022-11-25 NOTE — Assessment & Plan Note (Signed)
Observation med/surg bed. 500 ml NS to be given in ER. Will give some additional salt tabs. Pt just stopped hydrochlorothiazide(due to constant urination) last week. Pt with chronic hyponatremia. Baseline sodium 126-134. Pt will likely go home this afternoon. Repeat BMP at 3 pm.

## 2022-11-25 NOTE — H&P (Signed)
History and Physical    Sarah Burns WCB:[id number] DOB: 1925-03-09 DOA: 11/24/2022  DOS: the patient was seen and examined on 11/24/2022  PCP: Renaye Rakers, MD   Patient coming from: Home  I have personally briefly reviewed patient's old medical records in [facility]  CC: cough HPI: 89 year old African-American female history of complete heart block status post pacemaker, hypertension, hyperlipidemia, asthma, chronic hyponatremia (baseline sodium 126-134) presents to the ER today with a 2-day history of cough.  Patient was at a family unit about 10 days ago.  She developed a cough.  No fever.  Patient is being treated with dexamethasone for asthma.  This is being ongoing for several weeks now.  Patient's here in the ER with her daughter Gavin Pound who she lives with.  Daughter states that patient stopped taking her hydrochlorothiazide about a week ago due to it causing her to urinate too much and her not being will get to the bathroom in time.  Arrival temp 97.9 heart rate 77 blood pressure 147/54 satting 90% on room air.  Labs:  COVID-positive White count 4.5, hemoglobin 10.1, platelet 272  Sodium 124, potassium 4.0, chloride 91, bicarb 20, BUN of 16, creatinine 1.0, glucose 95  Chest x-ray negative for acute cardiopulmonary disease.  [facility] hospitalist consulted.    ED Course: Na 124. Covid Positive. CXR negative.  Review of Systems:  Review of Systems  Constitutional: Negative.  Negative for chills and fever.  HENT: Negative.    Eyes: Negative.   Respiratory:  Positive for cough.   Cardiovascular: Negative.   Gastrointestinal: Negative.   Genitourinary: Negative.   Musculoskeletal: Negative.   Skin: Negative.   Neurological: Negative.   Endo/Heme/Allergies: Negative.   Psychiatric/Behavioral: Negative.      Past Medical History:  Diagnosis Date   Acute cholecystitis 09/17/2019   Allergies    Asthma    Bilateral carotid artery disease (HCC)    Encounter for  care of pacemaker 06/17/2020   Heart block AV complete (HCC) 06/17/2020   Hyperlipidemia    Hypertension    Pacemaker Dual chamber Medtronic Advisa DR MRI SureScan 01/29/2014 06/17/2020   Paroxysmal A-fib (HCC)     Past Surgical History:  Procedure Laterality Date   CHOLECYSTECTOMY N/A 09/19/2019   Procedure: LAPAROSCOPIC CHOLECYSTECTOMY;  Surgeon: Axel Filler, MD;  Location: [facility] OR;  Service: General;  Laterality: N/A;   GROIN DISSECTION     INSERT / REPLACE / REMOVE PACEMAKER     nasal polyp removal       reports that she has quit smoking. Her smoking use included cigarettes. She has a 5 pack-year smoking history. She has never used smokeless tobacco. She reports that she does not currently use alcohol. She reports that she does not use drugs.  Allergies  Allergen Reactions   Lactose Intolerance (Gi) Nausea And Vomiting and Other (See Comments)    Stomach pain   Penicillins Other (See Comments)    Unknown reaction     Family History  Problem Relation Age of Onset   Asthma Brother    Allergies Other    Asthma Other     Prior to Admission medications   Medication Sig Start Date End Date Taking? Authorizing Provider  dexamethasone (DECADRON) 0.5 MG tablet Take 0.5 mg by mouth daily.   Yes [provider]  acetaminophen (TYLENOL) 500 MG tablet Take 500 mg by mouth daily as needed for headache (pain).    [provider]  amLODipine (NORVASC) 10 MG tablet Take 10  mg by mouth daily.    [provider]  ASPIRIN LOW DOSE 81 MG EC tablet TAKE 1 TABLET(81 MG) BY MOUTH DAILY. SWALLOW WHOLE 08/07/20   Yates Decamp, MD  atorvastatin (LIPITOR) 80 MG tablet Take 80 mg by mouth daily.    [provider]  BREZTRI AEROSPHERE 160-9-4.8 MCG/ACT AERO SMARTSIG:2 Puff(s) By Mouth Twice Daily 11/07/20   Martina Sinner, MD  ciclopirox [facility]) 8 % solution Apply topically at bedtime. Apply over nail and surrounding skin. Apply daily over previous coat. After  seven (7) days, may remove with alcohol and continue cycle. 11/21/21   Vivi Barrack, DPM  ELIQUIS 2.5 MG TABS tablet TAKE 1 TABLET(2.5 MG) BY MOUTH TWICE DAILY 10/29/22   Tolia, Sunit, DO  guaiFENesin (MUCINEX) 600 MG 12 hr tablet Take 1 tablet (600 mg total) by mouth 2 (two) times daily as needed. Patient not taking: Reported on 05/05/2022 07/29/20   Burnadette Pop, MD  ipratropium-albuterol (DUONEB) 0.5-2.5 (3) MG/3ML SOLN Take 3 mLs by nebulization every 6 (six) hours as needed. 07/29/20   Burnadette Pop, MD  Lactase (LACTAID PO) Take 1 tablet by mouth as needed (when consuming dairy).    [provider]  losartan (COZAAR) 50 MG tablet Take 50 mg by mouth daily.    [provider]  montelukast (SINGULAIR) 10 MG tablet Take 10 mg by mouth at bedtime. 01/16/20   [provider]  OVER THE COUNTER MEDICATION Take 1 tablet by mouth daily. Super beets    [provider]    Physical Exam: Vitals:   11/25/22 0200 11/25/22 0230 11/25/22 0245 11/25/22 0252  BP: (!) 163/59 (!) 161/56    Pulse: 71 (!) 59 65   Resp:      Temp:      TempSrc:      SpO2: 98% 97% 100% (S) 99%  Weight:      Height:        Physical Exam Constitutional:      General: She is not in acute distress.    Appearance: She is normal weight. She is not toxic-appearing or diaphoretic.  HENT:     Head: Normocephalic and atraumatic.     Nose: Nose normal.  Eyes:     General: No scleral icterus. Cardiovascular:     Rate and Rhythm: Normal rate and regular rhythm.     Pulses: Normal pulses.  Pulmonary:     Effort: Pulmonary effort is normal. No respiratory distress.     Breath sounds: Normal breath sounds.  Abdominal:     General: Bowel sounds are normal. There is no distension.     Palpations: Abdomen is soft.     Tenderness: There is no abdominal tenderness. There is no guarding.  Musculoskeletal:     Right lower leg: No edema.     Left lower leg: No edema.  Skin:    General:  Skin is warm and dry.     Capillary Refill: Capillary refill takes less than 2 seconds.      Labs on Admission: I have personally reviewed following labs and imaging studies  CBC: Recent Labs  Lab 11/25/22 0307  WBC 4.5  NEUTROABS 2.8  HGB 10.1*  HCT 30.0*  MCV 91.7  PLT 272   Basic Metabolic Panel: Recent Labs  Lab 11/25/22 0307  NA 124*  K 4.0  CL 91*  CO2 20*  GLUCOSE 95  BUN 16  CREATININE 1.01*  CALCIUM 8.9   GFR: Estimated Creatinine Clearance:  20 mL/min (A) (by C-G formula based on SCr of 1.01 mg/dL (H)). Liver Function Tests: No results for input(s): "AST", "ALT", "ALKPHOS", "BILITOT", "PROT", "ALBUMIN" in the last 168 hours. No results for input(s): "LIPASE", "AMYLASE" in the last 168 hours. No results for input(s): "AMMONIA" in the last 168 hours. Coagulation Profile: No results for input(s): "INR", "PROTIME" in the last 168 hours. Cardiac Enzymes: No results for input(s): "CKTOTAL", "CKMB", "CKMBINDEX", "TROPONINI", "TROPONINIHS" in the last 168 hours. BNP (last 3 results) No results for input(s): "BNP" in the last 8760 hours. HbA1C: No results for input(s): "HGBA1C" in the last 72 hours. CBG: No results for input(s): "GLUCAP" in the last 168 hours. Lipid Profile: No results for input(s): "CHOL", "HDL", "LDLCALC", "TRIG", "CHOLHDL", "LDLDIRECT" in the last 72 hours. Thyroid Function Tests: No results for input(s): "TSH", "T4TOTAL", "FREET4", "T3FREE", "THYROIDAB" in the last 72 hours. Anemia Panel: No results for input(s): "VITAMINB12", "FOLATE", "FERRITIN", "TIBC", "IRON", "RETICCTPCT" in the last 72 hours. Urine analysis:    Component Value Date/Time   COLORURINE YELLOW 09/18/2019 1030   APPEARANCEUR CLEAR 09/18/2019 1030   LABSPEC 1.018 09/18/2019 1030   PHURINE 6.0 09/18/2019 1030   GLUCOSEU NEGATIVE 09/18/2019 1030   HGBUR MODERATE (A) 09/18/2019 1030   BILIRUBINUR NEGATIVE 09/18/2019 1030   KETONESUR NEGATIVE 09/18/2019 1030   PROTEINUR  >=300 (A) 09/18/2019 1030   NITRITE NEGATIVE 09/18/2019 1030   LEUKOCYTESUR NEGATIVE 09/18/2019 1030    Radiological Exams on Admission: I have personally reviewed images DG Chest 2 View  Result Date: 11/24/2022 CLINICAL DATA:  Cough EXAM: CHEST - 2 VIEW COMPARISON:  Chest radiograph 07/27/2020 FINDINGS: The left chest wall cardiac device and associated leads are stable. The cardiomediastinal silhouette is stable, with unchanged borderline cardiomegaly. The upper mediastinal contours are normal Linear opacities in the lingula are unchanged, likely reflecting scar. There is no focal consolidation or pulmonary edema. There is no pleural effusion or pneumothorax There is no acute osseous abnormality. IMPRESSION: Stable chest with no radiographic evidence of acute cardiopulmonary process. Electronically Signed   By: Lesia Hausen M.D.   On: 11/24/2022 20:56    EKG: My personal interpretation of EKG shows: no EKG to review  Assessment/Plan Principal Problem:   Chronic hyponatremia Active Problems:   COVID-19 virus infection   Hypertension   Asthma   Hyperlipidemia   Pacemaker Dual chamber Medtronic Advisa DR MRI SureScan 01/29/2014  Assessment and Plan: * Chronic hyponatremia Observation med/surg bed. 500 ml NS to be given in ER. Will give some additional salt tabs. Pt just stopped hydrochlorothiazide(due to constant urination) last week. Pt with chronic hyponatremia. Baseline sodium 126-134. Pt will likely go home this afternoon. Repeat BMP at 3 pm.  COVID-19 virus infection Only with cough. No infiltrates on CXR. 99% RA sats. No treatment indicated.  Pacemaker Dual chamber Medtronic Advisa DR MRI SureScan 01/29/2014 Chronic.  Hyperlipidemia Continue lipitor.  Asthma Pt currently on extended course of decadron 0.5 mg daily. Per the dtr.  Hypertension Continue cozaar and norvasc.   DVT prophylaxis: Eliquis Code Status: Full Code Family Communication: discussed with pt's dtr  deborah at bedside  Disposition Plan: return home  Consults called: none  Admission status: Observation, Med-Surg   Carollee Herter, DO [facility] 11/25/2022, 4:25 AM

## 2022-11-25 NOTE — ED Provider Notes (Signed)
[place] EMERGENCY DEPARTMENT AT [facility] Provider Note   CSN: [id number] Arrival date & time: 11/24/22  2009     History  Chief Complaint  Patient presents with   Cough    Sarah Burns is a 89 y.o. female.   Cough    89 year old female with medical history significant for hypertension, asthma, hyperlipidemia, pacemaker in place due to complete heart block who presents to the emergency department with a cough for the past 3 days.  She was at a large family gathering roughly 10 days ago.  No known clear sick contacts.  No fevers or chills.  She denies any chest pain, abdominal pain, nausea or vomiting.  She is tolerating oral intake.  Home Medications Prior to Admission medications   Medication Sig Start Date End Date Taking? Authorizing Provider  acetaminophen (TYLENOL) 500 MG tablet Take 500 mg by mouth daily as needed for headache (pain).    [provider]  amLODipine (NORVASC) 10 MG tablet Take 10 mg by mouth daily.    [provider]  ASPIRIN LOW DOSE 81 MG EC tablet TAKE 1 TABLET(81 MG) BY MOUTH DAILY. SWALLOW WHOLE 08/07/20   Yates Decamp, MD  atorvastatin (LIPITOR) 80 MG tablet Take 80 mg by mouth daily.    [provider]  BREZTRI AEROSPHERE 160-9-4.8 MCG/ACT AERO SMARTSIG:2 Puff(s) By Mouth Twice Daily 11/07/20   Martina Sinner, MD  ciclopirox [facility]) 8 % solution Apply topically at bedtime. Apply over nail and surrounding skin. Apply daily over previous coat. After seven (7) days, may remove with alcohol and continue cycle. 11/21/21   Vivi Barrack, DPM  ELIQUIS 2.5 MG TABS tablet TAKE 1 TABLET(2.5 MG) BY MOUTH TWICE DAILY 10/29/22   Tolia, Sunit, DO  guaiFENesin (MUCINEX) 600 MG 12 hr tablet Take 1 tablet (600 mg total) by mouth 2 (two) times daily as needed. Patient not taking: Reported on 05/05/2022 07/29/20   Burnadette Pop, MD  hydrochlorothiazide (HYDRODIURIL) 12.5 MG tablet Take 12.5 mg by mouth every morning. 10/23/20    [provider]  ipratropium-albuterol (DUONEB) 0.5-2.5 (3) MG/3ML SOLN Take 3 mLs by nebulization every 6 (six) hours as needed. 07/29/20   Burnadette Pop, MD  Lactase (LACTAID PO) Take 1 tablet by mouth as needed (when consuming dairy).    [provider]  losartan (COZAAR) 50 MG tablet Take 50 mg by mouth daily.    [provider]  metoprolol succinate (TOPROL XL) 25 MG 24 hr tablet Take 1 tablet (25 mg total) by mouth daily. Patient not taking: Reported on 05/05/2022 10/30/21 01/28/22  Tolia, Sunit, DO  montelukast (SINGULAIR) 10 MG tablet Take 10 mg by mouth at bedtime. 01/16/20   [provider]  OVER THE COUNTER MEDICATION Take 1 tablet by mouth daily. Super beets    [provider]      Allergies    Lactose intolerance (gi) and Penicillins    Review of Systems   Review of Systems  Respiratory:  Positive for cough.   All other systems reviewed and are negative.   Physical Exam Updated Vital Signs BP (!) 161/56   Pulse 65   Temp 98.2 F (36.8 C) (Oral)   Resp 20   Ht 4\' 3"  (1.295 m)   Wt 59.9 kg   SpO2 (S) 99%   BMI 35.70 kg/m  Physical Exam Vitals and nursing note reviewed.  Constitutional:      General: She is not in acute distress.  Appearance: She is well-developed.     Comments: Frail, elderly female in NAD  HENT:     Head: Normocephalic and atraumatic.  Eyes:     Conjunctiva/sclera: Conjunctivae normal.  Cardiovascular:     Rate and Rhythm: Normal rate and regular rhythm.  Pulmonary:     Effort: Pulmonary effort is normal. No respiratory distress.     Breath sounds: Wheezing present.  Abdominal:     Palpations: Abdomen is soft.     Tenderness: There is no abdominal tenderness.  Musculoskeletal:        General: No swelling.     Cervical back: Neck supple.  Skin:    General: Skin is warm and dry.     Capillary Refill: Capillary refill takes less than 2 seconds.  Neurological:     Mental Status: She is alert.   Psychiatric:        Mood and Affect: Mood normal.     ED Results / Procedures / Treatments   Labs (all labs ordered are listed, but only abnormal results are displayed) Labs Reviewed  RESP PANEL BY RT-PCR (RSV, FLU A&B, COVID)  RVPGX2 - Abnormal; Notable for the following components:      Result Value   SARS Coronavirus 2 by RT PCR POSITIVE (*)    All other components within normal limits  CBC WITH DIFFERENTIAL/PLATELET - Abnormal; Notable for the following components:   RBC 3.27 (*)    Hemoglobin 10.1 (*)    HCT 30.0 (*)    All other components within normal limits  BASIC METABOLIC PANEL - Abnormal; Notable for the following components:   Sodium 124 (*)    Chloride 91 (*)    CO2 20 (*)    Creatinine, Ser 1.01 (*)    GFR, Estimated 51 (*)    All other components within normal limits    EKG None  Radiology DG Chest 2 View  Result Date: 11/24/2022 CLINICAL DATA:  Cough EXAM: CHEST - 2 VIEW COMPARISON:  Chest radiograph 07/27/2020 FINDINGS: The left chest wall cardiac device and associated leads are stable. The cardiomediastinal silhouette is stable, with unchanged borderline cardiomegaly. The upper mediastinal contours are normal Linear opacities in the lingula are unchanged, likely reflecting scar. There is no focal consolidation or pulmonary edema. There is no pleural effusion or pneumothorax There is no acute osseous abnormality. IMPRESSION: Stable chest with no radiographic evidence of acute cardiopulmonary process. Electronically Signed   By: Lesia Hausen M.D.   On: 11/24/2022 20:56    Procedures Procedures    Medications Ordered in ED Medications  sodium chloride 0.9 % bolus 500 mL (has no administration in time range)  albuterol (VENTOLIN HFA) 108 (90 Base) MCG/ACT inhaler 2 puff (2 puffs Inhalation Given 11/25/22 0220)    ED Course/ Medical Decision Making/ A&P Clinical Course as of 11/25/22 0404  Wed Nov 25, 2022  0135 SARS Coronavirus 2 by RT PCR(!): POSITIVE  [JL]    Clinical Course User Index [JL] Ernie Avena, MD                                 Medical Decision Making Amount and/or Complexity of Data Reviewed Labs: ordered. Decision-making details documented in ED Course. Radiology: ordered.  Risk Prescription drug management. Decision regarding hospitalization.    89 year old female with medical history significant for hypertension, asthma, hyperlipidemia, pacemaker in place due to complete heart block who presents to the emergency  department with a cough for the past 3 days.  She was at a large family gathering roughly 10 days ago.  No known clear sick contacts.  No fevers or chills.  She denies any chest pain, abdominal pain, nausea or vomiting.  She is tolerating oral intake.  On arrival, the patient was afebrile, not tachycardic or tachypneic, hemodynamically stable, saturating 98% on room air.  Physical exam significant for mild expiratory wheezing present.  Suspect likely viral upper respiratory infection, COVID-19, possibly pneumonia.  The patient was administered albuterol inhaler.  Chest x-ray was performed which revealed clear lungs bilaterally.  No focal consolidation to suggest pneumonia, no pneumothorax.  Ambulatory pulse oximetry was performed and was normal.  Labs: Covid PCR resulted positive, CBC without a leukocytosis, mild anemia to 10.1, BMP revealed hyponatremia to 124.  Suspect likely hyponatremia in the setting of the patient's thiazide diuretic use.  A 500 cc NaCl bolus was ordered.  Considered admission for observation versus discharge, discussed with family at bedside.  The patient is not hypoxic, overall well-appearing, tolerating oral intake.  In the setting of her hyponatremia, I recommended admission for observation and continued monitoring of her serum sodium levels. Dr. Imogene Burn of hospitalist medicine was consulted and accepted the patient in admission.  Final Clinical Impression(s) / ED Diagnoses Final  diagnoses:  Acute cough  COVID-19  Hyponatremia    Rx / DC Orders ED Discharge Orders     None         Ernie Avena, MD 11/25/22 [phone]

## 2022-11-25 NOTE — Assessment & Plan Note (Signed)
Continue cozaar and norvasc.

## 2022-11-25 NOTE — Subjective & Objective (Signed)
CC: cough HPI: 89 year old African-American female history of complete heart block status post pacemaker, hypertension, hyperlipidemia, asthma, chronic hyponatremia (baseline sodium 126-134) presents to the ER today with a 2-day history of cough.  Patient was at a family unit about 10 days ago.  She developed a cough.  No fever.  Patient is being treated with dexamethasone for asthma.  This is being ongoing for several weeks now.  Patient's here in the ER with her daughter Gavin Pound who she lives with.  Daughter states that patient stopped taking her hydrochlorothiazide about a week ago due to it causing her to urinate too much and her not being will get to the bathroom in time.  Arrival temp 97.9 heart rate 77 blood pressure 147/54 satting 90% on room air.  Labs:  COVID-positive White count 4.5, hemoglobin 10.1, platelet 272  Sodium 124, potassium 4.0, chloride 91, bicarb 20, BUN of 16, creatinine 1.0, glucose 95  Chest x-ray negative for acute cardiopulmonary disease.  [facility] hospitalist consulted.

## 2022-11-25 NOTE — Assessment & Plan Note (Signed)
Continue lipitor  ?

## 2022-11-26 ENCOUNTER — Other Ambulatory Visit (HOSPITAL_COMMUNITY): Payer: Self-pay

## 2022-11-26 DIAGNOSIS — E871 Hypo-osmolality and hyponatremia: Secondary | ICD-10-CM | POA: Diagnosis not present

## 2022-11-26 LAB — BASIC METABOLIC PANEL
Anion gap: 8 (ref 5–15)
BUN: 15 mg/dL (ref 8–23)
CO2: 22 mmol/L (ref 22–32)
Calcium: 8.4 mg/dL — ABNORMAL LOW (ref 8.9–10.3)
Chloride: 97 mmol/L — ABNORMAL LOW (ref 98–111)
Creatinine, Ser: 1.05 mg/dL — ABNORMAL HIGH (ref 0.44–1.00)
GFR, Estimated: 49 mL/min — ABNORMAL LOW (ref >60)
Glucose, Bld: 119 mg/dL — ABNORMAL HIGH (ref 70–99)
Potassium: 3.8 mmol/L (ref 3.5–5.1)
Sodium: 127 mmol/L — ABNORMAL LOW (ref 135–145)

## 2022-11-26 MED ORDER — ALBUTEROL SULFATE (2.5 MG/3ML) 0.083% IN NEBU
2.5000 mg | INHALATION_SOLUTION | RESPIRATORY_TRACT | 0 refills | Status: AC | PRN
  Filled 2022-11-26: qty 90, 5d supply, fill #0

## 2022-11-26 NOTE — Discharge Summary (Signed)
Physician Discharge Summary   Patient: Sarah Burns MRN: [id number] DOB: 1925-01-20  Admit date:     11/24/2022  Discharge date: 11/26/22  Discharge Physician: Tyrone Nine   PCP: Renaye Rakers, MD   Recommendations at discharge:  Follow up with PCP in 1-2 weeks with repeat of BMP and ongoing management and work up of hyponatremia.   Discharge Diagnoses: Principal Problem:   Chronic hyponatremia Active Problems:   COVID-19 virus infection   Hypertension   Asthma   Hyperlipidemia   Pacemaker Dual chamber Medtronic Advisa DR MRI SureScan 01/29/2014  Hospital Course: Sarah Burns is a very pleasant 89 y.o. female with a history of PAF, CHB s/p PPM, HTN, HLD, hyponatremia, asthma on chronic decadron who presented to the ED on 11/24/2022 with a couple days of cough, was found to be covid-19 positive without infiltrates on CXR or hypoxemia. Labs notable for hyponatremia at 124 felt to be lower than her chronic hyponatremic baseline and asymptomatic. She was admitted with plans to monitor sodium levels. Over the course of the next 24 hours, sodium level rose gradually to 127. Remains asymptomatic and eating well. Recommend fluid restriction at discharge and close PCP follow up. She requires no treatment for covid-19 but remains on isolation.    This morning she has no complaints, is breathing well, with less cough and no complaints.   Assessment and Plan: Elderly but not very frail female presented for cough and diagnosed with covid-19 infection without pneumonia. Despite a history of steroid-dependent asthma, she does not have evidence of an exacerbation at this time. Her BMI is 35, and she carries diagnoses of HTN (no longer taking HCTZ), HLD, CHB s/p PPM. Antiviral therapy is considered for her based on these risk factors and short duration of symptoms. 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 Does not meet criteria for remdesivir and is already on chronic steroids, does not require this to be increased at this time.    Hyponatremia: Acute on chronic. Suspect SIADH. Treat with fluid restriction and repeat labs as outpatient. She's asymptomatic and the risk of continued hospitalization outweighs risk of discharge home.   Consultants: None Procedures performed: None  Disposition: Home Diet recommendation: 1,500cc fluid restriction DISCHARGE MEDICATION: Allergies as of 11/26/2022       Reactions   Lactose Intolerance (gi) Nausea And Vomiting, Other (See Comments)   Stomach pain   Penicillins Other (See Comments)   Unknown reaction        Medication List     TAKE these medications    acetaminophen 500 MG tablet Commonly known as: TYLENOL Take 500 mg by mouth daily as needed for headache (pain).   amLODipine 10 MG tablet Commonly known as: NORVASC Take 10 mg by mouth daily.   Aspirin Low Dose 81 MG tablet Generic drug: aspirin EC TAKE 1 TABLET(81 MG) BY MOUTH DAILY. SWALLOW WHOLE   atorvastatin 80 MG tablet Commonly known as: LIPITOR Take 80 mg by mouth daily.   Breztri Aerosphere 160-9-4.8 MCG/ACT Aero Generic drug: Budeson-Glycopyrrol-Formoterol SMARTSIG:2 Puff(s) By Mouth Twice Daily   ciclopirox 8 % solution Commonly known as: Penlac Apply topically at bedtime. Apply over nail and surrounding skin. Apply daily over previous coat. After seven (7) days, may remove with alcohol and continue cycle.   dexamethasone 0.5 MG tablet Commonly known as: DECADRON Take 0.5  mg by mouth daily.   Eliquis 2.5 MG Tabs tablet Generic drug: apixaban TAKE 1 TABLET(2.5 MG) BY MOUTH TWICE DAILY   guaiFENesin 600 MG 12 hr tablet Commonly known as: MUCINEX Take 1 tablet (600 mg total) by mouth 2 (two) times daily as needed.   ipratropium-albuterol 0.5-2.5 (3) MG/3ML Soln Commonly known as:  DUONEB Take 3 mLs by nebulization every 6 (six) hours as needed.   LACTAID PO Take 1 tablet by mouth as needed (when consuming dairy).   losartan 50 MG tablet Commonly known as: COZAAR Take 50 mg by mouth daily.   montelukast 10 MG tablet Commonly known as: SINGULAIR Take 10 mg by mouth at bedtime.   OVER THE COUNTER MEDICATION Take 1 tablet by mouth daily. Super beets        Discharge Exam: Filed Weights   11/24/22 2019 11/25/22 0125  Weight: 59.9 kg 59.9 kg  BP (!) 157/48   Pulse 65   Temp 99.9 F (37.7 C)   Resp 18   Ht 4\' 3"  (1.295 m)   Wt 59.9 kg   SpO2 96%   BMI 35.70 kg/m   Elderly pleasant and alert and oriented female in no distress No edema, MMM.  Nonlabored  Condition at discharge: stable  The results of significant diagnostics from this hospitalization (including imaging, microbiology, ancillary and laboratory) are listed below for reference.   Imaging Studies: DG Chest 2 View  Result Date: 11/24/2022 CLINICAL DATA:  Cough EXAM: CHEST - 2 VIEW COMPARISON:  Chest radiograph 07/27/2020 FINDINGS: The left chest wall cardiac device and associated leads are stable. The cardiomediastinal silhouette is stable, with unchanged borderline cardiomegaly. The upper mediastinal contours are normal Linear opacities in the lingula are unchanged, likely reflecting scar. There is no focal consolidation or pulmonary edema. There is no pleural effusion or pneumothorax There is no acute osseous abnormality. IMPRESSION: Stable chest with no radiographic evidence of acute cardiopulmonary process. Electronically Signed   By: Lesia Hausen M.D.   On: 11/24/2022 20:56    Microbiology: Results for orders placed or performed during the hospital encounter of 11/24/22  Resp panel by RT-PCR (RSV, Flu A&B, Covid) Anterior Nasal Swab     Status: Abnormal   Collection Time: 11/24/22  8:23 PM   Specimen: Anterior Nasal Swab  Result Value Ref Range Status   SARS Coronavirus 2 by RT PCR  POSITIVE (A) NEGATIVE Final   Influenza A by PCR NEGATIVE NEGATIVE Final   Influenza B by PCR NEGATIVE NEGATIVE Final    Comment: (NOTE) The Xpert Xpress SARS-CoV-2/FLU/RSV plus assay is intended as an aid in the diagnosis of influenza from Nasopharyngeal swab specimens and should not be used as a sole basis for treatment. Nasal washings and aspirates are unacceptable for Xpert Xpress SARS-CoV-2/FLU/RSV testing.  Fact Sheet for Patients: BloggerCourse.com  Fact Sheet for Healthcare Providers: SeriousBroker.it  This test is not yet approved or cleared by the [place] FDA and has been authorized for detection and/or diagnosis of SARS-CoV-2 by FDA under an Emergency Use Authorization (EUA). This EUA will remain in effect (meaning this test can be used) for the duration of the COVID-19 declaration under Section 564(b)(1) of the Act, 21 U.S.C. section 360bbb-3(b)(1), unless the authorization is terminated or revoked.     Resp Syncytial Virus by PCR NEGATIVE NEGATIVE Final    Comment: (NOTE) Fact Sheet for Patients: BloggerCourse.com  Fact Sheet for Healthcare Providers: SeriousBroker.it  This test is not yet approved or cleared by the [place]  States FDA and has been authorized for detection and/or diagnosis of SARS-CoV-2 by FDA under an Emergency Use Authorization (EUA). This EUA will remain in effect (meaning this test can be used) for the duration of the COVID-19 declaration under Section 564(b)(1) of the Act, 21 U.S.C. section 360bbb-3(b)(1), unless the authorization is terminated or revoked.  Performed at [facility], [address] [place], [place] [address]     Labs: CBC: Recent Labs  Lab 11/25/22 0307  WBC 4.5  NEUTROABS 2.8  HGB 10.1*  HCT 30.0*  MCV 91.7  PLT 272   Basic Metabolic Panel: Recent Labs  Lab 11/25/22 0307 11/25/22 1441  11/26/22 0056  NA 124* 125* 127*  K 4.0 4.0 3.8  CL 91* 94* 97*  CO2 20* 19* 22  GLUCOSE 95 88 119*  BUN 16 15 15   CREATININE 1.01* 0.96 1.05*  CALCIUM 8.9 8.5* 8.4*   Liver Function Tests: No results for input(s): "AST", "ALT", "ALKPHOS", "BILITOT", "PROT", "ALBUMIN" in the last 168 hours. CBG: No results for input(s): "GLUCAP" in the last 168 hours.  Discharge time spent: greater than 30 minutes.  Signed: Tyrone Nine, MD [facility] 11/26/2022

## 2022-11-26 NOTE — Care Management Obs Status (Signed)
MEDICARE OBSERVATION STATUS NOTIFICATION   Patient Details  Name: Sarah Burns MRN: [id number] Date of Birth: March 02, 1925   Medicare Observation Status Notification Given:       Harriet Masson, RN 11/26/2022, 11:51 AM

## 2022-11-26 NOTE — TOC Transition Note (Signed)
Transition of Care [facility]) - CM/SW Discharge Note   Patient Details  Name: Sarah Burns MRN: [id number] Date of Birth: 08-16-1924  [facility]) CM/SW Contact:  Harriet Masson, RN Phone Number: 11/26/2022, 2:31 PM   Clinical Narrative:    Patient stable for discharge.  Daughter will transport home.  No TOC needs at this time.     Final next level of care: Home/Self Care Barriers to Discharge: Barriers Resolved   Patient Goals and CMS Choice    Return home  Discharge Placement               home          Discharge Plan and Services Additional resources added to the After Visit Summary for                                       Social Determinants of Health (SDOH) Interventions SDOH Screenings   Food Insecurity: No Food Insecurity (11/25/2022)  Housing: Low Risk  (11/25/2022)  Transportation Needs: No Transportation Needs (11/25/2022)  Utilities: Not At Risk (11/25/2022)  Tobacco Use: Medium Risk (11/24/2022)     Readmission Risk Interventions     No data to display

## 2022-12-10 DIAGNOSIS — M130 Polyarthritis, unspecified: Secondary | ICD-10-CM | POA: Diagnosis not present

## 2022-12-10 DIAGNOSIS — J449 Chronic obstructive pulmonary disease, unspecified: Secondary | ICD-10-CM | POA: Diagnosis not present

## 2022-12-10 DIAGNOSIS — U099 Post covid-19 condition, unspecified: Secondary | ICD-10-CM | POA: Diagnosis not present

## 2022-12-10 DIAGNOSIS — I10 Essential (primary) hypertension: Secondary | ICD-10-CM | POA: Diagnosis not present

## 2022-12-10 DIAGNOSIS — R410 Disorientation, unspecified: Secondary | ICD-10-CM | POA: Diagnosis not present

## 2022-12-10 DIAGNOSIS — E871 Hypo-osmolality and hyponatremia: Secondary | ICD-10-CM | POA: Diagnosis not present

## 2022-12-23 DIAGNOSIS — J449 Chronic obstructive pulmonary disease, unspecified: Secondary | ICD-10-CM | POA: Diagnosis not present

## 2022-12-23 DIAGNOSIS — I10 Essential (primary) hypertension: Secondary | ICD-10-CM | POA: Diagnosis not present

## 2022-12-23 DIAGNOSIS — E782 Mixed hyperlipidemia: Secondary | ICD-10-CM | POA: Diagnosis not present

## 2022-12-23 DIAGNOSIS — R410 Disorientation, unspecified: Secondary | ICD-10-CM | POA: Diagnosis not present

## 2022-12-28 ENCOUNTER — Ambulatory Visit: Payer: Medicare HMO | Admitting: Cardiology

## 2023-01-05 ENCOUNTER — Ambulatory Visit: Payer: Medicare HMO | Admitting: Cardiology

## 2023-01-05 DIAGNOSIS — R6889 Other general symptoms and signs: Secondary | ICD-10-CM | POA: Diagnosis not present

## 2023-01-13 DIAGNOSIS — R609 Edema, unspecified: Secondary | ICD-10-CM | POA: Diagnosis not present

## 2023-01-13 DIAGNOSIS — I119 Hypertensive heart disease without heart failure: Secondary | ICD-10-CM | POA: Diagnosis not present

## 2023-01-13 DIAGNOSIS — R6889 Other general symptoms and signs: Secondary | ICD-10-CM | POA: Diagnosis not present

## 2023-01-13 DIAGNOSIS — I10 Essential (primary) hypertension: Secondary | ICD-10-CM | POA: Diagnosis not present

## 2023-01-13 DIAGNOSIS — J449 Chronic obstructive pulmonary disease, unspecified: Secondary | ICD-10-CM | POA: Diagnosis not present

## 2023-01-13 DIAGNOSIS — Z23 Encounter for immunization: Secondary | ICD-10-CM | POA: Diagnosis not present

## 2023-02-04 ENCOUNTER — Other Ambulatory Visit: Payer: Self-pay | Admitting: Cardiology

## 2023-02-04 DIAGNOSIS — Z7901 Long term (current) use of anticoagulants: Secondary | ICD-10-CM

## 2023-02-04 NOTE — Telephone Encounter (Signed)
Eliquis 2.5mg  refill request received. Patient is 89 years old, weight-59.9kg, Crea-1.05 on 11/26/22, Diagnosis-Afib, and last seen by Dr. Odis Hollingshead on 05/05/22. Dose is appropriate based on dosing criteria. Will send in refill to requested pharmacy.

## 2023-02-12 DIAGNOSIS — R609 Edema, unspecified: Secondary | ICD-10-CM | POA: Diagnosis not present

## 2023-02-12 DIAGNOSIS — I10 Essential (primary) hypertension: Secondary | ICD-10-CM | POA: Diagnosis not present

## 2023-02-12 DIAGNOSIS — R6889 Other general symptoms and signs: Secondary | ICD-10-CM | POA: Diagnosis not present

## 2023-02-12 DIAGNOSIS — J449 Chronic obstructive pulmonary disease, unspecified: Secondary | ICD-10-CM | POA: Diagnosis not present

## 2023-02-12 DIAGNOSIS — I119 Hypertensive heart disease without heart failure: Secondary | ICD-10-CM | POA: Diagnosis not present

## 2023-03-03 NOTE — Progress Notes (Unsigned)
Electrophysiology Office Note:   Date:  03/04/2023  ID:  Sarah Burns [place], [place] 06-14-1924, MRN [id number]  Primary Cardiologist: None Electrophysiologist: Dr. Nelly Laurence > transfer of pt from PCV      History of Present Illness:   Sarah Burns is a 89 y.o. female with h/o CHB s/p PPM, paroxysmal AF, HTN, HLD, anemia seen today for routine electrophysiology followup.   Admitted 8/13-8/15/24 for hyponatremia in the setting of COVID infection.  The patient lives with her daughter. She can still prepare her breakfast. Does require assistance with ADL's.  She gets winded if she walks for a long time.   Since last being seen in our clinic the patient reports she has been doing well. No acute device related issues.  She denies chest pain, palpitations, dyspnea, PND, orthopnea, nausea, vomiting, dizziness, syncope, edema, weight gain, or early satiety.   Review of systems complete and found to be negative unless listed in HPI.   EP Information / Studies Reviewed:    EKG is ordered today. Personal review as below.  EKG Interpretation Date/Time:  Thursday March 04 2023 10:47:20 EST Ventricular Rate:  85 PR Interval:  166 QRS Duration:  164 QT Interval:  440 QTC Calculation: 523 R Axis:   268  Text Interpretation: Atrial-sensed ventricular-paced rhythm Confirmed by Canary Brim ([id number]) on 03/04/2023 12:31:34 PM   PPM Interrogation-  reviewed in detail today,  See PACEART report.  Device History: Medtronic Dual Chamber PPM implanted 01/29/2014 for CHB Dependent, no R waves at 40 bpm 03/04/23  Studies:  ECHO (PCV) 05/2012 > LVEF 60-65%, normal LV size, GII DD  Arrhythmia / AAD CHB s/p PPM   Paroxysmal AF    Risk Assessment/Calculations:    CHA2DS2-VASc Score = 5   This indicates a 7.2% annual risk of stroke. The patient's score is based upon: CHF History: 0 HTN History: 1 Diabetes History: 0 Stroke History: 0 Vascular Disease History: 1 Age Score: 2 Gender Score: 1              Physical Exam:   VS:  BP (!) 130/58   Pulse 85   Ht 4\' 3"  (1.295 m)   Wt 126 lb 12.8 oz (57.5 kg)   SpO2 96%   BMI 34.28 kg/m    Wt Readings from Last 3 Encounters:  03/04/23 126 lb 12.8 oz (57.5 kg)  11/25/22 132 lb 0.9 oz (59.9 kg)  05/05/22 127 lb 12.8 oz (58 kg)     GEN: Well nourished, well developed in no acute distress NECK: No JVD; No carotid bruits CARDIAC: Regular rate and rhythm, no murmurs, rubs, gallops RESPIRATORY:  Clear to auscultation without rales, wheezing or rhonchi  ABDOMEN: Soft, non-tender, non-distended EXTREMITIES:  No edema; No deformity   ASSESSMENT AND PLAN:    CHB s/p Medtronic PPM  -Normal PPM function -See Pace Art report -No changes today -nearing ERI, device notes 2 months left on battery -will begin pulling remote reports Q month to review for ERI -discussed generator change with pt & daughter  Paroxysmal Atrial Fibrillation  CHA2DS2-VASc 5 / 7.2% annual risk of CVA.  -OAC as below for stroke prophylaxis   Secondary Hypercoagulable State  -Eliquis 2.5mg  BID > previously reduced for wt / age -no bleeding concerns  Bilateral Carotid Artery Stenosis  -per primary Cardiology  Hypertension  -well controlled on current regimen / per primary Cardiology    Disposition:   Follow up with EP APP in 3 months but can be adjusted based  on ERI  Signed, Canary Brim, MSN, APRN, NP-C, AGACNP-BC [facility] - Electrophysiology  03/04/2023, 1:36 PM

## 2023-03-04 ENCOUNTER — Encounter: Payer: Self-pay | Admitting: Pulmonary Disease

## 2023-03-04 ENCOUNTER — Ambulatory Visit: Payer: Medicare HMO | Attending: Cardiology | Admitting: Pulmonary Disease

## 2023-03-04 ENCOUNTER — Encounter: Payer: Self-pay | Admitting: Cardiology

## 2023-03-04 VITALS — BP 130/58 | HR 85 | Ht <= 58 in | Wt 126.8 lb

## 2023-03-04 DIAGNOSIS — I480 Paroxysmal atrial fibrillation: Secondary | ICD-10-CM | POA: Diagnosis not present

## 2023-03-04 DIAGNOSIS — D6869 Other thrombophilia: Secondary | ICD-10-CM | POA: Diagnosis not present

## 2023-03-04 DIAGNOSIS — Z950 Presence of cardiac pacemaker: Secondary | ICD-10-CM

## 2023-03-04 DIAGNOSIS — R6889 Other general symptoms and signs: Secondary | ICD-10-CM | POA: Diagnosis not present

## 2023-03-04 LAB — CUP PACEART INCLINIC DEVICE CHECK
Battery Remaining Longevity: 2 mo
Battery Voltage: 2.83 V
Brady Statistic AP VP Percent: 23.3 %
Brady Statistic AP VS Percent: 0 %
Brady Statistic AS VP Percent: 76.62 %
Brady Statistic AS VS Percent: 0.08 %
Brady Statistic RA Percent Paced: 23.27 %
Brady Statistic RV Percent Paced: 99.81 %
Date Time Interrogation Session: 20241121132717
Implantable Lead Connection Status: 753985
Implantable Lead Connection Status: 753985
Implantable Lead Implant Date: 20151019
Implantable Lead Implant Date: 20151019
Implantable Lead Location: 753859
Implantable Lead Location: 753860
Implantable Lead Model: 5076
Implantable Lead Model: 5076
Implantable Pulse Generator Implant Date: 20151019
Lead Channel Impedance Value: 342 Ohm
Lead Channel Impedance Value: 342 Ohm
Lead Channel Impedance Value: 399 Ohm
Lead Channel Impedance Value: 437 Ohm
Lead Channel Pacing Threshold Amplitude: 0.625 V
Lead Channel Pacing Threshold Amplitude: 1 V
Lead Channel Pacing Threshold Pulse Width: 0.4 ms
Lead Channel Pacing Threshold Pulse Width: 0.4 ms
Lead Channel Sensing Intrinsic Amplitude: 28.375 mV
Lead Channel Sensing Intrinsic Amplitude: 28.375 mV
Lead Channel Sensing Intrinsic Amplitude: 4.25 mV
Lead Channel Sensing Intrinsic Amplitude: 4.375 mV
Lead Channel Setting Pacing Amplitude: 1.5 V
Lead Channel Setting Pacing Amplitude: 2 V
Lead Channel Setting Pacing Pulse Width: 0.4 ms
Lead Channel Setting Sensing Sensitivity: 0.9 mV
Zone Setting Status: 755011
Zone Setting Status: 755011

## 2023-03-04 NOTE — Patient Instructions (Addendum)
Medication Instructions:  Your physician recommends that you continue on your current medications as directed. Please refer to the Current Medication list given to you today.  *If you need a refill on your cardiac medications before your next appointment, please call your pharmacy*  Lab Work: None ordered.  If you have labs (blood work) drawn today and your tests are completely normal, you will receive your results only by: MyChart Message (if you have MyChart) OR A paper copy in the mail If you have any lab test that is abnormal or we need to change your treatment, we will call you to review the results.  Testing/Procedures: None ordered.  Follow-Up: At [facility], you and your health needs are our priority.  As part of our continuing mission to provide you with exceptional heart care, we have created designated Provider Care Teams.  These Care Teams include your primary Cardiologist (physician) and Advanced Practice Providers (APPs -  Physician Assistants and Nurse Practitioners) who all work together to provide you with the care you need, when you need it.  We recommend signing up for the patient portal called "MyChart".  Sign up information is provided on this After Visit Summary.  MyChart is used to connect with patients for Virtual Visits (Telemedicine).  Patients are able to view lab/test results, encounter notes, upcoming appointments, etc.  Non-urgent messages can be sent to your provider as well.   To learn more about what you can do with MyChart, go to [email or web address].    Your next appointment:   3 months  The format for your next appointment:   In Person  Provider:   Earnest Rosier, NP  Remote monitoring is used to monitor your Pacemaker/ ICD from home. This monitoring reduces the number of office visits required to check your device to one time per year. It allows [place] to keep an eye on the functioning of your device to ensure it is working properly.   Important  Information About Sugar

## 2023-03-24 ENCOUNTER — Emergency Department (HOSPITAL_COMMUNITY)
Admission: EM | Admit: 2023-03-24 | Discharge: 2023-03-24 | Disposition: A | Discharge status: Home / Self Care | Payer: Medicare HMO | Attending: Emergency Medicine | Admitting: Emergency Medicine

## 2023-03-24 ENCOUNTER — Telehealth: Payer: Self-pay | Admitting: Cardiology

## 2023-03-24 ENCOUNTER — Emergency Department (HOSPITAL_COMMUNITY): Payer: Medicare HMO

## 2023-03-24 ENCOUNTER — Encounter (HOSPITAL_COMMUNITY): Payer: Self-pay

## 2023-03-24 ENCOUNTER — Other Ambulatory Visit: Payer: Self-pay

## 2023-03-24 DIAGNOSIS — J45909 Unspecified asthma, uncomplicated: Secondary | ICD-10-CM | POA: Diagnosis not present

## 2023-03-24 DIAGNOSIS — W06XXXA Fall from bed, initial encounter: Secondary | ICD-10-CM | POA: Diagnosis not present

## 2023-03-24 DIAGNOSIS — W19XXXA Unspecified fall, initial encounter: Secondary | ICD-10-CM

## 2023-03-24 DIAGNOSIS — S81811A Laceration without foreign body, right lower leg, initial encounter: Secondary | ICD-10-CM | POA: Diagnosis not present

## 2023-03-24 DIAGNOSIS — M79604 Pain in right leg: Secondary | ICD-10-CM | POA: Diagnosis present

## 2023-03-24 DIAGNOSIS — I10 Essential (primary) hypertension: Secondary | ICD-10-CM | POA: Insufficient documentation

## 2023-03-24 DIAGNOSIS — S80811A Abrasion, right lower leg, initial encounter: Secondary | ICD-10-CM | POA: Insufficient documentation

## 2023-03-24 DIAGNOSIS — Z7982 Long term (current) use of aspirin: Secondary | ICD-10-CM | POA: Diagnosis not present

## 2023-03-24 DIAGNOSIS — Z79899 Other long term (current) drug therapy: Secondary | ICD-10-CM | POA: Insufficient documentation

## 2023-03-24 DIAGNOSIS — S0990XA Unspecified injury of head, initial encounter: Secondary | ICD-10-CM | POA: Insufficient documentation

## 2023-03-24 DIAGNOSIS — Z7901 Long term (current) use of anticoagulants: Secondary | ICD-10-CM | POA: Insufficient documentation

## 2023-03-24 DIAGNOSIS — I4891 Unspecified atrial fibrillation: Secondary | ICD-10-CM | POA: Diagnosis not present

## 2023-03-24 NOTE — Telephone Encounter (Signed)
Spoke with the patient's daughter and gave message from Dr. Odis Hollingshead.

## 2023-03-24 NOTE — ED Provider Notes (Signed)
[place] EMERGENCY DEPARTMENT AT [facility] Provider Note   CSN: [id number] Arrival date & time: 03/24/23  1048     History Chief Complaint  Patient presents with   Sarah Burns is a 89 y.o. female.  Patient with past history significant for complete AV block, asthma, allergies, hypertension here with concerns for a mechanical fall.  It is believed the patient had a mechanical fall from her bed.  Denies head strike or loss of consciousness.  Patient is on Eliquis for atrial fibrillation.  Reports that she did take her morning dose of Eliquis.  Denies any current headache, vision changes, nausea, or vomiting.  Pain is primarily to the right knee and right lower leg.   Fall       Home Medications Prior to Admission medications   Medication Sig Start Date End Date Taking? Authorizing Provider  acetaminophen (TYLENOL) 500 MG tablet Take 500 mg by mouth daily as needed for headache (pain).    [provider]  albuterol (PROVENTIL) (2.5 MG/3ML) 0.083% nebulizer solution Take 3 mLs (2.5 mg total) by nebulization every 4 (four) hours as needed for wheezing or shortness of breath. 11/26/22   Tyrone Nine, MD  amLODipine (NORVASC) 10 MG tablet Take 10 mg by mouth daily.    [provider]  apixaban (ELIQUIS) 2.5 MG TABS tablet TAKE 1 TABLET(2.5 MG) BY MOUTH TWICE DAILY 02/04/23   Tolia, Sunit, DO  ASPIRIN LOW DOSE 81 MG EC tablet TAKE 1 TABLET(81 MG) BY MOUTH DAILY. SWALLOW WHOLE 08/07/20   Yates Decamp, MD  atorvastatin (LIPITOR) 80 MG tablet Take 80 mg by mouth daily.    [provider]  BREZTRI AEROSPHERE 160-9-4.8 MCG/ACT AERO SMARTSIG:2 Puff(s) By Mouth Twice Daily 11/07/20   Martina Sinner, MD  ciclopirox [facility]) 8 % solution Apply topically at bedtime. Apply over nail and surrounding skin. Apply daily over previous coat. After seven (7) days, may remove with alcohol and continue cycle. 11/21/21   Vivi Barrack, DPM   dexamethasone (DECADRON) 0.5 MG tablet Take 0.5 mg by mouth daily.    [provider]  guaiFENesin (MUCINEX) 600 MG 12 hr tablet Take 1 tablet (600 mg total) by mouth 2 (two) times daily as needed. Patient not taking: Reported on 03/04/2023 07/29/20   Burnadette Pop, MD  Lactase (LACTAID PO) Take 1 tablet by mouth as needed (when consuming dairy).    [provider]  losartan (COZAAR) 50 MG tablet Take 50 mg by mouth daily.    [provider]  montelukast (SINGULAIR) 10 MG tablet Take 10 mg by mouth at bedtime. 01/16/20   [provider]  OVER THE COUNTER MEDICATION Take 1 tablet by mouth daily. Super beets    [provider]      Allergies    Lactose intolerance (gi) and Penicillins    Review of Systems   Review of Systems  Musculoskeletal:        Leg pain  All other systems reviewed and are negative.   Physical Exam Updated Vital Signs BP (!) 144/49   Pulse 71   Temp 98.2 F (36.8 C) (Oral)   Resp 16   Ht 4\' 3"  (1.295 m)   Wt 57.5 kg   SpO2 95%   BMI 34.27 kg/m  Physical Exam Vitals and nursing note reviewed.  Constitutional:      General: She is not in acute distress.    Appearance: She is well-developed.  HENT:     Head: Normocephalic and atraumatic.  Eyes:     Conjunctiva/sclera: Conjunctivae normal.  Cardiovascular:     Rate and Rhythm: Normal rate and regular rhythm.     Heart sounds: No murmur heard. Pulmonary:     Effort: Pulmonary effort is normal. No respiratory distress.     Breath sounds: Normal breath sounds.  Abdominal:     Palpations: Abdomen is soft.     Tenderness: There is no abdominal tenderness.  Musculoskeletal:        General: Tenderness present. No swelling, deformity or signs of injury. Normal range of motion.     Cervical back: Neck supple.     Comments: Patient has a superficial abrasion to the right knee with no active bleeding.  Right lower leg in the anterior shin appears to have a chronic  appearing wound although unsure if this is an acute injury or not.  Skin:    General: Skin is warm and dry.     Capillary Refill: Capillary refill takes less than 2 seconds.  Neurological:     Mental Status: She is alert.  Psychiatric:        Mood and Affect: Mood normal.     ED Results / Procedures / Treatments   Labs (all labs ordered are listed, but only abnormal results are displayed) Labs Reviewed - No data to display  EKG None  Radiology DG Tibia/Fibula Right  Result Date: 03/24/2023 CLINICAL DATA:  Right lower leg laceration after fall. EXAM: RIGHT TIBIA AND FIBULA - 2 VIEW COMPARISON:  None Available. FINDINGS: There is no evidence of fracture or other focal bone lesions. Soft tissues are unremarkable. IMPRESSION: Negative. Electronically Signed   By: Lupita Raider M.D.   On: 03/24/2023 12:33   CT Head Wo Contrast  Result Date: 03/24/2023 CLINICAL DATA:  Head trauma, minor (Age >= 65y) Fall on thinners EXAM: CT HEAD WITHOUT CONTRAST TECHNIQUE: Contiguous axial images were obtained from the base of the skull through the vertex without intravenous contrast. RADIATION DOSE REDUCTION: This exam was performed according to the departmental dose-optimization program which includes automated exposure control, adjustment of the mA and/or kV according to patient size and/or use of iterative reconstruction technique. COMPARISON:  CTA head neck 10/23/20 FINDINGS: Brain: No hemorrhage. No hydrocephalus. No extra-axial fluid collection. No CT evidence of acute cortical infarct. Mass effect. No mass lesion. Generalized volume loss without lobar predominance. Vascular: No hyperdense vessel or unexpected calcification. Skull: Normal. Negative for fracture or focal lesion. Sinuses/Orbits: No middle ear or mastoid effusion. Mucosal thickening bilateral maxillary and ethmoid sinuses. Bilateral lens replacement and scleral bands. Orbits are otherwise unremarkable. Other: None. IMPRESSION: No CT  evidence of intracranial injury. Electronically Signed   By: Lorenza Cambridge M.D.   On: 03/24/2023 12:02    Procedures Procedures   Medications Ordered in ED Medications - No data to display  ED Course/ Medical Decision Making/ A&P                               Medical Decision Making Amount and/or Complexity of Data Reviewed Radiology: ordered.   This patient presents to the ED for concern of fall.  Differential diagnosis includes patellar dislocation, tibial plateau fracture, SAH, concussion, syncope   Imaging Studies ordered:  I ordered imaging studies including CT head, x-ray right tibia/fibula. I independently visualized and interpreted imaging which showed CT head unremarkable, xray of right tibia/fibula  includes knee and no acute findings I agree with the radiologist interpretation   Problem List / ED Course:  Patient presents to the emergency department with past history significant for atrial fibrillation, complete AV block, asthma, allergies, hypertension here with concerns of mechanical fall.  Patient reportedly fell out of her bed and struck her right leg.  Complaining of pain to the right knee and right lower leg.  Denies head strike, syncope, nausea, vomiting, or vision changes.  Patient is on Eliquis and family is concerned for possible head injury.  Given use of Eliquis for atrial fibrillation, will obtain CT imaging to rule out possible head bleed.  Primary concern is for possible bony injury in the right leg.  Patient with no significant pain on physical examination but will obtain imaging.  Patient is typically weightbearing with assistance of a cane.  Patient in no acute pain at this time so no medications given for pain control.  Will reassess patient shortly after imaging is obtained. CT imaging and xray of right leg are unremarkable. Suspect minor superficial abrasion to the right leg. No other acute or focal findings. CT imaging negative for any head trauma or  bleeding. Patient otherwise remaining stable and will be discharged home with instructions to follow up with PCP for further evaluation. Discharged home in stable condition.  Final Clinical Impression(s) / ED Diagnoses Final diagnoses:  Fall, initial encounter  Abrasion of right lower extremity, initial encounter    Rx / DC Orders ED Discharge Orders     None         Salomon Mast 03/24/23 1253    Gerhard Munch, MD 03/24/23 1624

## 2023-03-24 NOTE — Telephone Encounter (Signed)
So sorry to hear that.  Glad they went to hospital given her fall as she is on blood thinner.  Please let them know if cardiology is requested by attending doctor someone from the team will see her.   Johannes Everage [place], DO, [facility]

## 2023-03-24 NOTE — Discharge Instructions (Signed)
You were seen in the ER today for a fall. Your xray and CT imaging were reassuring. Your abrasion to the right leg was dressed here in the ER. I would recommend monitoring for any signs of infection over the next few days. Please plan on following up with your primary care provider for further evaluation. Return to the ER if symptoms are worsening.

## 2023-03-24 NOTE — ED Triage Notes (Signed)
Pt reports mechanical fall from bed denies loc pt denies hitting head. Family thinks there is a possibility that she did hit her head. Pt is on blood thinners. Pt not in neck brace. Pt has an abrasion to right shin.   Bp 156/68 Hr 82 97% on ra  Rr 14.

## 2023-03-24 NOTE — ED Notes (Signed)
Pt verbalized understanding of discharge instructions. Pt daughter was called and also verbalized understanding of discharge instructions. Pt ambulated with a cain to the bathroom then wheeled in wheelchair out of ed.

## 2023-03-24 NOTE — Telephone Encounter (Signed)
Pt daughter called to inform Dr. Odis Hollingshead that pt fell this morning and she is in the hospital

## 2023-03-30 DIAGNOSIS — I119 Hypertensive heart disease without heart failure: Secondary | ICD-10-CM | POA: Diagnosis not present

## 2023-03-30 DIAGNOSIS — I739 Peripheral vascular disease, unspecified: Secondary | ICD-10-CM | POA: Diagnosis not present

## 2023-03-30 DIAGNOSIS — L0390 Cellulitis, unspecified: Secondary | ICD-10-CM | POA: Diagnosis not present

## 2023-04-16 ENCOUNTER — Ambulatory Visit: Payer: Medicare HMO | Admitting: Cardiology

## 2023-05-03 ENCOUNTER — Ambulatory Visit: Payer: Medicare HMO | Attending: Cardiology | Admitting: Cardiology

## 2023-05-03 ENCOUNTER — Encounter: Payer: Self-pay | Admitting: Cardiology

## 2023-05-03 VITALS — BP 120/62 | HR 69 | Resp 16 | Ht <= 58 in | Wt 123.8 lb

## 2023-05-03 DIAGNOSIS — I480 Paroxysmal atrial fibrillation: Secondary | ICD-10-CM | POA: Diagnosis not present

## 2023-05-03 DIAGNOSIS — Z87891 Personal history of nicotine dependence: Secondary | ICD-10-CM | POA: Diagnosis not present

## 2023-05-03 DIAGNOSIS — Z7901 Long term (current) use of anticoagulants: Secondary | ICD-10-CM | POA: Diagnosis not present

## 2023-05-03 DIAGNOSIS — D6869 Other thrombophilia: Secondary | ICD-10-CM | POA: Diagnosis not present

## 2023-05-03 DIAGNOSIS — I6523 Occlusion and stenosis of bilateral carotid arteries: Secondary | ICD-10-CM | POA: Diagnosis not present

## 2023-05-03 DIAGNOSIS — Z950 Presence of cardiac pacemaker: Secondary | ICD-10-CM | POA: Diagnosis not present

## 2023-05-03 DIAGNOSIS — I10 Essential (primary) hypertension: Secondary | ICD-10-CM | POA: Diagnosis not present

## 2023-05-03 DIAGNOSIS — I700 Atherosclerosis of aorta: Secondary | ICD-10-CM | POA: Diagnosis not present

## 2023-05-03 NOTE — Patient Instructions (Addendum)
Medication Instructions:  Your physician has recommended you make the following change in your medication: Stop Aspirin    *If you need a refill on your cardiac medications before your next appointment, please call your pharmacy*  Lab Work: None ordered today. If you have labs (blood work) drawn today and your tests are completely normal, you will receive your results only by: MyChart Message (if you have MyChart) OR A paper copy in the mail If you have any lab test that is abnormal or we need to change your treatment, we will call you to review the results.  Testing/Procedures: Carotid duplex bilateral   Your physician has requested that you have a carotid duplex. This test is an ultrasound of the carotid arteries in your neck. It looks at blood flow through these arteries that supply the brain with blood. Allow one hour for this exam. There are no restrictions or special instructions.   Follow-Up: At [facility], you and your health needs are our priority.  As part of our continuing mission to provide you with exceptional heart care, we have created designated Provider Care Teams.  These Care Teams include your primary Cardiologist (physician) and Advanced Practice Providers (APPs -  Physician Assistants and Nurse Practitioners) who all work together to provide you with the care you need, when you need it.  We recommend signing up for the patient portal called "MyChart".  Sign up information is provided on this After Visit Summary.  MyChart is used to connect with patients for Virtual Visits (Telemedicine).  Patients are able to view lab/test results, encounter notes, upcoming appointments, etc.  Non-urgent messages can be sent to your provider as well.   To learn more about what you can do with MyChart, go to [email or web address].    Your next appointment:   6 month(s)  The format for your next appointment:   In Person  Provider:   Tessa Lerner, DO {

## 2023-05-03 NOTE — Progress Notes (Signed)
Cardiology Office Note:  .   Date:  05/03/2023  ID:  Sarah Burns, DOB 12-05-1924, MRN [id number] PCP:  Renaye Rakers, MD  Former Cardiology Providers: Dr. Zenon Mayo at [facility] Providers Cardiologist:  Tessa Lerner, DO, [facility] (established care 03/11/2020)  Electrophysiologist:  Maurice Small, MD  Electrophysiologist:  Maurice Small, MD  Click to update primary MD,subspecialty MD or APP then REFRESH:1}    Chief Complaint  Patient presents with   Paroxysmal atrial fibrillation    Follow-up    History of Present Illness: .   Sarah Burns is a 89 y.o. African-American female whose past medical history and cardiovascular risk factors includes: Paroxysmal atrial fibrillation, presence of a permanent pacemaker, hypertension, hypercholesterolemia, former smoker, bilateral asymptomatic carotid artery disease, postmenopausal female, advanced age.   Patient was referred to the office back in 2021 to establish care given history of paroxysmal atrial fibrillation, pacemaker implant, and carotid artery disease.  Patient is accompanied by her daughter Sarah Burns at today's office visit.  Paroxysmal atrial fibrillation: Has not been experiencing palpitations like she was in the past. Ventricular rate is well-controlled on current medical management. He does not endorse evidence of bleeding. In December she had a mechanical fall and did go to the ER to be evaluated as she is on blood thinners.  She is not taking precautionary steps to avoid falls and would like to continue on anticoagulation to minimize her risk of stroke.  Pacemaker implant: Patient is currently being monitored closely by device clinic and given the fact that she is reaching end-of-life battery.  Carotid artery disease: Remains asymptomatic. Has undergone CTA head and neck in the past no recent carotid duplex available for review.  Review of Systems: .   Review of Systems   Cardiovascular:  Negative for chest pain, claudication, irregular heartbeat, leg swelling, near-syncope, orthopnea, palpitations, paroxysmal nocturnal dyspnea and syncope.  Respiratory:  Negative for shortness of breath.   Hematologic/Lymphatic: Negative for bleeding problem.  Musculoskeletal:  Positive for falls.    Studies Reviewed:   EKG: EKG Interpretation Date/Time:  Monday May 03 2023 14:33:10 EST Ventricular Rate:  71 PR Interval:  174 QRS Duration:  162 QT Interval:  466 QTC Calculation: 506 R Axis:   -87  Text Interpretation: Atrial-sensed ventricular-paced rhythm When compared with ECG of 04-Mar-2023 10:47, Vent. rate has decreased BY  14 BPM Confirmed by Tessa Lerner [phone]) on 05/03/2023 2:46:11 PM  Echocardiogram: 07/28/2020: LVEF 65 to 70%, mild LVH, diastolic parameters indeterminate, moderately dilated left atrium, moderately dilated right atrium, aortic valve sclerosis with mild stenosis.  06/10/2022:  Normal LV systolic function with visual EF 60-65%. Left ventricle cavity is normal in size. Normal global wall motion. Mild left ventricular  hypertrophy. Doppler evidence of grade II (pseudonormal) diastolic dysfunction, elevated LAP.  Left atrial cavity is mildly dilated at 37.47 ml/m^2.  Aortic valve sclerosis without stenosis.  The aortic root and proximal ascending aorta not well visualized.  Compared to 07/28/2020 G2DD and elevated LAP are new findings, mild AS is not seen on current study.   Carotid artery duplex 04/22/2022:  Duplex suggests stenosis in the right internal carotid artery (16-49%). Insignificant stenosis in the right external carotid artery.  Duplex suggests stenosis in the left internal carotid artery (50-69%). Significant stenosis in the left external carotid artery >50%.  Antegrade right vertebral artery flow. Antegrade left vertebral artery flow.  Compared to the study done on 09/11/2020, there is mild progression of  the disease on the  right from ICA stenosis of >50% and on the left also from ICA stenosis of >70%. Follow up in six months is appropriate if clinically indicated.   RADIOLOGY: CTA head and neck with and without contrast: 10/24/2020 1. No acute intracranial abnormality.  Normal for age. 2. Less than 50% diameter stenosis proximal right internal carotid artery and moderate to severe stenosis origin of right external carotid artery. Moderate stenosis right cavernous carotid due to calcific disease. 3. 50% diameter stenosis distal left common carotid artery and 60% diameter stenosis proximal left internal carotid artery. Moderate to severe stenosis origin of left external carotid artery. Moderate stenosis left cavernous carotid due to calcific disease. 4. Both vertebral arteries widely patent 5. No intracranial large vessel occlusion or flow limiting stenosis.  Risk Assessment/Calculations:   Click Here to Calculate/Change CHADS2VASc Score The patient's CHADS2-VASc score is 5, indicating a 7.2% annual risk of stroke. CHF History: No HTN History: Yes Diabetes History: No Stroke History: No Vascular Disease History: Yes    Labs:       Latest Ref Rng & Units 11/25/2022    3:07 AM 09/30/2020   10:22 AM 07/29/2020    1:11 AM  CBC  WBC 4.0 - 10.5 K/uL 4.5   12.7   Hemoglobin 12.0 - 15.0 g/dL 88.4  16.6  9.1   Hematocrit 36.0 - 46.0 % 30.0  34.2  27.5   Platelets 150 - 400 K/uL 272   212        Latest Ref Rng & Units 11/26/2022   12:56 AM 11/25/2022    2:41 PM 11/25/2022    3:07 AM  BMP  Glucose 70 - 99 mg/dL 063  88  95   BUN 8 - 23 mg/dL 15  15  16    Creatinine 0.44 - 1.00 mg/dL 0.16  0.10  9.32   Sodium 135 - 145 mmol/L 127  125  124   Potassium 3.5 - 5.1 mmol/L 3.8  4.0  4.0   Chloride 98 - 111 mmol/L 97  94  91   CO2 22 - 32 mmol/L 22  19  20    Calcium 8.9 - 10.3 mg/dL 8.4  8.5  8.9       Latest Ref Rng & Units 11/26/2022   12:56 AM 11/25/2022    2:41 PM 11/25/2022    3:07 AM  CMP  Glucose 70 -  99 mg/dL 355  88  95   BUN 8 - 23 mg/dL 15  15  16    Creatinine 0.44 - 1.00 mg/dL 7.32  2.02  5.42   Sodium 135 - 145 mmol/L 127  125  124   Potassium 3.5 - 5.1 mmol/L 3.8  4.0  4.0   Chloride 98 - 111 mmol/L 97  94  91   CO2 22 - 32 mmol/L 22  19  20    Calcium 8.9 - 10.3 mg/dL 8.4  8.5  8.9     Lab Results  Component Value Date   CHOL 166 05/04/2022   HDL 82 05/04/2022   LDLCALC 71 05/04/2022   LDLDIRECT 74 05/04/2022   TRIG 65 05/04/2022   No results for input(s): "LIPOA" in the last 8760 hours. No components found for: "NTPROBNP" No results for input(s): "PROBNP" in the last 8760 hours. No results for input(s): "TSH" in the last 8760 hours.   Physical Exam:    Today's Vitals   05/03/23 1429  BP: 120/62  Pulse: 69  Resp:  16  SpO2: 95%  Weight: 123 lb 12.8 oz (56.2 kg)  Height: 4\' 3"  (1.295 m)   Body mass index is 33.46 kg/m. Wt Readings from Last 3 Encounters:  05/03/23 123 lb 12.8 oz (56.2 kg)  03/24/23 126 lb 12.2 oz (57.5 kg)  03/04/23 126 lb 12.8 oz (57.5 kg)    Physical Exam  Constitutional: No distress. She appears chronically ill.  Hemodynamically stable.   Neck: No JVD present.  Cardiovascular: Regular rhythm, S1 normal, S2 normal, intact distal pulses and normal pulses. Exam reveals no gallop, no S3 and no S4.  No murmur heard. Pulses:      Carotid pulses are  on the right side with bruit. Pulmonary/Chest: Effort normal and breath sounds normal. No stridor. She has no wheezes. She has no rales.  Pacemaker located left infraclavicular region  Abdominal: Soft. Bowel sounds are normal. She exhibits no distension. There is no abdominal tenderness.  Musculoskeletal:        General: No edema.     Cervical back: Neck supple.  Neurological: She is alert and oriented to person, place, and time. She has intact cranial nerves (2-12).  Skin: Skin is warm and moist.     Impression & Recommendation(s):  Impression:   ICD-10-CM   1. Paroxysmal atrial  fibrillation (HCC)  I48.0 EKG 12-Lead    2. Secondary hypercoagulable state (HCC)  D68.69     3. Long term (current) use of anticoagulants  Z79.01     4. Pacemaker Dual chamber Medtronic Advisa DR MRI SureScan 01/29/2014  Z95.0     5. Essential hypertension, benign  I10     6. Atherosclerosis of aorta (HCC)  I70.0     7. Bilateral carotid artery stenosis  I65.23 VAS US CAROTID    8. Former smoker  Z87.891        Recommendation(s):  Paroxysmal atrial fibrillation (HCC) Currently not on AV nodal blocking agents. Used to be on Toprol-XL 25 mg p.o. daily, unsure why the medication was discontinued. Antiarrhythmic medications: N/A Thromboembolic prophylaxis: Eliquis EKG today illustrates atrial sensed ventricular paced rhythm No hospitalizations for A-fib with RVR since last office encounter a year ago Monitor for now  Secondary hypercoagulable state (HCC) Long term (current) use of anticoagulants Does not endorse evidence of bleeding. Had 1 episode of mechanical fall since last office visit and did go to the ED to be evaluated. We discussed the risks, benefits, and alternatives to anticoagulation and patient would like to proceed with continuation of anticoagulation to minimize her risk of stroke. Currently on Eliquis 2.5 mg p.o. twice daily-age greater than 80, and weight is 56 kg.  Pacemaker Dual chamber Medtronic Advisa DR MRI SureScan 01/29/2014 Implanted in October 2015 due to complete heart block Follows with pacemaker clinic. Reaching end-of-life battery, being followed closely with device clinic. Device clinic has been notified to reevaluate her device. Will defer to EP team for now  Essential hypertension, benign Office blood pressures are very well-controlled. Continue amlodipine 10 mg p.o. daily. Continue losartan 50 mg p.o. daily  Atherosclerosis of aorta (HCC) Bilateral carotid artery stenosis Currently asymptomatic. To minimize risk of bleeds will  discontinue aspirin 81 mg p.o. daily as she is on anticoagulation. Given her advanced age we discussed either foregoing additional carotid duplex going forward if that is in line with her goals of care or to continue surveillance.  For now patient would like to continue surveillance.  Orders Placed:  Orders Placed This Encounter  Procedures  EKG 12-Lead    Final Medication List:   No orders of the defined types were placed in this encounter.   Medications Discontinued During This Encounter  Medication Reason   ASPIRIN LOW DOSE 81 MG EC tablet Discontinued by provider     Current Outpatient Medications:    acetaminophen (TYLENOL) 500 MG tablet, Take 500 mg by mouth daily as needed for headache (pain)., Disp: , Rfl:    albuterol (PROVENTIL) (2.5 MG/3ML) 0.083% nebulizer solution, Take 3 mLs (2.5 mg total) by nebulization every 4 (four) hours as needed for wheezing or shortness of breath., Disp: 90 mL, Rfl: 0   amLODipine (NORVASC) 10 MG tablet, Take 10 mg by mouth daily., Disp: , Rfl:    apixaban (ELIQUIS) 2.5 MG TABS tablet, TAKE 1 TABLET(2.5 MG) BY MOUTH TWICE DAILY, Disp: 180 tablet, Rfl: 1   atorvastatin (LIPITOR) 80 MG tablet, Take 80 mg by mouth daily., Disp: , Rfl:    BREZTRI AEROSPHERE 160-9-4.8 MCG/ACT AERO, SMARTSIG:2 Puff(s) By Mouth Twice Daily, Disp: 10.7 g, Rfl: 5   ciclopirox (PENLAC) 8 % solution, Apply topically at bedtime. Apply over nail and surrounding skin. Apply daily over previous coat. After seven (7) days, may remove with alcohol and continue cycle., Disp: 6.6 mL, Rfl: 2   dexamethasone (DECADRON) 0.5 MG tablet, Take 0.5 mg by mouth daily., Disp: , Rfl:    guaiFENesin (MUCINEX) 600 MG 12 hr tablet, Take 1 tablet (600 mg total) by mouth 2 (two) times daily as needed., Disp: 14 tablet, Rfl: 0   Lactase (LACTAID PO), Take 1 tablet by mouth as needed (when consuming dairy)., Disp: , Rfl:    losartan (COZAAR) 50 MG tablet, Take 50 mg by mouth daily., Disp: , Rfl:     montelukast (SINGULAIR) 10 MG tablet, Take 10 mg by mouth at bedtime., Disp: , Rfl:    OVER THE COUNTER MEDICATION, Take 1 tablet by mouth daily. Super beets, Disp: , Rfl:   Consent:   N/A  Disposition:   6 months follow-up sooner if needed  Her questions and concerns were addressed to her satisfaction. She voices understanding of the recommendations provided during this encounter.    Signed, Tessa Lerner, DO, [facility]  [facility]  [address] [place], [place] [address] 05/03/2023 9:50 PM

## 2023-05-06 ENCOUNTER — Ambulatory Visit: Payer: Medicare HMO

## 2023-05-06 ENCOUNTER — Other Ambulatory Visit: Payer: Self-pay | Admitting: Cardiology

## 2023-05-06 DIAGNOSIS — Z7901 Long term (current) use of anticoagulants: Secondary | ICD-10-CM

## 2023-05-06 NOTE — Telephone Encounter (Signed)
Prescription refill request for Eliquis received. Indication: PAF Last office visit:  05/03/23  Emelda Brothers MD Scr: 1.05 on 11/26/22  Epic Age: 89 Weight: 56.2kg  Based on above findings Eliquis 2.5mg  twice daily is the appropriate dose.  Refill approved.

## 2023-05-08 LAB — CUP PACEART REMOTE DEVICE CHECK
Battery Remaining Longevity: 2 mo
Battery Voltage: 2.82 V
Brady Statistic AP VP Percent: 25.39 %
Brady Statistic AP VS Percent: 0 %
Brady Statistic AS VP Percent: 74.59 %
Brady Statistic AS VS Percent: 0.02 %
Brady Statistic RA Percent Paced: 25.37 %
Brady Statistic RV Percent Paced: 99.96 %
Date Time Interrogation Session: 20250124163719
Implantable Lead Connection Status: 753985
Implantable Lead Connection Status: 753985
Implantable Lead Implant Date: 20151019
Implantable Lead Implant Date: 20151019
Implantable Lead Location: 753859
Implantable Lead Location: 753860
Implantable Lead Model: 5076
Implantable Lead Model: 5076
Implantable Pulse Generator Implant Date: 20151019
Lead Channel Impedance Value: 304 Ohm
Lead Channel Impedance Value: 323 Ohm
Lead Channel Impedance Value: 380 Ohm
Lead Channel Impedance Value: 399 Ohm
Lead Channel Pacing Threshold Amplitude: 0.625 V
Lead Channel Pacing Threshold Amplitude: 0.875 V
Lead Channel Pacing Threshold Pulse Width: 0.4 ms
Lead Channel Pacing Threshold Pulse Width: 0.4 ms
Lead Channel Sensing Intrinsic Amplitude: 29.375 mV
Lead Channel Sensing Intrinsic Amplitude: 29.375 mV
Lead Channel Sensing Intrinsic Amplitude: 3.75 mV
Lead Channel Sensing Intrinsic Amplitude: 3.75 mV
Lead Channel Setting Pacing Amplitude: 1.5 V
Lead Channel Setting Pacing Amplitude: 2 V
Lead Channel Setting Pacing Pulse Width: 0.4 ms
Lead Channel Setting Sensing Sensitivity: 0.9 mV
Zone Setting Status: 755011
Zone Setting Status: 755011

## 2023-05-10 ENCOUNTER — Telehealth: Payer: Self-pay

## 2023-05-10 NOTE — Telephone Encounter (Signed)
Alert received from [employer] for pacemaker @ [facility] 03/24/23. Pt apt moved up to 05/18/23 with Merry Proud, NP to discuss gen change. Pt daughter Stanton Kidney aware and agreeable.

## 2023-05-11 ENCOUNTER — Telehealth: Payer: Self-pay | Admitting: Cardiology

## 2023-05-11 ENCOUNTER — Other Ambulatory Visit: Payer: Self-pay

## 2023-05-11 DIAGNOSIS — Z7901 Long term (current) use of anticoagulants: Secondary | ICD-10-CM

## 2023-05-11 MED ORDER — APIXABAN 2.5 MG PO TABS: 2.5000 mg | ORAL_TABLET | Freq: Two times a day (BID) | ORAL | 0 refills | Status: DC

## 2023-05-11 NOTE — Telephone Encounter (Signed)
No, she was taking Eliquis once a day at the last office visit. I recommended to them that she needs to be on Eliquis 2.5mg  po BID. Marcelline Deist is once a day.   Jakyron Fabro Godley, DO, [facility]

## 2023-05-11 NOTE — Telephone Encounter (Signed)
Pt c/o medication issue:  1. Name of Medication: ELIQUIS 2.5 MG TABS tablet    2. How are you currently taking this medication (dosage and times per day)?  TAKE 1 TABLET BY MOUTH TWICE DAILY  3. Are you having a reaction (difficulty breathing--STAT)? No   4. What is your medication issue? Patient and Dr. Odis Hollingshead talked last week about about changing from Eliquis to Farxiga   Please send prescription of Farxiga to  [id number] - [place], [place] - [address] AT [facility] OF [address] & [address]

## 2023-05-11 NOTE — Progress Notes (Unsigned)
Samples for Eliquis 2.5 mg of a 2 week supply given to pt as well as a 30 day trial card for the pt to try and use put at front desk for daughter to pick up. PA forms also placed at front desk. Daughter is aware.

## 2023-05-12 MED ORDER — APIXABAN 2.5 MG PO TABS
2.5000 mg | ORAL_TABLET | Freq: Two times a day (BID) | ORAL | 1 refills | Status: DC
Start: 2023-05-12 — End: 2023-05-18

## 2023-05-12 NOTE — Telephone Encounter (Signed)
Called and spoke with pt. I explained to pt about Eliquis and the importance of taking Eliquis 2.5mg  twice daily. Pt verbalized understanding and states she is going to pick up her samples today and requested a 90 day supply be sent to pharmacy. Refill sent.   Prescription refill request for Eliquis received. Indication: Afib  Last office visit: 05/03/23 Odis Hollingshead)  Scr: 1.05 (11/26/22)  Age: 89 Weight: 56.2kg  Appropriate dose.

## 2023-05-14 ENCOUNTER — Ambulatory Visit (HOSPITAL_COMMUNITY)
Admission: RE | Admit: 2023-05-14 | Discharge: 2023-05-14 | Disposition: A | Discharge status: Home / Self Care | Payer: Medicare HMO | Source: Ambulatory Visit | Attending: Cardiology | Admitting: Cardiology

## 2023-05-14 DIAGNOSIS — I6523 Occlusion and stenosis of bilateral carotid arteries: Secondary | ICD-10-CM | POA: Diagnosis not present

## 2023-05-18 ENCOUNTER — Encounter: Payer: Self-pay | Admitting: *Deleted

## 2023-05-18 ENCOUNTER — Ambulatory Visit: Payer: Medicare HMO | Attending: Pulmonary Disease | Admitting: Pulmonary Disease

## 2023-05-18 ENCOUNTER — Encounter: Payer: Self-pay | Admitting: Pulmonary Disease

## 2023-05-18 VITALS — BP 142/56 | HR 73 | Ht <= 58 in | Wt 127.4 lb

## 2023-05-18 DIAGNOSIS — I480 Paroxysmal atrial fibrillation: Secondary | ICD-10-CM

## 2023-05-18 DIAGNOSIS — I10 Essential (primary) hypertension: Secondary | ICD-10-CM

## 2023-05-18 DIAGNOSIS — I442 Atrioventricular block, complete: Secondary | ICD-10-CM | POA: Diagnosis not present

## 2023-05-18 DIAGNOSIS — D6869 Other thrombophilia: Secondary | ICD-10-CM

## 2023-05-18 DIAGNOSIS — Z950 Presence of cardiac pacemaker: Secondary | ICD-10-CM

## 2023-05-18 LAB — CUP PACEART INCLINIC DEVICE CHECK
Battery Remaining Longevity: 2 mo
Battery Voltage: 2.82 V
Brady Statistic AP VP Percent: 27.71 %
Brady Statistic AP VS Percent: 0 %
Brady Statistic AS VP Percent: 72.27 %
Brady Statistic AS VS Percent: 0.02 %
Brady Statistic RA Percent Paced: 27.69 %
Brady Statistic RV Percent Paced: 99.96 %
Date Time Interrogation Session: 20250204173357
Implantable Lead Connection Status: 753985
Implantable Lead Connection Status: 753985
Implantable Lead Implant Date: 20151019
Implantable Lead Implant Date: 20151019
Implantable Lead Location: 753859
Implantable Lead Location: 753860
Implantable Lead Model: 5076
Implantable Lead Model: 5076
Implantable Pulse Generator Implant Date: 20151019
Lead Channel Impedance Value: 323 Ohm
Lead Channel Impedance Value: 342 Ohm
Lead Channel Impedance Value: 380 Ohm
Lead Channel Impedance Value: 437 Ohm
Lead Channel Pacing Threshold Amplitude: 0.5 V
Lead Channel Pacing Threshold Amplitude: 1 V
Lead Channel Pacing Threshold Pulse Width: 0.4 ms
Lead Channel Pacing Threshold Pulse Width: 0.4 ms
Lead Channel Sensing Intrinsic Amplitude: 29.375 mV
Lead Channel Sensing Intrinsic Amplitude: 29.375 mV
Lead Channel Sensing Intrinsic Amplitude: 4.25 mV
Lead Channel Sensing Intrinsic Amplitude: 4.25 mV
Lead Channel Setting Pacing Amplitude: 1.5 V
Lead Channel Setting Pacing Amplitude: 2 V
Lead Channel Setting Pacing Pulse Width: 0.4 ms
Lead Channel Setting Sensing Sensitivity: 0.9 mV
Zone Setting Status: 755011
Zone Setting Status: 755011

## 2023-05-18 NOTE — Progress Notes (Signed)
  Electrophysiology Office Note:   Date:  05/18/2023  ID:  Sarah Burns, Sarah Burns 03-09-1925, MRN [id number]  Primary Cardiologist: Madonna Large, DO Primary Heart Failure: None Electrophysiologist: Eulas FORBES Furbish, MD       History of Present Illness:   Sarah Burns is a 89 y.o. female with h/o CHB s/p PPM, paroxysmal AF, HTN, HLD, anemia  seen today for routine electrophysiology followup.   She met [facility] as of 03/24/23.   Since last being seen in our clinic the patient reports doing well overall. No device specific concerns.    She denies chest pain, palpitations, dyspnea, PND, orthopnea, nausea, vomiting, dizziness, syncope, edema, weight gain, or early satiety.   Review of systems complete and found to be negative unless listed in HPI.   EP Information / Studies Reviewed:    EKG is not ordered today. EKG from 05/03/23 reviewed which showed ASVP 71 bpm      PPM Interrogation-  reviewed in detail today,  See PACEART report.  Device History: Medtronic Dual Chamber PPM implanted 01/29/2014 for CHB Dependent, no R waves at 30 05/18/23   Studies:  ECHO (PCV) 05/2012 > LVEF 60-65%, normal LV size, GII DD   Arrhythmia / AAD CHB s/p PPM   Paroxysmal AF   Risk Assessment/Calculations:    CHA2DS2-VASc Score = 5   This indicates a 7.2% annual risk of stroke. The patient's score is based upon: CHF History: 0 HTN History: 1 Diabetes History: 0 Stroke History: 0 Vascular Disease History: 1 Age Score: 2 Gender Score: 1         Physical Exam:   VS:  BP (!) 142/56   Pulse 73   Ht 4' 3 (1.295 m)   Wt 127 lb 6.4 oz (57.8 kg)   SpO2 97%   BMI 34.44 kg/m    Wt Readings from Last 3 Encounters:  05/18/23 127 lb 6.4 oz (57.8 kg)  05/03/23 123 lb 12.8 oz (56.2 kg)  03/24/23 126 lb 12.2 oz (57.5 kg)     GEN: Well nourished, well developed in no acute distress NECK: No JVD; No carotid bruits CARDIAC: Regular rate and rhythm, no murmurs, rubs, gallops RESPIRATORY:  Clear to  auscultation without rales, wheezing or rhonchi  ABDOMEN: Soft, non-tender, non-distended EXTREMITIES:  No edema; No deformity   ASSESSMENT AND PLAN:    CHB s/p Medtronic PPM  -Normal PPM function -See Pace Art report -pt dependant / no R waves at 30 bpm -No changes today -at [facility] as of 03/24/23, planned for generator change on 06/01/23  -pre-procedure labs > CBC, BMP -procedure discussed with patient & daughter, instructions provided    Paroxysmal AF  CHA2DS2-VASc 5 / 7.2% annual risk of CVA  -OAC for stroke prophylaxis   Secondary Hypercoagulable State  -continue Eliquis  2.5mg  BID, previously reduced for wt / age   Hypertension  -mildly elevated in clinic, monitor for now     Disposition:   Follow up with Dr. Furbish  as planned for generator change   Signed, Daphne Barrack, NP-C, AGACNP-BC [facility] - Electrophysiology  05/18/2023, 5:31 PM

## 2023-05-18 NOTE — Patient Instructions (Addendum)
Medication Instructions:  Your physician recommends that you continue on your current medications as directed. Please refer to the Current Medication list given to you today.  *If you need a refill on your cardiac medications before your next appointment, please call your pharmacy*  Lab Work: BMET, CBC-TODAY If you have labs (blood work) drawn today and your tests are completely normal, you will receive your results only by: MyChart Message (if you have MyChart) OR A paper copy in the mail If you have any lab test that is abnormal or we need to change your treatment, we will call you to review the results.   Testing/Procedures: See letter   Follow-Up: At [facility], you and your health needs are our priority.  As part of our continuing mission to provide you with exceptional heart care, we have created designated Provider Care Teams.  These Care Teams include your primary Cardiologist (physician) and Advanced Practice Providers (APPs -  Physician Assistants and Nurse Practitioners) who all work together to provide you with the care you need, when you need it.  We recommend signing up for the patient portal called "MyChart".  Sign up information is provided on this After Visit Summary.  MyChart is used to connect with patients for Virtual Visits (Telemedicine).  Patients are able to view lab/test results, encounter notes, upcoming appointments, etc.  Non-urgent messages can be sent to your provider as well.   To learn more about what you can do with MyChart, go to [email or web address].    Your next appointment:   Follow up appointments will be arranged for you and print out on your discharge summary after your procedure.

## 2023-05-18 NOTE — H&P (View-Only) (Signed)
  Electrophysiology Office Note:   Date:  05/18/2023  ID:  Sarah Burns, [place] 02-12-1925, MRN [id number]  Primary Cardiologist: Tessa Lerner, DO Primary Heart Failure: None Electrophysiologist: Maurice Small, MD       History of Present Illness:   Sarah Burns is a 89 y.o. female with h/o CHB s/p PPM, paroxysmal AF, HTN, HLD, anemia  seen today for routine electrophysiology followup.   She met RRT as of 03/24/23.   Since last being seen in our clinic the patient reports doing well overall. No device specific concerns.    She denies chest pain, palpitations, dyspnea, PND, orthopnea, nausea, vomiting, dizziness, syncope, edema, weight gain, or early satiety.   Review of systems complete and found to be negative unless listed in HPI.   EP Information / Studies Reviewed:    EKG is not ordered today. EKG from 05/03/23 reviewed which showed ASVP 71 bpm      PPM Interrogation-  reviewed in detail today,  See PACEART report.  Device History: Medtronic Dual Chamber PPM implanted 01/29/2014 for CHB Dependent, no R waves at 30 05/18/23   Studies:  ECHO (PCV) 05/2012 > LVEF 60-65%, normal LV size, GII DD   Arrhythmia / AAD CHB s/p PPM   Paroxysmal AF   Risk Assessment/Calculations:    CHA2DS2-VASc Score = 5   This indicates a 7.2% annual risk of stroke. The patient's score is based upon: CHF History: 0 HTN History: 1 Diabetes History: 0 Stroke History: 0 Vascular Disease History: 1 Age Score: 2 Gender Score: 1         Physical Exam:   VS:  BP (!) 142/56   Pulse 73   Ht 4\' 3"  (1.295 m)   Wt 127 lb 6.4 oz (57.8 kg)   SpO2 97%   BMI 34.44 kg/m    Wt Readings from Last 3 Encounters:  05/18/23 127 lb 6.4 oz (57.8 kg)  05/03/23 123 lb 12.8 oz (56.2 kg)  03/24/23 126 lb 12.2 oz (57.5 kg)     GEN: Well nourished, well developed in no acute distress NECK: No JVD; No carotid bruits CARDIAC: Regular rate and rhythm, no murmurs, rubs, gallops RESPIRATORY:  Clear to  auscultation without rales, wheezing or rhonchi  ABDOMEN: Soft, non-tender, non-distended EXTREMITIES:  No edema; No deformity   ASSESSMENT AND PLAN:    CHB s/p Medtronic PPM  -Normal PPM function -See Pace Art report -pt dependant / no R waves at 30 bpm -No changes today -at RRT as of 03/24/23, planned for generator change on 06/01/23  -pre-procedure labs > CBC, BMP -procedure discussed with patient & daughter, instructions provided    Paroxysmal AF  CHA2DS2-VASc 5 / 7.2% annual risk of CVA  -OAC for stroke prophylaxis   Secondary Hypercoagulable State  -continue Eliquis 2.5mg  BID, previously reduced for wt / age   Hypertension  -mildly elevated in clinic, monitor for now     Disposition:   Follow up with Dr. Nelly Laurence  as planned for generator change   Signed, Canary Brim, NP-C, AGACNP-BC  [facility] - Elect[facility]iology  05/18/2023, 5:31 PM

## 2023-05-19 LAB — BASIC METABOLIC PANEL
BUN/Creatinine Ratio: 18 (ref 12–28)
BUN: 20 mg/dL (ref 10–36)
CO2: 20 mmol/L (ref 20–29)
Calcium: 9.6 mg/dL (ref 8.7–10.3)
Chloride: 101 mmol/L (ref 96–106)
Creatinine, Ser: 1.1 mg/dL — ABNORMAL HIGH (ref 0.57–1.00)
Glucose: 88 mg/dL (ref 70–99)
Potassium: 4.8 mmol/L (ref 3.5–5.2)
Sodium: 136 mmol/L (ref 134–144)
eGFR: 46 mL/min/{1.73_m2} — ABNORMAL LOW (ref >59)

## 2023-05-19 LAB — CBC
Hematocrit: 36.2 % (ref 34.0–46.6)
Hemoglobin: 11.4 g/dL (ref 11.1–15.9)
MCH: 29.2 pg (ref 26.6–33.0)
MCHC: 31.5 g/dL (ref 31.5–35.7)
MCV: 93 fL (ref 79–97)
Platelets: 277 10*3/uL (ref 150–450)
RBC: 3.91 x10E6/uL (ref 3.77–5.28)
RDW: 13.3 % (ref 11.7–15.4)
WBC: 7.7 10*3/uL (ref 3.4–10.8)

## 2023-05-19 NOTE — Telephone Encounter (Signed)
 Procedure scheduled 2/18 with Dr. Mealor

## 2023-06-01 ENCOUNTER — Encounter (HOSPITAL_COMMUNITY): Payer: Self-pay | Admitting: Cardiovascular Disease

## 2023-06-01 ENCOUNTER — Ambulatory Visit (HOSPITAL_COMMUNITY)
Admission: RE | Admit: 2023-06-01 | Discharge: 2023-06-01 | Disposition: A | Discharge status: Home / Self Care | Payer: Medicare HMO | Attending: Cardiovascular Disease | Admitting: Cardiovascular Disease

## 2023-06-01 ENCOUNTER — Encounter (HOSPITAL_COMMUNITY)
Admission: RE | Disposition: A | Discharge status: Home / Self Care | Payer: Self-pay | Source: Home / Self Care | Attending: Cardiovascular Disease

## 2023-06-01 ENCOUNTER — Other Ambulatory Visit: Payer: Self-pay

## 2023-06-01 DIAGNOSIS — I480 Paroxysmal atrial fibrillation: Secondary | ICD-10-CM | POA: Insufficient documentation

## 2023-06-01 DIAGNOSIS — Z7901 Long term (current) use of anticoagulants: Secondary | ICD-10-CM | POA: Diagnosis not present

## 2023-06-01 DIAGNOSIS — I442 Atrioventricular block, complete: Secondary | ICD-10-CM | POA: Diagnosis not present

## 2023-06-01 DIAGNOSIS — Z45010 Encounter for checking and testing of cardiac pacemaker pulse generator [battery]: Secondary | ICD-10-CM

## 2023-06-01 DIAGNOSIS — I10 Essential (primary) hypertension: Secondary | ICD-10-CM | POA: Diagnosis not present

## 2023-06-01 DIAGNOSIS — D649 Anemia, unspecified: Secondary | ICD-10-CM | POA: Diagnosis not present

## 2023-06-01 DIAGNOSIS — E785 Hyperlipidemia, unspecified: Secondary | ICD-10-CM | POA: Diagnosis not present

## 2023-06-01 DIAGNOSIS — D6869 Other thrombophilia: Secondary | ICD-10-CM | POA: Insufficient documentation

## 2023-06-01 DIAGNOSIS — Z79899 Other long term (current) drug therapy: Secondary | ICD-10-CM | POA: Diagnosis not present

## 2023-06-01 HISTORY — PX: PPM GENERATOR CHANGEOUT: EP1233

## 2023-06-01 SURGERY — PPM GENERATOR CHANGEOUT

## 2023-06-01 MED ORDER — SODIUM CHLORIDE 0.9 % IV SOLN: INTRAVENOUS | Status: DC

## 2023-06-01 MED ORDER — SODIUM CHLORIDE 0.9 % IV SOLN: 80.0000 mg | INTRAVENOUS | Status: AC

## 2023-06-01 MED ORDER — LIDOCAINE HCL (PF) 1 % IJ SOLN
INTRAMUSCULAR | Status: AC
  Filled 2023-06-01: qty 60

## 2023-06-01 MED ORDER — SODIUM CHLORIDE 0.9 % IV SOLN
INTRAVENOUS | Status: AC
  Administered 2023-06-01: 80 mg
  Filled 2023-06-01: qty 2

## 2023-06-01 MED ORDER — ONDANSETRON HCL 4 MG/2ML IJ SOLN: 4.0000 mg | Freq: Four times a day (QID) | INTRAMUSCULAR | Status: DC | PRN

## 2023-06-01 MED ORDER — CEFAZOLIN SODIUM-DEXTROSE 2-4 GM/100ML-% IV SOLN: 2.0000 g | INTRAVENOUS | Status: AC

## 2023-06-01 MED ORDER — POVIDONE-IODINE 10 % EX SWAB
2.0000 | Freq: Once | CUTANEOUS | Status: DC
Start: 2023-06-01 — End: 2023-06-01

## 2023-06-01 MED ORDER — ACETAMINOPHEN 325 MG PO TABS: 325.0000 mg | ORAL_TABLET | ORAL | Status: DC | PRN

## 2023-06-01 MED ORDER — LIDOCAINE HCL (PF) 1 % IJ SOLN
INTRAMUSCULAR | Status: DC | PRN
  Administered 2023-06-01: 50 mL

## 2023-06-01 MED ORDER — CEFAZOLIN SODIUM-DEXTROSE 2-4 GM/100ML-% IV SOLN
INTRAVENOUS | Status: AC
  Administered 2023-06-01: 2 g via INTRAVENOUS
  Filled 2023-06-01: qty 100

## 2023-06-01 MED ORDER — FENTANYL CITRATE (PF) 100 MCG/2ML IJ SOLN
INTRAMUSCULAR | Status: DC | PRN
  Administered 2023-06-01: 25 ug via INTRAVENOUS

## 2023-06-01 MED ORDER — CHLORHEXIDINE GLUCONATE 4 % EX SOLN: 4.0000 | Freq: Once | CUTANEOUS | Status: DC

## 2023-06-01 MED ORDER — APIXABAN 2.5 MG PO TABS: 2.5000 mg | ORAL_TABLET | Freq: Two times a day (BID) | ORAL | Status: DC

## 2023-06-01 MED ORDER — FENTANYL CITRATE (PF) 100 MCG/2ML IJ SOLN
INTRAMUSCULAR | Status: AC
  Filled 2023-06-01: qty 2

## 2023-06-01 SURGICAL SUPPLY — 10 items
CABLE SURGICAL S-101-97-12 (CABLE) ×1 IMPLANT
DEVICE DISSECT PLASMABLAD 3.0S (MISCELLANEOUS) IMPLANT
IPG PACE AZUR XT DR MRI W1DR01 (Pacemaker) IMPLANT
KIT WRENCH (KITS) IMPLANT
PACE AZURE XT DR MRI W1DR01 (Pacemaker) ×1 IMPLANT
PAD DEFIB RADIO PHYSIO CONN (PAD) ×1 IMPLANT
PLASMABLADE 3.0S (MISCELLANEOUS) ×1
POUCH AIGIS-R ANTIBACT PPM (Mesh General) ×1 IMPLANT
POUCH AIGIS-R ANTIBACT PPM MED (Mesh General) IMPLANT
TRAY PACEMAKER INSERTION (PACKS) ×1 IMPLANT

## 2023-06-01 NOTE — Interval H&P Note (Signed)
History and Physical Interval Note:  06/01/2023 12:29 PM  Sarah Burns  has presented today for surgery, with the diagnosis of eri.  The various methods of treatment have been discussed with the patient and family. After consideration of risks, benefits and other options for treatment, the patient has consented to  Procedure(s): PPM GENERATOR CHANGEOUT (N/A) as a surgical intervention.  The patient's history has been reviewed, patient examined, no change in status, stable for surgery.  I have reviewed the patient's chart and labs.  Questions were answered to the patient's satisfaction.     Roberts Gaudy Jenny Omdahl

## 2023-06-01 NOTE — Discharge Instructions (Signed)

## 2023-06-07 ENCOUNTER — Encounter: Payer: Medicare HMO | Admitting: Pulmonary Disease

## 2023-06-16 ENCOUNTER — Ambulatory Visit: Payer: Medicare HMO

## 2023-06-22 ENCOUNTER — Ambulatory Visit: Admitting: Student

## 2023-06-23 ENCOUNTER — Ambulatory Visit: Attending: Cardiology

## 2023-06-23 DIAGNOSIS — I442 Atrioventricular block, complete: Secondary | ICD-10-CM | POA: Diagnosis not present

## 2023-06-23 LAB — CUP PACEART INCLINIC DEVICE CHECK
Battery Remaining Longevity: 136 mo
Battery Voltage: 3.22 V
Brady Statistic AP VP Percent: 31.98 %
Brady Statistic AP VS Percent: 0 %
Brady Statistic AS VP Percent: 68.01 %
Brady Statistic AS VS Percent: 0.01 %
Brady Statistic RA Percent Paced: 31.95 %
Brady Statistic RV Percent Paced: 99.99 %
Date Time Interrogation Session: 20250312165949
Implantable Lead Connection Status: 753985
Implantable Lead Connection Status: 753985
Implantable Lead Implant Date: 20151019
Implantable Lead Implant Date: 20151019
Implantable Lead Location: 753859
Implantable Lead Location: 753860
Implantable Lead Model: 5076
Implantable Lead Model: 5076
Implantable Pulse Generator Implant Date: 20250218
Lead Channel Impedance Value: 304 Ohm
Lead Channel Impedance Value: 304 Ohm
Lead Channel Impedance Value: 380 Ohm
Lead Channel Impedance Value: 399 Ohm
Lead Channel Pacing Threshold Amplitude: 0.625 V
Lead Channel Pacing Threshold Amplitude: 1 V
Lead Channel Pacing Threshold Pulse Width: 0.4 ms
Lead Channel Pacing Threshold Pulse Width: 0.4 ms
Lead Channel Sensing Intrinsic Amplitude: 3.875 mV
Lead Channel Sensing Intrinsic Amplitude: 4.25 mV
Lead Channel Setting Pacing Amplitude: 1.5 V
Lead Channel Setting Pacing Amplitude: 2.25 V
Lead Channel Setting Pacing Pulse Width: 0.4 ms
Lead Channel Setting Sensing Sensitivity: 1.2 mV
Zone Setting Status: 755011
Zone Setting Status: 755011

## 2023-06-23 NOTE — Progress Notes (Signed)
 Normal Pacemaker wound check. Wound well healed. Thresholds, sensing, and impedances consistent with prior measurements.  Outputs programmed at chronic settings post gen change. No episodes.  Pt enrolled in remote follow-up.

## 2023-06-23 NOTE — Patient Instructions (Signed)

## 2023-06-29 DIAGNOSIS — I10 Essential (primary) hypertension: Secondary | ICD-10-CM | POA: Diagnosis not present

## 2023-06-29 DIAGNOSIS — J301 Allergic rhinitis due to pollen: Secondary | ICD-10-CM | POA: Diagnosis not present

## 2023-06-29 DIAGNOSIS — D6869 Other thrombophilia: Secondary | ICD-10-CM | POA: Diagnosis not present

## 2023-06-29 DIAGNOSIS — E782 Mixed hyperlipidemia: Secondary | ICD-10-CM | POA: Diagnosis not present

## 2023-06-29 DIAGNOSIS — M130 Polyarthritis, unspecified: Secondary | ICD-10-CM | POA: Diagnosis not present

## 2023-06-29 DIAGNOSIS — E559 Vitamin D deficiency, unspecified: Secondary | ICD-10-CM | POA: Diagnosis not present

## 2023-06-29 DIAGNOSIS — I480 Paroxysmal atrial fibrillation: Secondary | ICD-10-CM | POA: Diagnosis not present

## 2023-06-29 DIAGNOSIS — Z950 Presence of cardiac pacemaker: Secondary | ICD-10-CM | POA: Diagnosis not present

## 2023-06-29 DIAGNOSIS — J449 Chronic obstructive pulmonary disease, unspecified: Secondary | ICD-10-CM | POA: Diagnosis not present

## 2023-07-19 ENCOUNTER — Ambulatory Visit (INDEPENDENT_AMBULATORY_CARE_PROVIDER_SITE_OTHER): Payer: Medicare HMO

## 2023-07-19 DIAGNOSIS — I442 Atrioventricular block, complete: Secondary | ICD-10-CM | POA: Diagnosis not present

## 2023-07-19 LAB — CUP PACEART REMOTE DEVICE CHECK
Battery Remaining Longevity: 135 mo
Battery Voltage: 3.22 V
Brady Statistic AP VP Percent: 20.88 %
Brady Statistic AP VS Percent: 0 %
Brady Statistic AS VP Percent: 79.11 %
Brady Statistic AS VS Percent: 0.01 %
Brady Statistic RA Percent Paced: 20.85 %
Brady Statistic RV Percent Paced: 99.99 %
Date Time Interrogation Session: 20250406203924
Implantable Lead Connection Status: 753985
Implantable Lead Connection Status: 753985
Implantable Lead Implant Date: 20151019
Implantable Lead Implant Date: 20151019
Implantable Lead Location: 753859
Implantable Lead Location: 753860
Implantable Lead Model: 5076
Implantable Lead Model: 5076
Implantable Pulse Generator Implant Date: 20250218
Lead Channel Impedance Value: 285 Ohm
Lead Channel Impedance Value: 285 Ohm
Lead Channel Impedance Value: 361 Ohm
Lead Channel Impedance Value: 399 Ohm
Lead Channel Pacing Threshold Amplitude: 0.625 V
Lead Channel Pacing Threshold Amplitude: 1.125 V
Lead Channel Pacing Threshold Pulse Width: 0.4 ms
Lead Channel Pacing Threshold Pulse Width: 0.4 ms
Lead Channel Sensing Intrinsic Amplitude: 3.5 mV
Lead Channel Sensing Intrinsic Amplitude: 3.5 mV
Lead Channel Setting Pacing Amplitude: 1.5 V
Lead Channel Setting Pacing Amplitude: 2.25 V
Lead Channel Setting Pacing Pulse Width: 0.4 ms
Lead Channel Setting Sensing Sensitivity: 1.2 mV
Zone Setting Status: 755011
Zone Setting Status: 755011

## 2023-08-05 DIAGNOSIS — J449 Chronic obstructive pulmonary disease, unspecified: Secondary | ICD-10-CM | POA: Diagnosis not present

## 2023-08-05 DIAGNOSIS — I10 Essential (primary) hypertension: Secondary | ICD-10-CM | POA: Diagnosis not present

## 2023-08-05 DIAGNOSIS — J209 Acute bronchitis, unspecified: Secondary | ICD-10-CM | POA: Diagnosis not present

## 2023-08-06 NOTE — Telephone Encounter (Signed)
 Paperwork has been discarded since patient hasn't picked it from our office.

## 2023-08-16 DIAGNOSIS — Z1231 Encounter for screening mammogram for malignant neoplasm of breast: Secondary | ICD-10-CM | POA: Diagnosis not present

## 2023-08-25 ENCOUNTER — Telehealth: Payer: Self-pay

## 2023-08-25 NOTE — Progress Notes (Signed)
   08/25/2023  Patient ID: Sarah Burns, female   DOB: 1924-06-10, 89 y.o.   MRN: [id number]  Contacted patient, and spoke with her daughter, regarding medication adherence from a quality report for [facility]. The patient is at-risk in MAD.     Per DrFirst fill history: Losartan  50 mg - last filled 08/12/23 for a 90-day supply.  Atorvastatin  80 mg - last filled 08/12/23 for a 90-day supply.  Farxiga 10 mg - last filled 07/30/23 for a 30-day supply.   The patient is on multiple branded medications. I screened for medication assistance, her daughter is unsure of exact income. The patient has a PCP visit tomorrow, we will discuss in person and apply for LIS/PAP at that time..    I will follow up for adherence monitoring.   Thank you for allowing pharmacy to be a part of this patient's care.    Livia Riffle, PharmD Clinical Pharmacist  [phone]

## 2023-08-26 ENCOUNTER — Telehealth: Payer: Self-pay

## 2023-08-26 ENCOUNTER — Other Ambulatory Visit (HOSPITAL_COMMUNITY): Payer: Self-pay

## 2023-08-26 DIAGNOSIS — J449 Chronic obstructive pulmonary disease, unspecified: Secondary | ICD-10-CM | POA: Diagnosis not present

## 2023-08-26 DIAGNOSIS — J4520 Mild intermittent asthma, uncomplicated: Secondary | ICD-10-CM | POA: Diagnosis not present

## 2023-08-26 DIAGNOSIS — I10 Essential (primary) hypertension: Secondary | ICD-10-CM | POA: Diagnosis not present

## 2023-08-26 NOTE — Progress Notes (Signed)
   08/26/2023  Patient ID: Sarah Burns, female   DOB: 02-13-1925, 89 y.o.   MRN: [id number]  Spoke to patient and her daughter today at PCP visit. Ran test claim for patient and verified that she has met her deductible and now has a $0 copay for brand medications. We will not move forward with patient assistance at this time.   I assisted the patient with refilling her [place]. The patient nor her daughter have any other medication concerns at this time.   Thank you for allowing pharmacy to be a part of this patient's care.    Livia Riffle, PharmD Clinical Pharmacist  [phone]

## 2023-08-26 NOTE — Telephone Encounter (Signed)
 Pharmacy Patient Advocate Encounter  Insurance verification completed.   The patient is insured through [employer]   Ran test claim for [employer] Currently a quantity of 30 is a 30 day supply and the co-pay is $0 .   This test claim was processed through [facility]- copay amounts may vary at other pharmacies due to pharmacy/plan contracts, or as the patient moves through the different stages of their insurance plan.

## 2023-08-31 NOTE — Progress Notes (Signed)
 Remote pacemaker transmission.

## 2023-09-03 ENCOUNTER — Encounter: Payer: Self-pay | Admitting: Physician Assistant

## 2023-09-03 ENCOUNTER — Ambulatory Visit: Payer: Medicare HMO | Attending: Physician Assistant | Admitting: Physician Assistant

## 2023-09-03 VITALS — BP 150/72 | HR 71 | Ht 63.0 in | Wt 125.6 lb

## 2023-09-03 DIAGNOSIS — Z950 Presence of cardiac pacemaker: Secondary | ICD-10-CM

## 2023-09-03 DIAGNOSIS — D6869 Other thrombophilia: Secondary | ICD-10-CM | POA: Diagnosis not present

## 2023-09-03 DIAGNOSIS — I480 Paroxysmal atrial fibrillation: Secondary | ICD-10-CM | POA: Diagnosis not present

## 2023-09-03 LAB — CUP PACEART INCLINIC DEVICE CHECK
Battery Remaining Longevity: 145 mo
Battery Voltage: 3.2 V
Brady Statistic AP VP Percent: 32.06 %
Brady Statistic AP VS Percent: 0 %
Brady Statistic AS VP Percent: 67.92 %
Brady Statistic AS VS Percent: 0.01 %
Brady Statistic RA Percent Paced: 32.04 %
Brady Statistic RV Percent Paced: 99.99 %
Date Time Interrogation Session: 20250523164441
Implantable Lead Connection Status: 753985
Implantable Lead Connection Status: 753985
Implantable Lead Implant Date: 20151019
Implantable Lead Implant Date: 20151019
Implantable Lead Location: 753859
Implantable Lead Location: 753860
Implantable Lead Model: 5076
Implantable Lead Model: 5076
Implantable Pulse Generator Implant Date: 20250218
Lead Channel Impedance Value: 323 Ohm
Lead Channel Impedance Value: 323 Ohm
Lead Channel Impedance Value: 399 Ohm
Lead Channel Impedance Value: 418 Ohm
Lead Channel Pacing Threshold Amplitude: 0.625 V
Lead Channel Pacing Threshold Amplitude: 1 V
Lead Channel Pacing Threshold Pulse Width: 0.4 ms
Lead Channel Pacing Threshold Pulse Width: 0.4 ms
Lead Channel Sensing Intrinsic Amplitude: 18 mV
Lead Channel Sensing Intrinsic Amplitude: 18 mV
Lead Channel Sensing Intrinsic Amplitude: 3.875 mV
Lead Channel Sensing Intrinsic Amplitude: 4.5 mV
Lead Channel Setting Pacing Amplitude: 1.5 V
Lead Channel Setting Pacing Amplitude: 2 V
Lead Channel Setting Pacing Pulse Width: 0.4 ms
Lead Channel Setting Sensing Sensitivity: 1.2 mV
Zone Setting Status: 755011
Zone Setting Status: 755011

## 2023-09-03 NOTE — Patient Instructions (Addendum)
 Medication Instructions:    Your physician recommends that you continue on your current medications as directed. Please refer to the Current Medication list given to you today.  *If you need a refill on your cardiac medications before your next appointment, please call your pharmacy*   Lab Work: NONE ORDERED  TODAY    If you have labs (blood work) drawn today and your tests are completely normal, you will receive your results only by: MyChart Message (if you have MyChart) OR A paper copy in the mail If you have any lab test that is abnormal or we need to change your treatment, we will call you to review the results.  Testing/Procedures: NONE ORDERED  TODAY    Follow-Up: At [facility], you and your health needs are our priority.  As part of our continuing mission to provide you with exceptional heart care, our providers are all part of one team.  This team includes your primary Cardiologist (physician) and Advanced Practice Providers or APPs (Physician Assistants and Nurse Practitioners) who all work together to provide you with the care you need, when you need it.  Your next appointment:   1 year(s)  Provider:   You may see Efraim Grange, MD or one of the following Advanced Practice Providers on your designated Care Team:   Mertha Abrahams, [place]  We recommend signing up for the patient portal called "MyChart".  Sign up information is provided on this After Visit Summary.  MyChart is used to connect with patients for Virtual Visits (Telemedicine).  Patients are able to view lab/test results, encounter notes, upcoming appointments, etc.  Non-urgent messages can be sent to your provider as well.   To learn more about what you can do with MyChart, go to [email or web address].     Other Instructions

## 2023-09-03 NOTE — Progress Notes (Signed)
  Cardiology Office Note:  .   Date:  09/03/2023  ID:  Sarah Burns, Sarah Burns 08/26/1924, MRN [id number] PCP: Sarah Neighbors, MD  Sarah Burns Providers Cardiologist:  Sarah Bertrand, DO Electrophysiologist:  Sarah Grange, MD {  History of Present Illness: .   Sarah Burns is a 89 y.o. female w/PMHx of  HTN, HLD PPM AFib  PPM previously managed w/Dr. Albert Burns, reached ERI and referred to EP team  Saw Sarah Burns 05/18/23 RRT reached in Dec, 2024, noted dependent on her device, planned for gen change Underwent gen change 06/01/23 w/Dr. Arlester Burns   Today's visit is scheduled as her 89 post gen change visit ROS:   She is accompanied by her daughter (she lives with her as well) She ambulates with a cane at home, denies any falls No CP, palpitations or cardiac awareness No near syncope or syncope. No SOB No bleeding or signs of bleeding  Device information MDT dual chamber PPM implanted 01/29/2014, gen change 06/01/2023  Arrhythmia/AAD hx AFib No AAD to date that I find  Studies Reviewed: Sarah Burns    EKG not done today  DEVICE interrogation done today and reviewed by myself Battery and lead measurements are good No AFib She is dependent   ECHO (PCV) 05/2012 > LVEF 60-65%, normal LV size, GII DD    Risk Assessment/Calculations:    Physical Exam:   VS:  There were no vitals taken for this visit.   Wt Readings from Last 3 Encounters:  06/01/23 127 lb (57.6 kg)  05/18/23 127 lb 6.4 oz (57.8 kg)  05/03/23 123 lb 12.8 oz (56.2 kg)    GEN: Well nourished, well developed in no acute distress NECK: No JVD; No carotid bruits CARDIAC: RRR, no murmurs, rubs, gallops RESPIRATORY:  CTA b/l without rales, wheezing or rhonchi  ABDOMEN: Soft, non-tender, non-distended EXTREMITIES: No edema; No deformity   PPM site: well healed, is stable, no thinning, fluctuation, tethering  ASSESSMENT AND PLAN: .    PPM intact function no programming changes made  Paroxysmal Afib CHA2DS2Vasc  is 4, on Eliquis , appropriately dosed zero % burden  Secondary hypercoagulable state 2/2 AFib   Dispo: remotes as usual, back in clinic with EP again in a year, sooner if needed  Signed, Sarah Fails, PA-C

## 2023-10-18 ENCOUNTER — Ambulatory Visit

## 2023-10-18 DIAGNOSIS — I442 Atrioventricular block, complete: Secondary | ICD-10-CM

## 2023-10-19 LAB — CUP PACEART REMOTE DEVICE CHECK
Battery Remaining Longevity: 132 mo
Battery Voltage: 3.19 V
Brady Statistic AP VP Percent: 40.86 %
Brady Statistic AP VS Percent: 0 %
Brady Statistic AS VP Percent: 59.13 %
Brady Statistic AS VS Percent: 0.01 %
Brady Statistic RA Percent Paced: 40.84 %
Brady Statistic RV Percent Paced: 99.99 %
Date Time Interrogation Session: 20250707005901
Implantable Lead Connection Status: 753985
Implantable Lead Connection Status: 753985
Implantable Lead Implant Date: 20151019
Implantable Lead Implant Date: 20151019
Implantable Lead Location: 753859
Implantable Lead Location: 753860
Implantable Lead Model: 5076
Implantable Lead Model: 5076
Implantable Pulse Generator Implant Date: 20250218
Lead Channel Impedance Value: 304 Ohm
Lead Channel Impedance Value: 304 Ohm
Lead Channel Impedance Value: 380 Ohm
Lead Channel Impedance Value: 399 Ohm
Lead Channel Pacing Threshold Amplitude: 0.625 V
Lead Channel Pacing Threshold Amplitude: 1 V
Lead Channel Pacing Threshold Pulse Width: 0.4 ms
Lead Channel Pacing Threshold Pulse Width: 0.4 ms
Lead Channel Sensing Intrinsic Amplitude: 18 mV
Lead Channel Sensing Intrinsic Amplitude: 18 mV
Lead Channel Sensing Intrinsic Amplitude: 4 mV
Lead Channel Sensing Intrinsic Amplitude: 4 mV
Lead Channel Setting Pacing Amplitude: 1.5 V
Lead Channel Setting Pacing Amplitude: 2.25 V
Lead Channel Setting Pacing Pulse Width: 0.4 ms
Lead Channel Setting Sensing Sensitivity: 1.2 mV
Zone Setting Status: 755011
Zone Setting Status: 755011

## 2023-10-20 ENCOUNTER — Telehealth: Payer: Self-pay

## 2023-10-20 NOTE — Telephone Encounter (Signed)
 PPM scheduled remote review:  1 VHR detection consistent with 23 beat NSVT with some of episode occurring below detection rate.  Patient is 89 yo female with recent PPM gen change in February 2025. No noted his of VT events. HR control okay on HG's.  Short episode - flagging only due to several beats below monitor zone and first known NSVT event.

## 2023-11-09 DIAGNOSIS — I119 Hypertensive heart disease without heart failure: Secondary | ICD-10-CM | POA: Diagnosis not present

## 2023-11-09 DIAGNOSIS — R519 Headache, unspecified: Secondary | ICD-10-CM | POA: Diagnosis not present

## 2023-11-09 DIAGNOSIS — I480 Paroxysmal atrial fibrillation: Secondary | ICD-10-CM | POA: Diagnosis not present

## 2023-11-09 DIAGNOSIS — I10 Essential (primary) hypertension: Secondary | ICD-10-CM | POA: Diagnosis not present

## 2023-12-03 ENCOUNTER — Ambulatory Visit: Attending: Cardiology | Admitting: Cardiology

## 2023-12-03 ENCOUNTER — Encounter: Payer: Self-pay | Admitting: Cardiology

## 2023-12-03 VITALS — BP 140/62 | HR 83 | Resp 16 | Ht 63.0 in | Wt 129.4 lb

## 2023-12-03 DIAGNOSIS — I480 Paroxysmal atrial fibrillation: Secondary | ICD-10-CM

## 2023-12-03 DIAGNOSIS — I700 Atherosclerosis of aorta: Secondary | ICD-10-CM

## 2023-12-03 DIAGNOSIS — I10 Essential (primary) hypertension: Secondary | ICD-10-CM | POA: Diagnosis not present

## 2023-12-03 DIAGNOSIS — I6523 Occlusion and stenosis of bilateral carotid arteries: Secondary | ICD-10-CM | POA: Diagnosis not present

## 2023-12-03 DIAGNOSIS — Z7901 Long term (current) use of anticoagulants: Secondary | ICD-10-CM

## 2023-12-03 DIAGNOSIS — Z950 Presence of cardiac pacemaker: Secondary | ICD-10-CM

## 2023-12-03 DIAGNOSIS — D6869 Other thrombophilia: Secondary | ICD-10-CM

## 2023-12-03 MED ORDER — APIXABAN 2.5 MG PO TABS: 2.5000 mg | ORAL_TABLET | Freq: Two times a day (BID) | ORAL | 3 refills | Status: AC

## 2023-12-03 MED ORDER — APIXABAN 2.5 MG PO TABS: 2.5000 mg | ORAL_TABLET | Freq: Two times a day (BID) | ORAL | 3 refills | Status: DC

## 2023-12-03 NOTE — Patient Instructions (Signed)
 Medication Instructions:  Refill for Eliquis  has been sent to your pharmacy.  *If you need a refill on your cardiac medications before your next appointment, please call your pharmacy*  Lab Work: To be completed today: hemoglobin/hematocrit and BMP  To be completed in 6 months: hemoglobin/hematocrit and BMP  If you have labs (blood work) drawn today and your tests are completely normal, you will receive your results only by: MyChart Message (if you have MyChart) OR A paper copy in the mail If you have any lab test that is abnormal or we need to change your treatment, we will call you to review the results.  Testing/Procedures: None ordered today.  Follow-Up: At [facility], you and your health needs are our priority.  As part of our continuing mission to provide you with exceptional heart care, our providers are all part of one team.  This team includes your primary Cardiologist (physician) and Advanced Practice Providers or APPs (Physician Assistants and Nurse Practitioners) who all work together to provide you with the care you need, when you need it.  Your next appointment:   6 month(s)  Provider:   Madonna Large, DO

## 2023-12-03 NOTE — Progress Notes (Signed)
**Note Sarah-Identified via Obfuscation**  Cardiology Office Note:  .   Date:  12/03/2023  ID:  Sarah Burns, [place] 21-Dec-1924, MRN [id number] PCP:  Benjamine Aland, MD  Former Cardiology Providers: Dr. Norleen Crawley at [facility] Providers Cardiologist:  Madonna Large, DO, [facility] (established care 03/11/2020)  Electrophysiologist:  Eulas FORBES Furbish, MD  Electrophysiologist:  Eulas FORBES Furbish, MD  Click to update primary MD,subspecialty MD or APP then REFRESH:1}    Chief Complaint  Patient presents with   Atrial Fibrillation   Follow-up    History of Present Illness: .   Debralee Burns is a 89 y.o. African-American female whose past medical history and cardiovascular risk factors includes: Paroxysmal atrial fibrillation, presence of a permanent pacemaker, hypertension, hypercholesterolemia, former smoker, bilateral asymptomatic carotid artery disease, postmenopausal female, advanced age.   Patient was referred to the office back in 2021 to establish care given history of paroxysmal atrial fibrillation, pacemaker implant, and carotid artery disease.  Patient is accompanied by her daughter Sarah Burns at today's office visit.  Paroxysmal atrial fibrillation: Has not been experiencing palpitations like she was in the past. Ventricular rates are acceptable. Currently not on AV nodal blocking agents, was on Toprol -XL in the past. No recent falls since last office visit. Does not endorse evidence of bleeding  Pacemaker implant: Since last office visit patient had an generator replacement in February 2025 as the prior device was reaching ERI.  She had a follow-up appointment with electrophysiology APP in May 2025 pacemaker function was within acceptable limits.   Carotid artery disease: Last carotid duplex in February 2025 noted bilateral disease <40%.  Recommended continued medical management.   Review of Systems: .   Review of Systems  Cardiovascular:  Negative for chest pain, claudication, irregular  heartbeat, leg swelling, near-syncope, orthopnea, palpitations, paroxysmal nocturnal dyspnea and syncope.  Respiratory:  Negative for shortness of breath.   Hematologic/Lymphatic: Negative for bleeding problem.  Musculoskeletal:  Positive for falls.  Neurological:  Positive for headaches.    Studies Reviewed:   EKG: EKG Interpretation Date/Time:  Friday December 03 2023 15:37:51 EDT Ventricular Rate:  81 PR Interval:  172 QRS Duration:  150 QT Interval:  438 QTC Calculation: 508 R Axis:   -82  Text Interpretation: Atrial-sensed ventricular-paced rhythm When compared with ECG of 03-May-2023 14:33, Vent. rate has increased BY  10 BPM Confirmed by Large Madonna [id number]) on 12/03/2023 3:48:11 PM  Echocardiogram: 07/28/2020: LVEF 65 to 70%, mild LVH, diastolic parameters indeterminate, moderate LAE & RAE.  06/10/2022:  Normal LV systolic function with visual EF 60-65%. Left ventricle cavity is normal in size. Normal global wall motion. Mild left ventricular  hypertrophy. Doppler evidence of grade II (pseudonormal) diastolic dysfunction, elevated LAP.  Left atrial cavity is mildly dilated at 37.47 ml/m^2.  Aortic valve sclerosis without stenosis.  The aortic root and proximal ascending aorta not well visualized.  Compared to 07/28/2020 G2DD and elevated LAP are new findings, mild AS is not seen on current study.   Carotid artery duplex: 04/2023 Right Carotid: Velocities in the right ICA are consistent with a 1-39% stenosis.  Non-hemodynamically significant plaque <50% noted in the CCA. The ECA appears >50% stenosed.   Left Carotid: Velocities in the left ICA are consistent with a 1-39% stenosis. Hemodynamically significant plaque >50% visualized in the CCA. The ECA appears >50% stenosed.   Vertebrals:  Bilateral vertebral arteries demonstrate antegrade flow.  Subclavians: Normal flow hemodynamics were seen in bilateral subclavian arteries.   RADIOLOGY: CTA head and  neck with and without  contrast: 10/24/2020 1. No acute intracranial abnormality.  Normal for age. 2. Less than 50% diameter stenosis proximal right internal carotid artery and moderate to severe stenosis origin of right external carotid artery. Moderate stenosis right cavernous carotid due to calcific disease. 3. 50% diameter stenosis distal left common carotid artery and 60% diameter stenosis proximal left internal carotid artery. Moderate to severe stenosis origin of left external carotid artery. Moderate stenosis left cavernous carotid due to calcific disease. 4. Both vertebral arteries widely patent 5. No intracranial large vessel occlusion or flow limiting stenosis.  Risk Assessment/Calculations:   Click Here to Calculate/Change CHADS2VASc Score The patient's CHADS2-VASc score is 5, indicating a 7.2% annual risk of stroke. CHF History: No HTN History: Yes Diabetes History: No Stroke History: No Vascular Disease History: Yes    Labs:       Latest Ref Rng & Units 05/18/2023    3:24 PM 11/25/2022    3:07 AM 09/30/2020   10:22 AM  CBC  WBC 3.4 - 10.8 x10E3/uL 7.7  4.5    Hemoglobin 11.1 - 15.9 g/dL 88.5  89.8  88.3   Hematocrit 34.0 - 46.6 % 36.2  30.0  34.2   Platelets 150 - 450 x10E3/uL 277  272         Latest Ref Rng & Units 05/18/2023    3:24 PM 11/26/2022   12:56 AM 11/25/2022    2:41 PM  BMP  Glucose 70 - 99 mg/dL 88  880  88   BUN 10 - 36 mg/dL 20  15  15    Creatinine 0.57 - 1.00 mg/dL 8.89  8.94  9.03   BUN/Creat Ratio 12 - 28 18     Sodium 134 - 144 mmol/L 136  127  125   Potassium 3.5 - 5.2 mmol/L 4.8  3.8  4.0   Chloride 96 - 106 mmol/L 101  97  94   CO2 20 - 29 mmol/L 20  22  19    Calcium  8.7 - 10.3 mg/dL 9.6  8.4  8.5       Latest Ref Rng & Units 05/18/2023    3:24 PM 11/26/2022   12:56 AM 11/25/2022    2:41 PM  CMP  Glucose 70 - 99 mg/dL 88  880  88   BUN 10 - 36 mg/dL 20  15  15    Creatinine 0.57 - 1.00 mg/dL 8.89  8.94  9.03   Sodium 134 - 144 mmol/L 136  127  125   Potassium  3.5 - 5.2 mmol/L 4.8  3.8  4.0   Chloride 96 - 106 mmol/L 101  97  94   CO2 20 - 29 mmol/L 20  22  19    Calcium  8.7 - 10.3 mg/dL 9.6  8.4  8.5     Lab Results  Component Value Date   CHOL 166 05/04/2022   HDL 82 05/04/2022   LDLCALC 71 05/04/2022   LDLDIRECT 74 05/04/2022   TRIG 65 05/04/2022   No results for input(s): LIPOA in the last 8760 hours. No components found for: NTPROBNP No results for input(s): PROBNP in the last 8760 hours. No results for input(s): TSH in the last 8760 hours.   Physical Exam:    Today's Vitals   12/03/23 1539 12/03/23 1600  BP: (!) 168/68 (!) 140/62  Pulse: 77 83  Resp: 16   SpO2: 93%   Weight: 129 lb 6.4 oz (58.7 kg)   Height: 5' 3 (1.6  m)     Body mass index is 22.92 kg/m. Wt Readings from Last 3 Encounters:  12/03/23 129 lb 6.4 oz (58.7 kg)  09/03/23 125 lb 9.6 oz (57 kg)  06/01/23 127 lb (57.6 kg)    Physical Exam  Constitutional: No distress. She appears chronically ill.  Hemodynamically stable.   Neck: No JVD present.  Cardiovascular: Regular rhythm, S1 normal, S2 normal, intact distal pulses and normal pulses. Exam reveals no gallop, no S3 and no S4.  No murmur heard. Pulses:      Carotid pulses are  on the right side with bruit. Pulmonary/Chest: Effort normal and breath sounds normal. No stridor. She has no wheezes. She has no rales.  Pacemaker located left infraclavicular region  Abdominal: Soft. Bowel sounds are normal. She exhibits no distension. There is no abdominal tenderness.  Musculoskeletal:        General: No edema.     Cervical back: Neck supple.  Neurological: She is alert and oriented to person, place, and time. She has intact cranial nerves (2-12).  Skin: Skin is warm and moist.     Impression & Recommendation(s):  Impression:   ICD-10-CM   1. Paroxysmal atrial fibrillation (HCC)  I48.0 EKG 12-Lead    Basic metabolic panel with GFR    Hemoglobin and hematocrit, blood    Hemoglobin and  hematocrit, blood    Basic metabolic panel with GFR    Basic metabolic panel with GFR    Hemoglobin and hematocrit, blood    2. Secondary hypercoagulable state (HCC)  D68.69     3. Long term (current) use of anticoagulants  Z79.01 Basic metabolic panel with GFR    Hemoglobin and hematocrit, blood    Hemoglobin and hematocrit, blood    Basic metabolic panel with GFR    Basic metabolic panel with GFR    Hemoglobin and hematocrit, blood    4. Cardiac pacemaker in situ  Z95.0     5. Essential hypertension, benign  I10 Basic metabolic panel with GFR    Basic metabolic panel with GFR    Basic metabolic panel with GFR    6. Atherosclerosis of aorta (HCC)  I70.0     7. Bilateral carotid artery stenosis  I65.23         Recommendation(s):  Paroxysmal atrial fibrillation (HCC) EKG illustrates atrial sensed ventricular paced rhythm. No recurrence of palpitations like she has been having in the past. Was not overall XL 25 mg p.o. daily in the past but for reasons unknown she is not sure why it is discontinued.  Will hold off for now. Antiarrhythmic medications: N/A. Thromboembolic prophylaxis: Eliquis   Secondary hypercoagulable state (HCC) Long term (current) use of anticoagulants Indication: Paroxysmal atrial fibrillation. Does not endorse evidence of bleeding. No recurrence of falls. Uses a cane for ambulation/to prevent falls. Eliquis  refilled. Risks, benefits, and alternatives discussed. Will check H&H and BMP today and prior to her 38-month follow-up visit  Pacemaker Dual chamber Med[employer]onic Advisa DR MRI SureScan 01/29/2014 Implanted in October 2015 due to complete heart block Status post generator change February 2025. Follows with pacemaker clinic. Follows with device clinic/EP  Essential hypertension, benign Office blood pressures are acceptable upon recheck. Currently on losartan  50 mg p.o. every morning. Currently on Farxiga 10 mg p.o. every morning. Currently on  amlodipine  10 mg p.o. every afternoon Patient is been having sporadic headaches without a unifying diagnoses.  They do not check her blood pressures at home.  Recommended they start checking blood pressures  at home to make sure she is not hypertensive and they have been educated on symptoms suggestive of stroke and if present should go to the closest ER via EMS for further evaluation and management.  Daughter informs me today that PCP has requested a neurology consult for headache evaluation.  Atherosclerosis of aorta (HCC) Bilateral carotid artery stenosis Currently asymptomatic. Not on antiplatelets as she is on anticoagulation, to minimize risk of bleeding. Continue Lipitor  80 mg p.o. nightly  Orders Placed:  Orders Placed This Encounter  Procedures   Basic metabolic panel with GFR    Standing Status:   Future    Number of Occurrences:   1    Expected Date:   12/03/2023    Expiration Date:   12/02/2024   Hemoglobin and hematocrit, blood    Standing Status:   Future    Number of Occurrences:   1    Expected Date:   12/03/2023    Expiration Date:   12/02/2024   Basic metabolic panel with GFR    Standing Status:   Future    Expected Date:   06/04/2024    Expiration Date:   12/02/2024   Hemoglobin and hematocrit, blood    Standing Status:   Future    Expected Date:   06/04/2024    Expiration Date:   12/02/2024   EKG 12-Lead    Final Medication List:    Meds ordered this encounter  Medications   DISCONTD: apixaban  (ELIQUIS ) 2.5 MG TABS tablet    Sig: Take 1 tablet (2.5 mg total) by mouth 2 (two) times daily.    Dispense:  180 tablet    Refill:  3    Lot Number?:   [id number]    Expiration Date?:   04/13/2024    Manufacturer?:   [employer] [42]   apixaban  (ELIQUIS ) 2.5 MG TABS tablet    Sig: Take 1 tablet (2.5 mg total) by mouth 2 (two) times daily.    Dispense:  180 tablet    Refill:  3    Lot Number?:   [id number]    Expiration Date?:   04/13/2024    Manufacturer?:   [employer]  [42]    Medications Discontinued During This Encounter  Medication Reason   predniSONE  (DELTASONE ) 2.5 MG tablet Completed Course   apixaban  (ELIQUIS ) 2.5 MG TABS tablet Reorder   apixaban  (ELIQUIS ) 2.5 MG TABS tablet       Current Outpatient Medications:    acetaminophen  (TYLENOL ) 650 MG CR tablet, Take 1,300 mg by mouth every 8 (eight) hours as needed for pain., Disp: , Rfl:    albuterol  (PROVENTIL ) (2.5 MG/3ML) 0.083% nebulizer solution, Take 3 mLs (2.5 mg total) by nebulization every 4 (four) hours as needed for wheezing or shortness of breath., Disp: 90 mL, Rfl: 0   albuterol  (VENTOLIN  HFA) 108 (90 Base) MCG/ACT inhaler, Inhale 2 puffs into the lungs every 6 (six) hours as needed for wheezing or shortness of breath., Disp: , Rfl:    amLODipine  (NORVASC ) 10 MG tablet, Take 10 mg by mouth daily., Disp: , Rfl:    atorvastatin  (LIPITOR ) 80 MG tablet, Take 80 mg by mouth daily., Disp: , Rfl:    BREZTRI  AEROSPHERE 160-9-4.8 MCG/ACT AERO, SMARTSIG:2 Puff(s) By Mouth Twice Daily, Disp: 10.7 g, Rfl: 5   dapagliflozin propanediol (FARXIGA) 10 MG TABS tablet, Take 10 mg by mouth daily., Disp: , Rfl:    guaiFENesin  (MUCINEX ) 600 MG 12 hr tablet, Take 1 tablet (600 mg total) by  mouth 2 (two) times daily as needed. (Patient taking differently: Take 600 mg by mouth 2 (two) times daily as needed for to loosen phlegm.), Disp: 14 tablet, Rfl: 0   Lactase (LACTAID PO), Take 1 tablet by mouth as needed (when consuming dairy)., Disp: , Rfl:    losartan  (COZAAR ) 50 MG tablet, Take 50 mg by mouth daily., Disp: , Rfl:    montelukast  (SINGULAIR ) 10 MG tablet, Take 10 mg by mouth at bedtime., Disp: , Rfl:    apixaban  (ELIQUIS ) 2.5 MG TABS tablet, Take 1 tablet (2.5 mg total) by mouth 2 (two) times daily., Disp: 180 tablet, Rfl: 3  Consent:   N/A  Disposition:   6 months follow-up sooner if needed  Her questions and concerns were addressed to her satisfaction. She voices understanding of the recommendations  provided during this encounter.    Signed, Madonna Michele HAS, [facility]  A Division of [facility] [address] [place], [place] [address]  [place], [place] [address]  12/03/2023 4:07 PM

## 2023-12-04 ENCOUNTER — Ambulatory Visit: Payer: Self-pay | Admitting: Cardiology

## 2023-12-04 DIAGNOSIS — Z7901 Long term (current) use of anticoagulants: Secondary | ICD-10-CM

## 2023-12-04 DIAGNOSIS — I480 Paroxysmal atrial fibrillation: Secondary | ICD-10-CM

## 2023-12-04 LAB — BASIC METABOLIC PANEL WITH GFR
BUN/Creatinine Ratio: 16 (ref 12–28)
BUN: 17 mg/dL (ref 10–36)
CO2: 17 mmol/L — ABNORMAL LOW (ref 20–29)
Calcium: 9.7 mg/dL (ref 8.7–10.3)
Chloride: 99 mmol/L (ref 96–106)
Creatinine, Ser: 1.09 mg/dL — ABNORMAL HIGH (ref 0.57–1.00)
Glucose: 78 mg/dL (ref 70–99)
Potassium: 4.7 mmol/L (ref 3.5–5.2)
Sodium: 133 mmol/L — ABNORMAL LOW (ref 134–144)
eGFR: 46 mL/min/1.73 — ABNORMAL LOW (ref >59)

## 2023-12-04 LAB — HEMOGLOBIN AND HEMATOCRIT, BLOOD
Hematocrit: 37.5 % (ref 34.0–46.6)
Hemoglobin: 12 g/dL (ref 11.1–15.9)

## 2023-12-15 DIAGNOSIS — J4520 Mild intermittent asthma, uncomplicated: Secondary | ICD-10-CM | POA: Diagnosis not present

## 2023-12-15 DIAGNOSIS — I10 Essential (primary) hypertension: Secondary | ICD-10-CM | POA: Diagnosis not present

## 2023-12-22 DIAGNOSIS — J449 Chronic obstructive pulmonary disease, unspecified: Secondary | ICD-10-CM | POA: Diagnosis not present

## 2023-12-22 DIAGNOSIS — M130 Polyarthritis, unspecified: Secondary | ICD-10-CM | POA: Diagnosis not present

## 2023-12-22 DIAGNOSIS — Z23 Encounter for immunization: Secondary | ICD-10-CM | POA: Diagnosis not present

## 2023-12-22 DIAGNOSIS — I10 Essential (primary) hypertension: Secondary | ICD-10-CM | POA: Diagnosis not present

## 2023-12-22 DIAGNOSIS — Z2821 Immunization not carried out because of patient refusal: Secondary | ICD-10-CM | POA: Diagnosis not present

## 2024-01-11 DIAGNOSIS — R519 Headache, unspecified: Secondary | ICD-10-CM | POA: Diagnosis not present

## 2024-01-11 DIAGNOSIS — Z79899 Other long term (current) drug therapy: Secondary | ICD-10-CM | POA: Diagnosis not present

## 2024-01-11 DIAGNOSIS — Z049 Encounter for examination and observation for unspecified reason: Secondary | ICD-10-CM | POA: Diagnosis not present

## 2024-01-17 ENCOUNTER — Ambulatory Visit

## 2024-01-17 DIAGNOSIS — I480 Paroxysmal atrial fibrillation: Secondary | ICD-10-CM

## 2024-01-19 LAB — CUP PACEART REMOTE DEVICE CHECK
Battery Remaining Longevity: 127 mo
Battery Voltage: 3.14 V
Brady Statistic AP VP Percent: 32.88 %
Brady Statistic AP VS Percent: 0 %
Brady Statistic AS VP Percent: 67.11 %
Brady Statistic AS VS Percent: 0.01 %
Brady Statistic RA Percent Paced: 32.86 %
Brady Statistic RV Percent Paced: 99.98 %
Date Time Interrogation Session: 20251006015620
Implantable Lead Connection Status: 753985
Implantable Lead Connection Status: 753985
Implantable Lead Implant Date: 20151019
Implantable Lead Implant Date: 20151019
Implantable Lead Location: 753859
Implantable Lead Location: 753860
Implantable Lead Model: 5076
Implantable Lead Model: 5076
Implantable Pulse Generator Implant Date: 20250218
Lead Channel Impedance Value: 285 Ohm
Lead Channel Impedance Value: 304 Ohm
Lead Channel Impedance Value: 380 Ohm
Lead Channel Impedance Value: 380 Ohm
Lead Channel Pacing Threshold Amplitude: 0.625 V
Lead Channel Pacing Threshold Amplitude: 1.125 V
Lead Channel Pacing Threshold Pulse Width: 0.4 ms
Lead Channel Pacing Threshold Pulse Width: 0.4 ms
Lead Channel Sensing Intrinsic Amplitude: 18 mV
Lead Channel Sensing Intrinsic Amplitude: 18 mV
Lead Channel Sensing Intrinsic Amplitude: 3.75 mV
Lead Channel Sensing Intrinsic Amplitude: 3.75 mV
Lead Channel Setting Pacing Amplitude: 1.5 V
Lead Channel Setting Pacing Amplitude: 2.25 V
Lead Channel Setting Pacing Pulse Width: 0.4 ms
Lead Channel Setting Sensing Sensitivity: 1.2 mV
Zone Setting Status: 755011
Zone Setting Status: 755011

## 2024-01-20 NOTE — Progress Notes (Signed)
 Remote PPM Transmission

## 2024-01-24 NOTE — Progress Notes (Signed)
 Remote PPM Transmission

## 2024-01-27 ENCOUNTER — Telehealth: Payer: Self-pay

## 2024-01-27 NOTE — Progress Notes (Signed)
   01/27/2024  Patient ID: Sarah Burns, female   DOB: 11-14-1924, 89 y.o.   MRN: [id number]  Contacted patient regarding referral for medication adherence from a quality report .   Left patient a voicemail to return my call at their convenience  Heather Factor, PharmD Clinical Pharmacist  [phone]

## 2024-04-17 ENCOUNTER — Ambulatory Visit

## 2024-04-19 LAB — CUP PACEART REMOTE DEVICE CHECK
Battery Remaining Longevity: 135 mo
Battery Voltage: 3.07 V
Brady Statistic AP VP Percent: 39.89 %
Brady Statistic AP VS Percent: 0 %
Brady Statistic AS VP Percent: 60.1 %
Brady Statistic AS VS Percent: 0.01 %
Brady Statistic RA Percent Paced: 39.87 %
Brady Statistic RV Percent Paced: 99.99 %
Date Time Interrogation Session: 20260105020015
Implantable Lead Connection Status: 753985
Implantable Lead Connection Status: 753985
Implantable Lead Implant Date: 20151019
Implantable Lead Implant Date: 20151019
Implantable Lead Location: 753859
Implantable Lead Location: 753860
Implantable Lead Model: 5076
Implantable Lead Model: 5076
Implantable Pulse Generator Implant Date: 20250218
Lead Channel Impedance Value: 304 Ohm
Lead Channel Impedance Value: 304 Ohm
Lead Channel Impedance Value: 361 Ohm
Lead Channel Impedance Value: 399 Ohm
Lead Channel Pacing Threshold Amplitude: 0.625 V
Lead Channel Pacing Threshold Amplitude: 1 V
Lead Channel Pacing Threshold Pulse Width: 0.4 ms
Lead Channel Pacing Threshold Pulse Width: 0.4 ms
Lead Channel Sensing Intrinsic Amplitude: 3.875 mV
Lead Channel Sensing Intrinsic Amplitude: 3.875 mV
Lead Channel Sensing Intrinsic Amplitude: 9.75 mV
Lead Channel Sensing Intrinsic Amplitude: 9.75 mV
Lead Channel Setting Pacing Amplitude: 1.5 V
Lead Channel Setting Pacing Amplitude: 2 V
Lead Channel Setting Pacing Pulse Width: 0.4 ms
Lead Channel Setting Sensing Sensitivity: 1.2 mV
Zone Setting Status: 755011
Zone Setting Status: 755011

## 2024-05-05 ENCOUNTER — Other Ambulatory Visit (HOSPITAL_COMMUNITY): Payer: Self-pay

## 2024-05-17 ENCOUNTER — Telehealth: Payer: Self-pay

## 2024-05-17 NOTE — Progress Notes (Signed)
 "  05/17/2024 Name: Sarah Burns MRN: [id number] DOB: 1925-01-08  Chief Complaint  Patient presents with   Medication Assistance    Jaslynn Thome is a 89 y.o. year old female who presented for a telephone visit.   They were referred to the pharmacist by their PCP for assistance in managing medication access.    Subjective: The patient presented with her daughter for PCP visit on 05/04/24. Patient reports affordability concerns with Farxiga and Breztri . PCP also mentioned costs issues with nebulizer treatments. I screened the patient for LIS in the office. We applied for LIS on 05/08/24. The patient reports only having a few remaining doses of Farxiga.   Care Team: Primary Care Provider: Benjamine Aland, MD ; Next Scheduled Visit: 05/25/24   Medication Access/Adherence  Current Pharmacy:  [id number] - [place], [place] - [address] AT [facility] [address] [place] [address] Phone: [phone] Fax: [phone]   Patient reports affordability concerns with their medications: Yes  Patient reports access/transportation concerns to their pharmacy: No  Patient reports adherence concerns with their medications:  No       Medication Assistance Application Summary:  Patient was outreached regarding medication assistance. Verified address, insurance, and income. Patient remains interested in PAP for Breztri  and Farxiga, no other new medications were identified for medication assistance.    Medication Assistance Findings:  Medication assistance needs identified: Breztri  and Farxiga     Additional medication assistance options reviewed with patient as warranted:  Applied for LIS on 05/08/24 No other options identified     Objective:  Medications Reviewed Today     Reviewed by Graylon Keen, [facility] (Pharmacist) on 05/17/24 at 1204  Med List Status: <None>   Medication Order Taking? Sig Documenting Provider Last Dose Status Informant  acetaminophen   (TYLENOL ) 650 MG CR tablet [id number] Yes Take 1,300 mg by mouth every 8 (eight) hours as needed for pain. [provider]  Active Child  albuterol  (PROVENTIL ) (2.5 MG/3ML) 0.083% nebulizer solution [id number] Yes Take 3 mLs (2.5 mg total) by nebulization every 4 (four) hours as needed for wheezing or shortness of breath. Bryn Bernardino NOVAK, MD  Active Child  albuterol  (VENTOLIN  HFA) 108 [phone] Base) MCG/ACT inhaler [id number] Yes Inhale 2 puffs into the lungs every 6 (six) hours as needed for wheezing or shortness of breath. [provider]  Active Child  amLODipine  (NORVASC ) 10 MG tablet [id number] Yes Take 10 mg by mouth daily. [provider]  Active Child  apixaban  (ELIQUIS ) 2.5 MG TABS tablet [id number] Yes Take 1 tablet (2.5 mg total) by mouth 2 (two) times daily. Tolia, Sunit, DO  Active   atorvastatin  (LIPITOR ) 80 MG tablet [id number] Yes Take 80 mg by mouth daily. [provider]  Active Child  BREZTRI  AEROSPHERE 160-9-4.8 MCG/ACT AERO [id number] Yes SMARTSIG:2 Puff(s) By Mouth Twice Daily Kara Dorn NOVAK, MD  Active Child  dapagliflozin propanediol (FARXIGA) 10 MG TABS tablet [id number] Yes Take 10 mg by mouth daily. [provider]  Active Child  guaiFENesin  (MUCINEX ) 600 MG 12 hr tablet [id number] Yes Take 1 tablet (600 mg total) by mouth 2 (two) times daily as needed. Jillian Buttery, MD  Active Child  Lactase (LACTAID PO) [id number]  Take 1 tablet by mouth as needed (when consuming dairy).  Patient not taking: Reported on 05/17/2024   [provider]  Active Child  losartan  (COZAAR ) 50 MG tablet [id number] Yes Take 50 mg by mouth daily. [provider]  Active  Child  memantine (NAMENDA) 10 MG tablet [id number] Yes Take 10 mg by mouth daily. [provider]  Active   montelukast  (SINGULAIR ) 10 MG tablet [id number] Yes Take 10 mg by mouth at bedtime. [provider]  Active Child  predniSONE  (DELTASONE ) 2.5 MG tablet [id number]  Yes Take 2.5 mg by mouth every morning. [provider]  Active            Plan: I will route patient assistance letter to pharmacy technician who will coordinate patient assistance program application process for medications listed above.  Pharmacy technician will assist with obtaining all required documents from both patient and provider(s) and submit application(s) once completed.    Thank you for allowing pharmacy to be a part of this patients care.  Heather Factor, PharmD Clinical Pharmacist  [phone]     "

## 2024-05-17 NOTE — Telephone Encounter (Signed)
 PAP: Patient assistance application for Breztri  Farxiga through [employer] ([employer]) has been mailed to pt's home address on file. Provider portion of application will be faxed to provider's office.  Provider portion has been faxed to Dr. Kennieth Leech at [facility]

## 2024-07-17 ENCOUNTER — Encounter
